# Patient Record
Sex: Male | Born: 1955 | Race: White | Hispanic: No | Marital: Married | Smoking: Never smoker
Health system: Southern US, Community
[De-identification: ages and names within clinical notes are randomized; demographics above are authoritative.]

---

## 2017-07-27 ENCOUNTER — Other Ambulatory Visit: Payer: Self-pay

## 2017-07-27 ENCOUNTER — Emergency Department (HOSPITAL_COMMUNITY)
Admission: EM | Admit: 2017-07-27 | Discharge: 2017-07-28 | Disposition: A | Discharge status: Home / Self Care | Payer: BLUE CROSS/BLUE SHIELD | Attending: Emergency Medicine | Admitting: Emergency Medicine

## 2017-07-27 ENCOUNTER — Emergency Department (HOSPITAL_COMMUNITY): Payer: BLUE CROSS/BLUE SHIELD

## 2017-07-27 DIAGNOSIS — N451 Epididymitis: Secondary | ICD-10-CM | POA: Diagnosis not present

## 2017-07-27 DIAGNOSIS — N50811 Right testicular pain: Secondary | ICD-10-CM | POA: Diagnosis present

## 2017-07-27 DIAGNOSIS — Z960 Presence of urogenital implants: Secondary | ICD-10-CM | POA: Insufficient documentation

## 2017-07-27 DIAGNOSIS — R300 Dysuria: Secondary | ICD-10-CM | POA: Diagnosis not present

## 2017-07-27 DIAGNOSIS — Z931 Gastrostomy status: Secondary | ICD-10-CM | POA: Insufficient documentation

## 2017-07-27 DIAGNOSIS — Z7901 Long term (current) use of anticoagulants: Secondary | ICD-10-CM | POA: Insufficient documentation

## 2017-07-27 DIAGNOSIS — I69320 Aphasia following cerebral infarction: Secondary | ICD-10-CM | POA: Diagnosis not present

## 2017-07-27 LAB — BASIC METABOLIC PANEL
Anion gap: 10 (ref 5–15)
BUN: 14 mg/dL (ref 6–20)
CO2: 20 mmol/L — ABNORMAL LOW (ref 22–32)
Calcium: 8.8 mg/dL — ABNORMAL LOW (ref 8.9–10.3)
Chloride: 107 mmol/L (ref 101–111)
Creatinine, Ser: 0.91 mg/dL (ref 0.61–1.24)
GFR calc Af Amer: >60 mL/min (ref >60)
GFR calc non Af Amer: >60 mL/min (ref >60)
Glucose, Bld: 101 mg/dL — ABNORMAL HIGH (ref 65–99)
Potassium: 3.6 mmol/L (ref 3.5–5.1)
Sodium: 137 mmol/L (ref 135–145)

## 2017-07-27 LAB — CBC WITH DIFFERENTIAL/PLATELET
Basophils Absolute: 0 10*3/uL (ref 0.0–0.1)
Basophils Relative: 0 %
Eosinophils Absolute: 0.4 10*3/uL (ref 0.0–0.7)
Eosinophils Relative: 5 %
HCT: 37.1 % — ABNORMAL LOW (ref 39.0–52.0)
Hemoglobin: 11.8 g/dL — ABNORMAL LOW (ref 13.0–17.0)
Lymphocytes Relative: 18 %
Lymphs Abs: 1.4 10*3/uL (ref 0.7–4.0)
MCH: 25.7 pg — ABNORMAL LOW (ref 26.0–34.0)
MCHC: 31.8 g/dL (ref 30.0–36.0)
MCV: 80.8 fL (ref 78.0–100.0)
Monocytes Absolute: 0.7 10*3/uL (ref 0.1–1.0)
Monocytes Relative: 9 %
Neutro Abs: 5.2 10*3/uL (ref 1.7–7.7)
Neutrophils Relative %: 68 %
Platelets: 251 10*3/uL (ref 150–400)
RBC: 4.59 MIL/uL (ref 4.22–5.81)
RDW: 15 % (ref 11.5–15.5)
WBC: 7.7 10*3/uL (ref 4.0–10.5)

## 2017-07-27 LAB — PROTIME-INR
INR: 2.39
Prothrombin Time: 25.9 seconds — ABNORMAL HIGH (ref 11.4–15.2)

## 2017-07-27 MED ORDER — HYDROCODONE-ACETAMINOPHEN 5-325 MG PO TABS
1.0000 | ORAL_TABLET | Freq: Once | ORAL | Status: AC
  Administered 2017-07-27: 1 via ORAL
  Filled 2017-07-27: qty 1

## 2017-07-27 NOTE — ED Provider Notes (Signed)
[facility]   CSN: [id number] Arrival date & time: 07/27/17  2028     History   Chief Complaint Chief Complaint  Patient presents with  . Groin Pain    HPI Tim Stephens is a 62 y.o. male presents for evaluation of right testicular pain and dysuria that has been ongoing for 2 days.  Wife states that pain worsened tonight, prompting ED visit.  Wife states that patient has been given Tylenol at the rehab center but with no improvement in pain.  He has not taking any other medications.  Wife has not noticed any penile discharge, hematuria.  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  Wife denies any fevers.   EM CAVEAT: Level 5 caveat due to patien'ts aphasia  The history is provided by the spouse. The history is limited by the condition of the patient.    No past medical history on file.  There are no active problems to display for this patient.  The histories are not reviewed yet. Please review them in the "History" navigator section and refresh this SmartLink.     Home Medications    Prior to Admission medications   Medication Sig Start Date End Date Taking? Authorizing Provider  HYDROcodone-acetaminophen (NORCO/VICODIN) 5-325 MG tablet Take 1-2 tablets by mouth every 6 (six) hours as needed. 07/28/17   Maxwell CaulLayden, Lindsey A, PA-C  levofloxacin (LEVAQUIN) 500 MG tablet Take 1 tablet (500 mg total) by mouth daily for 10 days. 07/28/17 08/07/17  Maxwell CaulLayden, Lindsey A, PA-C    Family History No family history on file.  Social History Social History   Tobacco Use  . Smoking status: Not on file  Substance Use Topics  . Alcohol use: Not on file  . Drug use: Not on file     Allergies   Patient has no allergy information on record.   Review of Systems Review of Systems  Unable to perform ROS: Other  Genitourinary: Positive for dysuria and testicular pain.      Physical Exam Updated Vital Signs BP 137/89   Pulse 74   Temp 98.5 F (36.9 C) (Oral)   Resp 18   SpO2 95%   Physical Exam  Constitutional: He appears well-developed and well-nourished.  HENT:  Head: Normocephalic and atraumatic.  Mouth/Throat: Oropharynx is clear and moist and mucous membranes are normal.  Eyes: Conjunctivae, EOM and lids are normal. Pupils are equal, round, and reactive to light.  Neck: Full passive range of motion without pain.  Cardiovascular: Normal rate, regular rhythm, normal heart sounds and normal pulses. Exam reveals no gallop and no friction rub.  No murmur heard. Pulmonary/Chest: Effort normal and breath sounds normal.  Abdominal: Soft. Normal appearance. There is no tenderness. There is no rigidity and no guarding. Hernia confirmed negative in the right inguinal area and confirmed negative in the left inguinal area.  PEG tube in place  Genitourinary: Penis normal. Right testis shows tenderness. Left testis shows no mass, no swelling and no tenderness. Circumcised.  Genitourinary Comments: Family member present for chaperone. Slight erythema noted to the scrotum. Tenderness to palpation of the right testicle.  Foley catheter in place which is actively draining urine.  No signs of hematuria.  Musculoskeletal: Normal range of motion.  Neurological: He is alert.  Skin: Skin is warm and dry. Capillary refill takes less than 2 seconds.  Psychiatric: He has a normal mood and affect.  His speech is normal.  Nursing note and vitals reviewed.    ED Treatments / Results  Labs (all labs ordered are listed, but only abnormal results are displayed) Labs Reviewed  URINALYSIS, ROUTINE W REFLEX MICROSCOPIC - Abnormal; Notable for the following components:      Result Value   APPearance HAZY (*)    Hgb urine dipstick LARGE (*)    Protein, ur 100 (*)    Nitrite POSITIVE (*)    Leukocytes, UA MODERATE (*)    Bacteria, UA MANY (*)    Squamous Epithelial /  LPF 0-5 (*)    All other components within normal limits  BASIC METABOLIC PANEL - Abnormal; Notable for the following components:   CO2 20 (*)    Glucose, Bld 101 (*)    Calcium 8.8 (*)    All other components within normal limits  CBC WITH DIFFERENTIAL/PLATELET - Abnormal; Notable for the following components:   Hemoglobin 11.8 (*)    HCT 37.1 (*)    MCH 25.7 (*)    All other components within normal limits  PROTIME-INR - Abnormal; Notable for the following components:   Prothrombin Time 25.9 (*)    All other components within normal limits    EKG  EKG Interpretation None       Radiology US Scrotum  Result Date: 07/28/2017 CLINICAL DATA:  62 y/o M; genital pain and right testicular pain for 2 days. EXAM: SCROTAL ULTRASOUND DOPPLER ULTRASOUND OF THE TESTICLES TECHNIQUE: Complete ultrasound examination of the testicles, epididymis, and other scrotal structures was performed. Color and spectral Doppler ultrasound were also utilized to evaluate blood flow to the testicles. COMPARISON:  None. FINDINGS: Right testicle Measurements: 5.3 x 2.1 x 3.5 cm. No mass or microlithiasis visualized. Left testicle Measurements: 5.3 x 1.9 x 3.3 cm. No mass or microlithiasis visualized. Tubular structure lateral to the testicle without internal blood flow, likely vas deferens, incidentally noted. Right epididymis: Heterogeneity and enlargement of the tail with increased vascularity. Simple cyst measuring up to 4 mm. Left epididymis: Normal in size and appearance. Simple cyst measuring up to 2 mm. Hydrocele:  Trace left hydrocele. Varicocele:  None. Pulsed Doppler interrogation of both testes demonstrates normal low resistance arterial and venous waveforms bilaterally. IMPRESSION: 1. No findings of testicular torsion or orchitis at this time. 2. Heterogeneity and enlargement of the tail of right epididymis with increased blood flow, likely epididymitis. Electronically Signed   By: Mitzi Hansen  M.D.   On: 07/28/2017 01:21   [place] Art/ven Flow Abd Pelv Doppler  Result Date: 07/28/2017 CLINICAL DATA:  62 y/o M; genital pain and right testicular pain for 2 days. EXAM: SCROTAL ULTRASOUND DOPPLER ULTRASOUND OF THE TESTICLES TECHNIQUE: Complete ultrasound examination of the testicles, epididymis, and other scrotal structures was performed. Color and spectral Doppler ultrasound were also utilized to evaluate blood flow to the testicles. COMPARISON:  None. FINDINGS: Right testicle Measurements: 5.3 x 2.1 x 3.5 cm. No mass or microlithiasis visualized. Left testicle Measurements: 5.3 x 1.9 x 3.3 cm. No mass or microlithiasis visualized. Tubular structure lateral to the testicle without internal blood flow, likely vas deferens, incidentally noted. Right epididymis: Heterogeneity and enlargement of the tail with increased vascularity. Simple cyst measuring up to 4 mm. Left epididymis: Normal in size and appearance. Simple cyst measuring up to 2 mm. Hydrocele:  Trace left hydrocele. Varicocele:  None. Pulsed Doppler interrogation of both testes demonstrates normal low resistance arterial and venous waveforms bilaterally. IMPRESSION: 1. No findings of testicular  torsion or orchitis at this time. 2. Heterogeneity and enlargement of the tail of right epididymis with increased blood flow, likely epididymitis. Electronically Signed   By: Mitzi Hansen M.D.   On: 07/28/2017 01:21    Procedures Procedures (including critical care time)  Medications Ordered in ED Medications  levofloxacin (LEVAQUIN) tablet 500 mg (not administered)  HYDROcodone-acetaminophen (NORCO/VICODIN) 5-325 MG per tablet 1 tablet (1 tablet Oral Given 07/27/17 2327)     Initial Impression / Assessment and Plan / ED Course  I have reviewed the triage vital signs and the nursing notes.  Pertinent labs & imaging results that were available during my care of the patient were reviewed by me and considered in my medical decision  making (see chart for details).     62 year old male past medical history of CVA order right General pain times 3 days.  Wife reports that patient was having some burning with urination and some right groin pain.  She noted today she saw some redness of the right scrotum, prompting ED visit.  Wife denies any fevers. Patient is afebrile, non-toxic appearing, sitting comfortably on examination table. Vital signs reviewed and stable.  On exam, patient does have some erythema noted to the right scrotum.  He does have tenderness to palpation to the right scrotum.  No evidence of hernia.  Scrotum is not swollen or evidence of mass.  Otherwise unremarkable exam.  Plan for basic labs, UA and ultrasound of scrotum.  Consider UTI versus epididymitis versus orchitis.  History/physical exam are not concerning for testicular torsion or hernia.  Labs and imaging reviewed.  BMP shows bicarb of 20.  Otherwise unremarkable.  CBC shows no evidence of leukocytosis.  PT 25.9.  INR is 2.39.  UA shows evidence of nitrates, leukocytes, pyuria.  Ultrasound of scrotum reveals no evidence of testicular torsion but there is concern about right-sided epididymitis.  Discussed results with wife.  Given concerns for UTI and epididmytisi, will plan to start patient on antibiotic.  Discussed with pharmacy regarding antibiotic choice.  Patient is on Coumadin currently.  He recommends doing 500 mg of Levaquin and having patient's INR checked on 1/21.  Updated wife on plan.  She is agreeable.  We will plan to start on antibiotic therapy tonight. Patient is resting comfortably in the ED. Vitals stable. Patient stable for discharge at this time. Wife had ample opportunity for questions and discussion. All patient's questions were answered with full understanding. Strict return precautions discussed. Wife expresses understanding and agreement to plan.     Final Clinical Impressions(s) / ED Diagnoses   Final diagnoses:  Epididymitis     ED Discharge Orders        Ordered    levofloxacin (LEVAQUIN) 500 MG tablet  Daily     07/28/17 0209    HYDROcodone-acetaminophen (NORCO/VICODIN) 5-325 MG tablet  Every 6 hours PRN     07/28/17 0209       Maxwell Caul, PA-C 07/28/17 1458    Eber Hong, MD 07/28/17 1500

## 2017-07-27 NOTE — ED Triage Notes (Signed)
Pt arrives to ED from rehab center with complaints of genital pain since 2 days. EMS reports pt is recovering from stroke in November of 2018 and has residual right sided paralysis and aphasia. Pt is alert and oriented, urinary foley last changed yesterday. Pt placed in position of comfort with bed locked and lowered, call bell in reach.

## 2017-07-27 NOTE — ED Notes (Signed)
Patient transported to Ultrasound 

## 2017-07-28 LAB — URINALYSIS, ROUTINE W REFLEX MICROSCOPIC
Bilirubin Urine: NEGATIVE
Glucose, UA: NEGATIVE mg/dL
Ketones, ur: NEGATIVE mg/dL
Nitrite: POSITIVE — AB
Protein, ur: 100 mg/dL — AB
Specific Gravity, Urine: 1.025 (ref 1.005–1.030)
pH: 6 (ref 5.0–8.0)

## 2017-07-28 MED ORDER — LEVOFLOXACIN 500 MG PO TABS
500.0000 mg | ORAL_TABLET | Freq: Once | ORAL | Status: AC
  Administered 2017-07-28: 500 mg via ORAL
  Filled 2017-07-28: qty 1

## 2017-07-28 MED ORDER — HYDROCODONE-ACETAMINOPHEN 5-325 MG PO TABS: 1.0000 | ORAL_TABLET | Freq: Four times a day (QID) | ORAL | 0 refills | Status: DC | PRN

## 2017-07-28 MED ORDER — LEVOFLOXACIN 500 MG PO TABS: 500.0000 mg | ORAL_TABLET | Freq: Every day | ORAL | 0 refills | Status: AC

## 2017-07-28 NOTE — Discharge Instructions (Signed)
Take antibiotics as directed. Please take all of your antibiotics until finished.  He can take pain medication as directed for severe or break through pain.   As we discussed, his INR will need to be evaluated on 07/31/16.  Follow-up with primary care doctor in the next 24-48 hours for further evaluation.

## 2017-10-05 ENCOUNTER — Ambulatory Visit: Payer: BLUE CROSS/BLUE SHIELD | Admitting: Speech Pathology

## 2017-10-05 ENCOUNTER — Ambulatory Visit: Payer: BLUE CROSS/BLUE SHIELD | Attending: Family Medicine | Admitting: Physical Therapy

## 2017-10-05 ENCOUNTER — Encounter: Payer: Self-pay | Admitting: Speech Pathology

## 2017-10-05 DIAGNOSIS — R293 Abnormal posture: Secondary | ICD-10-CM | POA: Insufficient documentation

## 2017-10-05 DIAGNOSIS — R4701 Aphasia: Secondary | ICD-10-CM | POA: Diagnosis present

## 2017-10-05 DIAGNOSIS — R2681 Unsteadiness on feet: Secondary | ICD-10-CM | POA: Diagnosis present

## 2017-10-05 DIAGNOSIS — R482 Apraxia: Secondary | ICD-10-CM | POA: Insufficient documentation

## 2017-10-05 DIAGNOSIS — R2689 Other abnormalities of gait and mobility: Secondary | ICD-10-CM | POA: Diagnosis present

## 2017-10-05 DIAGNOSIS — M6281 Muscle weakness (generalized): Secondary | ICD-10-CM | POA: Diagnosis present

## 2017-10-05 NOTE — Patient Instructions (Signed)
  Continue to practicing printing on the while board name, family names  Continue practicing sounds, blow, kiss, horse, whistle  Continue counting to 20, days of week, months  Try some reading use body parts, put items or pictures on the table  Also use verbal descriptions - point to the one that we tell time with (use a short description) for auditory comprehension or write description for reading comprehension  Happy birthday, Jingle bells, Take me out to the Ballgame, row row your boat, Twinkle,

## 2017-10-05 NOTE — Therapy (Signed)
[facility] [address] [place], [place], [address] Phone: [phone]   Fax:  [phone]  Speech Language Pathology Evaluation  Patient Details  Name: Tim Stephens MRN: [id number] Date of Birth: [birth year] Referring Provider: Dr. Jenita Seashore   Encounter Date: 10/05/2017  End of Session - 10/05/17 1218    Visit Number  1    Number of Visits  34    Date for SLP Re-Evaluation  12/29/17    SLP Start Time  1104    SLP Stop Time   1155    SLP Time Calculation (min)  51 min    Activity Tolerance  Patient tolerated treatment well       History reviewed. No pertinent past medical history.  History reviewed. No pertinent surgical history.  There were no vitals filed for this visit.      SLP Evaluation [facility] - 10/05/17 1202      General Information   HPI  Tim Stephens is a 62 year old right hand dominant male with past medical history significant for hypertension and hyperlipidemia who was admitted to [facility] on 05/30/2017 with a aphasia and right hemibody weakness. CTA was obtained and revealed left M1 cutoff. He was taken to IR by Dr. Lionel December for cerebral angiography with mechanical thrombectomy. The patient's acute hospital course was complicated by worsening cytotoxic edema as well areas of hemorrhagic conversion with left to right midline shift. He was taken to the OR on 06/01/2017 for decompressive left hemicraniectomy. [name] He was found to have a right peroneal, right posterior tibial, right gastrocnemius,left basilic, and left axillary venous thrombi. He then developed totally occluding superficial thrombophlebitis of the left cephalic vein and right cephalic vein. Venous thrombus then progressed in the left upper arm to the left brachial vein. The patient was not anticoagulated due to recent hemorrhagic conversion of stroke. He underwent serial scans to evaluate for progression of venous thrombosis. PEG was  placed by Dr. Allayne Butcher on 06/15/2017. There was progression of right lower extremity DVT from the posterior tibial vein to the right common femoral vein. Vascular surgery was consulted and placed an inferior vena cava filter on 06/18/2017.   He was d/c'd from acute care to  [facility] in December 2018, and was d/c'd from inpt rehab on  07/30/17. At that time, pt was placed in SNF for further therapies, d/c'd 08/22/17 with HH PT/OT/ST. At this time, pt is on regular diet/thin liquids. Plans to repelace skull in next few weeks. Pt with helmet.    Mobility Status  wheelchair      Balance Screen   Has the patient fallen in the past 6 months  No    Has the patient had a decrease in activity level because of a fear of falling?   Yes    Is the patient reluctant to leave their home because of a fear of falling?   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      Auditory Comprehension   Overall Auditory Comprehension  Impaired    Yes/No Questions  Impaired    Basic Immediate Environment Questions  75-100% accurate  Complex Questions  50-74% accurate    Commands  Impaired    One Step Basic Commands  50-74% accurate    Two Step Basic Commands  50-74% accurate    Conversation  Simple    EffectiveTechniques  Extra processing time;Pausing;Repetition;Slowed speech;Stressing words;Visual/Gestural cues      Reading Comprehension   Reading Status  Impaired    Word level  76-100% accurate    Sentence Level  26-50% accurate    Paragraph Level  Not tested      Expression   Primary Mode of Expression  Verbal      Verbal Expression   Overall Verbal Expression  Impaired    Initiation  Impaired    Automatic Speech  Counting     Level of Generative/Spontaneous Verbalization  Word    Repetition  Impaired    Level of Impairment  Word level    Naming  Impairment    Responsive  Not tested    Confrontation  0-24% accurate    Convergent  0-24% accurate    Divergent  Not tested    Verbal Errors  Inconsistent;Perseveration;Neologisms;Aware of errors    Pragmatics  No impairment    Effective Techniques  Phonemic cues;Articulatory cues;Written cues;Sentence completion      Written Expression   Dominant Hand  Right    Written Expression  Exceptions to [facility]    Trace Ability  Letter;Word    Copy Ability  Word    Dictation Ability  Word name only      Oral Motor/Sensory Function   Overall Oral Motor/Sensory Function  Appears within functional limits for tasks assessed    Overall Oral Motor/Sensory Function  assessment affected by oral and verbal apraxia. Pt eating without oral phase difficulites      Motor Speech   Overall Motor Speech  Impaired    Respiration  Within functional limits    Phonation  Normal    Resonance  Within functional limits    Articulation  Impaired    Level of Impairment  Word    Intelligibility  Intelligibility reduced    Word  0-24% accurate    Phrase  0-24% accurate    Sentence  Not tested    Motor Planning  Impaired    Level of Impairment  Word    Motor Speech Errors  Groping for words;Inconsistent perseveration    Effective Techniques  Slow rate                      SLP Education - 10/05/17 1217    Education provided  Yes    Education Details  Home acticvities for speech and language; possible use of AAC device, auditory comprehension impairment    Person(s) Educated  Patient;Spouse    Methods  Explanation;Demonstration;Verbal cues;Handout    Comprehension  Verbalized understanding;Verbal cues required;Need further instruction       SLP Short Term Goals - 10/05/17 1221      SLP SHORT TERM GOAL #1   Title  Pt will ID object f:5 to sentence description with 90%  accuracy and occasional min A    Time  6    Period  Weeks    Status  New      SLP SHORT TERM GOAL #2   Title  Pt will complete automatic speech tasks with 70% accuracy and visual cues over 3 sessions    Time  6    Period  Weeks    Status  New  SLP SHORT TERM GOAL #3   Title  Pt will utilize multimodal communication and AAC to communicate wants and answer questions with 70% accuracy and occasional min A over 3 sessions    Time  6    Period  Weeks    Status  New      SLP SHORT TERM GOAL #4   Title  Pt will write/type at word level with 80% accuracy and occasional min A over 2 sessions    Time  6    Period  Weeks    Status  New       SLP Long Term Goals - 10/05/17 1543      SLP LONG TERM GOAL #1   Title  Pt will comprehend mildly complex conversation over 8 minute conversation with appropriate responses (mulitmodal) and 2 or less requests for repetition    Time  12    Period  Weeks    Status  New      SLP LONG TERM GOAL #2   Title  Pt will utilize multimodal communication/ACC to produce 2 word response to conversation/question 8/10x with occasional min A    Time  12    Period  Weeks    Status  New      SLP LONG TERM GOAL #3   Title  Pt will write 2 word phrases with 80% accuracy and occasional min A    Time  12    Period  Weeks    Status  New      SLP LONG TERM GOAL #4   Title  Pt will produce 1-2 syllable words in phrase completion task with 70% accuracy and occassional mod A    Time  12    Period  Weeks    Status  New       Plan - 10/05/17 1534    Clinical Impression Statement  Tim Stephens, 62 y.o. male hospitalized from 05/30/17 to 08/22/17 due to large left CVA. 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 Auditory comprehension is intact to simple yes/no questions and simple 1 step commands. He was 60% accurate with complex yes/no questions and frequently requested  repetition. Reading comprehension intact to word level 70% accuracy. Phrase level reading was 0/3. Automatic speech is intact 60% to count 1-10, with perseveration, articulatory errors and halting. Pt did not complete days of the week in unison, with visual and placement cues. Repetition in is impaired at word level. Naming difficulty to assess due to verbal apraxia, however confrontation naming was less than 50%. Sentence completion cues, phonemic cues, semantic cues rarely successful. Written expression intact to copying family names, writing his name with extended time. When asked how they are communicating at home, his wife responded "I'm reading his mind and he gets frustrated" I recommend skilled ST to maximize communication, at this time, will continue traditional therapy and ongoing assessment for AAC device/multimodal communication.     Duration  -- 12 weeks or total of 35 visits - see frerquency comment    Treatment/Interventions  Language facilitation;Environmental controls;Cueing hierarchy;Oral motor exercises;SLP instruction and feedback;Compensatory strategies;Functional tasks;Cognitive reorganization;Internal/external aids;Multimodal communcation approach;Patient/family education    Potential to Achieve Goals  Fair    Potential Considerations  Severity of impairments       Patient will benefit from skilled therapeutic intervention in order to improve the following deficits and impairments:   Aphasia - Plan: SLP plan of  care cert/re-cert  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   Melany Wiesman, Radene JourneyLaura Ann MS, CCC-SLP 10/05/2017, 3:55 PM  [facility] [address] [place], [place], [address] Phone: [phone]   Fax:  [phone]  Name: Tim BrackenJames M Stephens MRN: [id number] Date of Birth: 03/08/1956

## 2017-10-06 ENCOUNTER — Encounter: Payer: Self-pay | Admitting: Physical Therapy

## 2017-10-06 NOTE — Therapy (Signed)
[facility] [address] [place], [place], [address] Phone: [phone]   Fax:  [phone]  Physical Therapy Evaluation  Patient Details  Name: Tim Stephens MRN: [id number] Date of Birth: 1956/06/29 Referring Provider: Jenita Seashore MD   Encounter Date: 10/05/2017  PT End of Session - 10/06/17 0826    Visit Number  1    Number of Visits  33    Date for PT Re-Evaluation  01/03/18    Authorization Type  [employer]-30 visit limit for each-awaiting complete PT auth    PT Start Time  1017    PT Stop Time  1102    PT Time Calculation (min)  45 min    Equipment Utilized During Treatment  Gait belt    Activity Tolerance  Patient tolerated treatment well    Behavior During Therapy  [facility] for tasks assessed/performed       History reviewed. No pertinent past medical history.  History reviewed. No pertinent surgical history.  There were no vitals filed for this visit.   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  He was in ICU for several weeks, with R side affected hemiplegia.  He had craniectomy on L side of skull and wears helmet for therapy.  He has progressed to walking with hemiwalker for about 25 ft.      Patient is accompained by:  Family member wife    Pertinent History  Tim Stephens is a 15 year old right hand dominant male with past medical history significant for hypertension and hyperlipidemia who was admitted to [facility] on 05/30/2017 with a aphasia and right hemibody weakness. CTA was obtained and revealed left M1 cutoff. He was taken to IR by Dr. Lionel December for cerebral angiography with mechanical thrombectomy. The patient's acute hospital course was complicated by worsening cytotoxic edema as well areas of hemorrhagic conversion with left to right midline shift. He was taken to the OR on 06/01/2017 for decompressive left hemicraniectomy. [name] He was  found to have a right peroneal, right posterior tibial, right gastrocnemius,left basilic, and left axillary venous thrombi. He then developed totally occluding superficial thrombophlebitis of the left cephalic vein and right cephalic vein. Venous thrombus then progressed in the left upper arm to the left brachial vein. The patient was not anticoagulated due to recent hemorrhagic conversion of stroke. He underwent serial scans to evaluate for progression of venous thrombosis. PEG was placed by Dr. Allayne Butcher on 06/15/2017. There was progression of right lower extremity DVT from the posterior tibial vein to the right common femoral vein. Vascular surgery was consulted and placed an inferior vena cava filter on 06/18/2017. expressive aphasia, verbal apraxia; therapy at [facility] January 2019, then [facility] rehab for 4 weeks, then [facility] therapy PT, OT, speech discharged 09/21/17    Patient Stated Goals  Per wife:  to get strong enough to get upstairs, and use the bathroom on his own.    Currently in Pain?  No/denies         [facility] PT Assessment - 10/06/17 0001      Assessment   Medical Diagnosis  CVA with R hemiparesis    Referring Provider  Jenita Seashore MD    Onset Date/Surgical Date  05/30/17    Hand Dominance  Right      Precautions   Precautions  Fall;Other (comment)    Precaution Comments  Helmet with therapy and out  of bed activities; recommended to check BP prior to standing activities due to wife reported possible seizure history; DVT R calf (on Coumadin) and s/p IVC filter placement Regular diet, thin liquids      Balance Screen   Has the patient fallen in the past 6 months  No    Has the patient had a decrease in activity level because of a fear of falling?   No    Is the patient reluctant to leave their home because of a fear of falling?   No      Home Public house manager residence    Living Arrangements  Spouse/significant other Wife providing 24/7 care     Available Help at Discharge  Family    Type of Home  House    Home Access  Ramped entrance through garage    Home Layout  Two level;1/2 bath on main level;Bed/bath upstairs    Home Equipment  Hospital bed;Wheelchair - Optician, dispensing; uses urinal      Prior Function   Level of Independence  Independent    Vocation  Full time employment    Radiation protection practitioner, In charge of laboratories    Leisure  Enjoyed walking around neighborhood, fishing, hunting      Observation/Other Assessments   Focus on Therapeutic Outcomes (FOTO)   NA-Staff did not capture      Sensation   Light Touch  Impaired by gross assessment    Proprioception  Appears Intact      Posture/Postural Control   Posture/Postural Control  Postural limitations    Postural Limitations  Rounded Shoulders      Strength   Overall Strength  Deficits    Right Hip Flexion  3-/5    Left Hip Flexion  5/5    Right Knee Flexion  3-/5    Right Knee Extension  3-/5    Left Knee Flexion  5/5    Left Knee Extension  5/5    Right Ankle Dorsiflexion  1/5    Left Ankle Dorsiflexion  5/5      Bed Mobility   Bed Mobility  Not assessed Wife reports mod independent bed mobility      Transfers   Transfers  Sit to Stand;Stand to Sit    Sit to Stand  4: Min assist;With upper extremity assist;From chair/3-in-1;With armrests from wheelchair    Sit to Stand Details  Verbal cues for sequencing;Verbal cues for technique VCs for hand placement    Stand to Sit  4: Min assist;With upper extremity assist;With armrests;To chair/3-in-1 to wheelchair    Stand to Sit Details (indicate cue type and reason)  Verbal cues for sequencing;Verbal cues for technique VCs for hand placement.      Ambulation/Gait   Ambulation/Gait  Yes    Ambulation/Gait Assistance  4: Min guard    Ambulation Distance (Feet)  25 Feet    Assistive device  Hemi-walker    Gait Pattern  Step-to pattern;Decreased step length - right;Decreased  stance time - right;Decreased dorsiflexion - right;Right genu recurvatum;Lateral trunk lean to left;Poor foot clearance - right    Gait velocity  Unable to assess       Balance   Balance Assessed  Yes      Static Sitting Balance   Static Sitting - Balance Support  No upper extremity supported    Static Sitting - Level of Assistance  6: Modified independent (Device/Increase time)    Static Sitting -  Comment/# of Minutes  30-60 sec      Static Standing Balance   Static Standing - Balance Support  No upper extremity supported    Static Standing - Level of Assistance  5: Stand by assistance    Static Standing - Comment/# of Minutes  30 seconds with very close supervision      Standardized Balance Assessment   Standardized Balance Assessment  Berg Balance Test      Berg Balance Test   Sit to Stand  Able to stand using hands after several tries    Standing Unsupported  Needs several tries to stand 30 seconds unsupported    Sitting with Back Unsupported but Feet Supported on Floor or Stool  Able to sit 2 minutes under supervision    Stand to Sit  Controls descent by using hands    Transfers  Needs two people to assist of supervise to be safe    Standing Unsupported with Eyes Closed  Needs help to keep from falling    Standing Ubsupported with Feet Together  Needs help to attain position and unable to hold for 15 seconds    From Standing, Reach Forward with Outstretched Arm  Loses balance while trying/requires external support    From Standing Position, Pick up Object from Floor  Unable to try/needs assist to keep balance    From Standing Position, Turn to Look Behind Over each Shoulder  Needs assist to keep from losing balance and falling    Turn 360 Degrees  Needs assistance while turning    Standing Unsupported, Alternately Place Feet on Step/Stool  Needs assistance to keep from falling or unable to try    Standing Unsupported, One Foot in [employer] balance while stepping or standing     Standing on One Leg  Unable to try or needs assist to prevent fall    Total Score  9    Berg comment:  Scores <45/56 indicate increased fall risk              No data recorded  Objective measurements completed on examination: See above findings.                PT Short Term Goals - 10/06/17 0841      PT SHORT TERM GOAL #1   Title  Pt will perform HEP with wife's supervision for improved balance, strength, gait.  TARGET 11/03/17    Time  4    Period  Weeks    Status  New    Target Date  11/03/17      PT SHORT TERM GOAL #2   Title  Pt will perform sit to stand with supervision, for improved transfer efficiency and safety with gait.    Time  4    Period  Weeks    Status  New    Target Date  11/03/17      PT SHORT TERM GOAL #3   Title  Pt will ambulate at least 100 ft using hemiwalker with minimal assistance for improved gait safety and household mobility.    Time  4    Period  Weeks    Status  New    Target Date  11/03/17      PT SHORT TERM GOAL #4   Title  Stand pivot transfer to be assessed, with goal to be written as appropriate (as wife is currently assisting at home-did not assess at eval)    Time  4    Period  Weeks    Status  New    Target Date  11/03/17      PT SHORT TERM GOAL #5   Title  Pt/wife will verbalize understanding of fall prevention in home environment.    Time  4    Period  Weeks    Status  New    Target Date  11/03/17      Additional Short Term Goals   Additional Short Term Goals  Yes      PT SHORT TERM GOAL #6   Title  Berg score to increase by at least 8 points, for improved standing balance and decreased fall risk.    Time  4    Period  Weeks    Status  New    Target Date  11/03/17        PT Long Term Goals - 10/06/17 1317      PT LONG TERM GOAL #1   Title  Pt/wife will verbalize plans for continued community fitness upon d/c from PT.  TARGET 12/29/17    Time  12    Period  Weeks    Status  New    Target Date   12/29/17      PT LONG TERM GOAL #2   Title  Pt will perform sit to stand transfers modified independently, for improved safety and independence with transfers.    Time  12    Period  Weeks    Status  New    Target Date  12/29/17      PT LONG TERM GOAL #3   Title  Pt will ambulate at least 500 ft using least restrictive assistive device, with supervision, for improved safety and independence with gait.    Time  12    Period  Weeks    Status  New    Target Date  12/29/17      PT LONG TERM GOAL #4   Title  GAit velocity to be assessed, with goal to be set as appropriate.    Time  12    Period  Weeks    Status  New    Target Date  12/29/17      PT LONG TERM GOAL #5   Title  Pt will negotiate 12 steps using handrail, with supervision, for improved safety and independence with stair negotiation for home.    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  [facility], followed by [facility], then HHPT.  PMH included HTN, hyperlipidemia.  Pt presents with R hemiplegia, decreased muscle strength RLE and trunk, decreased balance, decreased independence with transfers, decreased independence with gait, decreased safety awareness, aphasia and verbal apraxia.  Prior to CVA, pt was independent and worked as Herbalistchemist and supervisor of labs; currently, pt is requiring 24-hr care and supervision.  Pt would benefit from skilled PT to address the above stated deficits and to decrease fall risk, improve functional mobility and independence.    History and Personal Factors relevant to plan of care:  PMH  includes HTN, hyperlipidemia;  significant hospital course following CVA; >3 systems involved; current DVT on prophylaxis and IVC filter placement; s/p hemicraniectomy awaiting surgical flap replacement     Clinical Presentation  Unstable    Clinical Presentation due to:  significant hospital course, fall risk and current co-morbidities    Clinical Decision Making  High    Rehab Potential  Good    Clinical Impairments Affecting Rehab Potential  Good family support; severity of deficits    PT Frequency  Other (comment) 2x/wk for 4 weeks, then 3x/wk for 8 weeks    PT Duration  12 weeks    PT Treatment/Interventions  ADLs/Self Care Home Management;DME Instruction;Gait training;Stair training;Functional mobility training;Therapeutic activities;Therapeutic exercise;Balance training;Orthotic Fit/Training;Patient/family education;Neuromuscular re-education    PT Next Visit Plan  Initiate HEP for trunk, lower extremity strengthening, transfer, and gait training    Consulted and Agree with Plan of Care  Patient;Family member/caregiver    Family Member Consulted  wife       Patient will benefit from skilled therapeutic intervention in order to improve the following deficits and impairments:  Abnormal gait, Decreased activity tolerance, Decreased balance, Decreased knowledge of precautions, Decreased endurance, Decreased knowledge of use of DME, Decreased mobility, Decreased safety awareness, Difficulty walking, Decreased strength, Impaired tone, Postural dysfunction  Visit Diagnosis: Muscle weakness (generalized)  Unsteadiness on feet  Other abnormalities of gait and mobility  Abnormal posture     Problem List There are no active problems to display for this patient.   Gean Maidens 10/06/2017, 1:29 PM  Gean Maidens., PT   [facility] [address] [place], [place], [address] Phone: [phone]   Fax:  [phone]  Name: Tim Stephens MRN:  [id number] Date of Birth: 01/06/1956

## 2017-10-09 ENCOUNTER — Encounter (HOSPITAL_COMMUNITY): Payer: Self-pay | Admitting: Emergency Medicine

## 2017-10-09 ENCOUNTER — Encounter: Payer: Self-pay | Admitting: Occupational Therapy

## 2017-10-09 ENCOUNTER — Ambulatory Visit: Payer: BLUE CROSS/BLUE SHIELD | Attending: Family Medicine | Admitting: Occupational Therapy

## 2017-10-09 ENCOUNTER — Emergency Department (HOSPITAL_COMMUNITY)
Admission: EM | Admit: 2017-10-09 | Discharge: 2017-10-09 | Disposition: A | Discharge status: Home / Self Care | Payer: BLUE CROSS/BLUE SHIELD | Attending: Emergency Medicine | Admitting: Emergency Medicine

## 2017-10-09 ENCOUNTER — Emergency Department (HOSPITAL_COMMUNITY): Payer: BLUE CROSS/BLUE SHIELD

## 2017-10-09 DIAGNOSIS — R569 Unspecified convulsions: Secondary | ICD-10-CM | POA: Diagnosis present

## 2017-10-09 DIAGNOSIS — R2681 Unsteadiness on feet: Secondary | ICD-10-CM | POA: Diagnosis present

## 2017-10-09 DIAGNOSIS — G40909 Epilepsy, unspecified, not intractable, without status epilepticus: Secondary | ICD-10-CM | POA: Diagnosis not present

## 2017-10-09 DIAGNOSIS — I69318 Other symptoms and signs involving cognitive functions following cerebral infarction: Secondary | ICD-10-CM | POA: Diagnosis present

## 2017-10-09 DIAGNOSIS — R482 Apraxia: Secondary | ICD-10-CM

## 2017-10-09 DIAGNOSIS — R293 Abnormal posture: Secondary | ICD-10-CM

## 2017-10-09 DIAGNOSIS — Z79899 Other long term (current) drug therapy: Secondary | ICD-10-CM | POA: Diagnosis not present

## 2017-10-09 DIAGNOSIS — Z7901 Long term (current) use of anticoagulants: Secondary | ICD-10-CM | POA: Insufficient documentation

## 2017-10-09 DIAGNOSIS — R29818 Other symptoms and signs involving the nervous system: Secondary | ICD-10-CM | POA: Insufficient documentation

## 2017-10-09 DIAGNOSIS — M6281 Muscle weakness (generalized): Secondary | ICD-10-CM | POA: Diagnosis present

## 2017-10-09 DIAGNOSIS — R29898 Other symptoms and signs involving the musculoskeletal system: Secondary | ICD-10-CM | POA: Diagnosis present

## 2017-10-09 DIAGNOSIS — I69351 Hemiplegia and hemiparesis following cerebral infarction affecting right dominant side: Secondary | ICD-10-CM | POA: Insufficient documentation

## 2017-10-09 LAB — PROTIME-INR
INR: 2.85
Prothrombin Time: 29.7 seconds — ABNORMAL HIGH (ref 11.4–15.2)

## 2017-10-09 LAB — I-STAT CHEM 8, ED
BUN: 15 mg/dL (ref 6–20)
Calcium, Ion: 1.07 mmol/L — ABNORMAL LOW (ref 1.15–1.40)
Chloride: 104 mmol/L (ref 101–111)
Creatinine, Ser: 1 mg/dL (ref 0.61–1.24)
Glucose, Bld: 97 mg/dL (ref 65–99)
HCT: 45 % (ref 39.0–52.0)
Hemoglobin: 15.3 g/dL (ref 13.0–17.0)
Potassium: 3.7 mmol/L (ref 3.5–5.1)
Sodium: 140 mmol/L (ref 135–145)
TCO2: 24 mmol/L (ref 22–32)

## 2017-10-09 NOTE — ED Notes (Signed)
Pt verbalized understanding discharge instructions and denies any further needs or questions at this time. VS stable, ambulatory and steady gait.   

## 2017-10-09 NOTE — ED Notes (Signed)
Requisition for PT-INR sent to main lab. No sunquest labels available for hall pt

## 2017-10-09 NOTE — ED Provider Notes (Signed)
[facility] EMERGENCY DEPARTMENT Provider Note   CSN: [id number] Arrival date & time: 10/09/17  1246     History   Chief Complaint Chief Complaint  Patient presents with  . Seizures    HPI Tim Stephens is a 62 y.o. male.  Patient had generalized seizure today approximately 1 hour prior to arrival.  Lasted 3 minutes according to his wife followed by drowsiness lasting several minutes.  He is now at baseline.  He denies pain anywhere.  He looks at his normal self per his wife and feels normal.  No treatment prior to coming here.  Denies noncompliance with Keppra.  Nothing makes symptoms better or worse.  No other associated symptoms  HPI  History reviewed. No pertinent past medical history.  There are no active problems to display for this patient.   History reviewed. No pertinent surgical history.      Home Medications    Prior to Admission medications   Medication Sig Start Date End Date Taking? Authorizing Provider  acetaminophen (TYLENOL) 500 MG tablet Take 500 mg by mouth every 6 (six) hours as needed for mild pain.    [provider]  amLODipine (NORVASC) 5 MG tablet Take 5 mg by mouth daily.    [provider]  atorvastatin (LIPITOR) 40 MG tablet Take 40 mg by mouth daily.    [provider]  diclofenac sodium (VOLTAREN) 1 % GEL Apply 2 g topically 3 (three) times daily.    [provider]  gabapentin (NEURONTIN) 300 MG capsule Take 300 mg by mouth every 8 (eight) hours.    [provider]  HYDROcodone-acetaminophen (NORCO/VICODIN) 5-325 MG tablet Take 1-2 tablets by mouth every 6 (six) hours as needed. Patient not taking: Reported on 10/06/2017 07/28/17   Maxwell Caul, PA-C  levETIRAcetam (KEPPRA) 500 MG tablet Take 500 mg by mouth 2 (two) times daily.    [provider]  miconazole (MICOTIN) 2 % cream Apply 1 application topically 2 (two) times daily.    [provider]  Multiple  Vitamin (MULTIVITAMIN WITH MINERALS) TABS tablet Take 1 tablet by mouth daily.    [provider]  ondansetron (ZOFRAN) 40 MG/20ML SOLN injection Inject 4 mg into the vein every 8 (eight) hours as needed for nausea or vomiting.    [provider]  polyethylene glycol (MIRALAX / GLYCOLAX) packet Take 17 g by mouth daily.    [provider]  senna-docusate (SENOKOT-S) 8.6-50 MG tablet Take 2 tablets by mouth 2 (two) times daily.    [provider]  sodium chloride 1 g tablet Take 2 g by mouth 3 (three) times daily.    [provider]  tamsulosin (FLOMAX) 0.4 MG CAPS capsule Take 0.4 mg by mouth at bedtime.    [provider]  traZODone (DESYREL) 50 MG tablet Take 50 mg by mouth at bedtime.    [provider]  warfarin (COUMADIN) 1 MG tablet Take 8.5 mg by mouth daily.    [provider]  warfarin (COUMADIN) 7.5 MG tablet Take 8.5 mg by mouth daily.    [provider]    Family History No family history on file.  Social History Social History   Tobacco Use  . Smoking status: Never Smoker  . Smokeless tobacco: Never Used  Substance Use Topics  . Alcohol use: Not on file  . Drug use: Not on file     Allergies   Patient has no known allergies.  Review of Systems Review of Systems  Musculoskeletal: Positive for gait problem.       Wheelchair-bound  Neurological: Positive for seizures and weakness.       Right-sided weakness  All other systems reviewed and are negative.    Physical Exam Updated Vital Signs BP 95/76 (BP Location: Left Arm)   Pulse 71   Temp 98 F (36.7 C) (Oral)   Resp 14   SpO2 98%   Physical Exam  Constitutional: He appears well-developed and well-nourished.  Chronically ill-appearing  HENT:  Head: Normocephalic and atraumatic.  No facial asymmetry  Eyes: Pupils are equal, round, and reactive to light. Conjunctivae are normal.  Neck: Neck supple. No tracheal deviation  present. No thyromegaly present.  Cardiovascular: Normal rate and regular rhythm.  No murmur heard. Pulmonary/Chest: Effort normal and breath sounds normal.  Abdominal: Soft. Bowel sounds are normal. He exhibits no distension. There is no tenderness.  Musculoskeletal: Normal range of motion. He exhibits no edema or tenderness.  Neurological: He is alert. Coordination normal.  Moves all extremities.  Left upper extremity in sling.  Follow simple commands.  Glasgow Coma Score 15 cranial nerves II through XII grossly intact  Skin: Skin is warm and dry. No rash noted.  Psychiatric: He has a normal mood and affect.  Nursing note and vitals reviewed.    ED Treatments / Results  Labs (all labs ordered are listed, but only abnormal results are displayed) Labs Reviewed  PROTIME-INR  I-STAT CHEM 8, ED    EKG None  Radiology No results found.  Procedures Procedures (including critical care time)  Medications Ordered in ED Medications - No data to display   Initial Impression / Assessment and Plan / ED Course  I have reviewed the triage vital signs and the nursing notes.  Pertinent labs & imaging results that were available during my care of the patient were reviewed by me and considered in my medical decision making (see chart for details).   3:55 PM patient alert no distress Glasgow Coma Score 15.  Looks well.  Case [facility] Renold DonVu[facility].  Plan increase Keppra to 750 mg twice daily.  Keep next scheduled appointment with neurologist Lab work [facility] for therapeutic INR.  Electrolytes normal. Final Clinical Impressions(s) / ED Diagnoses  Diagnosis seizure disorder Final diagnoses:  None    ED Discharge Orders    None       Doug SouJacubowitz, Adonnis Salceda, MD 10/09/17 1559

## 2017-10-09 NOTE — Therapy (Signed)
[facility] [address] [place], [place], [address] Phone: [phone]   Fax:  [phone]  Occupational Therapy Evaluation  Patient Details  Name: Tim Stephens MRN: [id number] Date of Birth: 11/28/1955 Referring Provider: Jenita SeashoreSamuel Kelly, MD   Encounter Date: 10/09/2017  OT End of Session - 10/09/17 1807    Visit Number  1    Number of Visits  30    Date for OT Re-Evaluation  01/15/18    Authorization Type  BCBS    Authorization Time Period  30 visits OT - await approval    OT Start Time  1103    OT Stop Time  1145 at end of session pt had seizure see note    OT Time Calculation (min)  42 min    Activity Tolerance  Treatment limited secondary to medical complications (Comment) pt had seizure at end of session       History reviewed. No pertinent past medical history.  History reviewed. No pertinent surgical history.  There were no vitals filed for this visit.  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  No/denies        [facility] OT Assessment - 10/09/17 0001      Assessment   Medical Diagnosis  CVA with R hemiparesis    Referring Provider  Tim SeashoreSamuel Kelly, MD    Onset Date/Surgical Date  05/30/17    Hand Dominance  Right      Precautions   Precautions  Fall    Precaution Comments  Helmet with all out of bed activities, check BP prior to standing due to wife reporting possible seizure activity, h/o of multiple clots/DVT now with IVC filter and on Coumadin.  regular diet, thin liquids      Restrictions   Weight Bearing Restrictions   No      Balance Screen   Has the patient fallen in the past 6 months  No Pt currently seeing PT      Home  Environment   Family/patient expects to be discharged to:  Private residence    Living Arrangements  Spouse/significant other in laws temporarily to assist    Available Help at Discharge  Available 24 hours/day    Type of Home  House    Home Access  Ramped entrance    Home Layout  Two level    Bathroom Shower/Tub  Walk-in Shower glass door    Additional Comments  Pt only has half bath on first floor and has step up to enter half bath.  3 or 4 steps to toilet.  Pt currently can't access to bathroom so tries to use urinal if time otherwise a brief for both urine and bowel. Wife reports that pt usually goes one time per day.       Prior Function   Level of Independence  Independent with basic ADLs    Vocation  Full time employment    Radiation protection practitionerVocation Requirements  Lab chemist, in charge of labs.    Leisure  fishing, hunting, walking in neighborhood  ADL   Eating/Feeding  Minimal assistance assist for cutting food; set up for towel due to spillage    Grooming  Moderate assistance    Upper Body Bathing  + 1 Total asssestance    Lower Body Bathing  + 1 Total assistance    Upper Body Dressing  + 1 Total assistance    Lower Body Dressing  +1 Total aassistance    Toilet Transfer  Minimal assistance    Toileting -  Hygiene  + 1 Total assistance    Tub/Shower Transfer  -- see above      IADL   Shopping  Completely unable to shop    Light Housekeeping  Does not participate in any housekeeping tasks    Meal Prep  Needs to have meals prepared and served    [employer] on family or friends for transportation    Medication Management  Is not capable of dispensing or managing own medication    Financial Management  Dependent      Mobility   Mobility Status  Needs assist      Written Expression   Dominant Hand  Right      Vision - History   Baseline Vision  Wears glasses  all the time    Additional Comments  Pt and wife deny visual changes      Activity Tolerance   Activity Tolerance  Tolerates < min activity, no significant change in vital signs      Cognition   Overall Cognitive Status  Impaired/Different from baseline    Memory  Impaired    Awareness  Impaired    Problem Solving  Impaired    Executive Function  Reasoning;Sequencing;Decision Making;Initiating    Reasoning  Impaired    Sequencing  Impaired    Decision Making  Impaired    Initiating  Impaired    Cognition Comments  pt able to attend to this therapist in busy clinic and follow simple one step commands. Pt able to identify that he has memory deficits. Wife reports that pt has attempted to get up by himself.       Posture/Postural Control   Posture/Postural Control  Postural limitations    Postural Limitations  -- to be further assessed      Sensation   Light Touch  Impaired by gross assessment appears absent in RUE    Additional Comments  difficulty to assess further due to cognition and language deficits. Wife reports pt often lays on arm or leaves arm behind and appears unaware unless cued.      Coordination   Gross Motor Movements are Fluid and Coordinated  No    Fine Motor Movements are Fluid and Coordinated  No    Other  Unable to particpate in any standardized tests for UE      Perception   Perception  Impaired to be further assessed      Praxis   Praxis  Impaired      Edema   Edema  moderate in R hand      Tone   Assessment Location  Right Upper Extremity      ROM / Strength   AROM / PROM / Strength  AROM;PROM;Strength      AROM   Overall AROM   Deficits    Overall AROM Comments  no active ROM in RUE      PROM   Overall PROM   Deficits    Overall PROM Comments  RUE: shoulder flexion to  70*, abduction to 55*, ER to 5* then pt has pain. Pt unable to rate pain due to aphasia      Strength   Overall Strength  Deficits    Overall Strength Comments  No isolated  active movement in RUE. LUE WFL's      Hand Function   Right Hand Gross Grasp  Impaired    Left Hand Gross Grasp  Functional    Comment  Pt has no movement in R hand at all.      RUE Tone   RUE Tone  Hypotonic pt with very beginning of "catch" with elbow extension       Additional information: at end of session pt had seizure lasting approximately 3 minutes followed by vomiting.  Ambulance called and pt transported to ED with wife following in her own car.  See notes.                  OT Short Term Goals - 10/09/17 1752      OT SHORT TERM GOAL #1   Title  Pt and wife will be mod I with HEP for RUE ROM - 11/06/2017    Status  New      OT SHORT TERM GOAL #2   Title  Pt will be min a for grooming with mod cues from wife and intermittent set up prn    Status  New      OT SHORT TERM GOAL #3   Title  Pt will be mod a for UB bathing at wheelchair level with mod cues    Status  New      OT SHORT TERM GOAL #4   Title  Pt will be mod a for LB bathing at sit to stand level  with mod cues and set up    Status  New      OT SHORT TERM GOAL #5   Title  Pt will be max a for UB dressing at wheelchair level, mod cues    Status  New      OT SHORT TERM GOAL #6   Title  Pt and wife will explore options for 3 in 1 commode for pt to use in bathroom or at bedside    Status  New        OT Long Term Goals - 10/09/17 1756      OT LONG TERM GOAL #1   Title  Pt and wife will be mod I with home activity program designed to increase independence in basic self care as well as address cognition - 01/15/2018    Status  New      OT LONG TERM GOAL #2   Title  Pt will be min a for UB bathing after set up with mod cues    Status  New      OT LONG TERM GOAL #3   Title  Pt will be min a for UB dressing with mod cues    Status  New      OT LONG TERM GOAL #4   Title  Pt will be mod a for LB bathing with mod cues    Status  New      OT LONG TERM GOAL #5   Title  Pt will be mod a for LB  dressing with mod cues    Status  New      OT LONG TERM GOAL #6   Title  Pt will be min a for toilet transfers in  bathroom (able to ambulate 3 steps into bathroom with wife).      Status  New      OT LONG TERM GOAL #7   Title  Pt will be mod a for clothing mmgt with toileting    Status  New            Plan - 10/09/17 1759    Clinical Impression Statement  Pt is a 62 year old male s/p complicated medical course and L CVA with craniotomy on 05/30/2017. Pt has received therapy at inpt rehab, SNF and HH. Pt discharged home on 07/30/2017. Pt now presents to the outpatient clinic with the following deficits:  R dominant hemiplegia, muscle weakness, altered tone, impaired sensation, decreased cognition, aphasia, apraxia, impaired perception, decreased balance, decreased trunk control, decreased activity tolerance. Pt is with helmet with planned skull cap replacement in a few weeks (surgery not yet scheduled). Pt will benefit from skilled OT to address these deficts and maximize independence and safety as well as reduce caregiver burden.     Occupational Profile and client history currently impacting functional performance  HLD, HTN, s/p craniotomy, IVC filter with Coumadin, seizures.    Occupational performance deficits (Please refer to evaluation for details):  ADL's;IADL's;Rest and Sleep;Work;Leisure;Social Participation    Rehab Potential  Fair    Current Impairments/barriers affecting progress:  severity of deficits    OT Frequency  2x / week    OT Duration  Other (comment) 14 weeks    OT Treatment/Interventions  Self-care/ADL training;Moist Heat;Therapeutic exercise;Neuromuscular education;DME and/or AE instruction;Manual Therapy;Functional Mobility Training;Passive range of motion;Therapeutic activities;Splinting;Patient/family education;Balance training    Plan  review goals, initiate HEP, NMR for trunk/sitting/standing balance, functional mobility, strategies for ADL's, explore options for  commode seat    Clinical Decision Making  Multiple treatment options, significant modification of task necessary    Consulted and Agree with Plan of Care  Patient;Family member/caregiver    Family Member Consulted  wife Tim Bumps       Patient will benefit from skilled therapeutic intervention in order to improve the following deficits and impairments:  Abnormal gait, Decreased activity tolerance, Decreased balance, Decreased cognition, Decreased knowledge of use of DME, Decreased mobility, Decreased range of motion, Decreased safety awareness, Difficulty walking, Decreased strength, Impaired UE functional use, Impaired tone, Impaired sensation, Pain  Visit Diagnosis: 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 (HCC) - Plan: Ot plan of care cert/re-cert  Abnormal posture - Plan: Ot plan of care cert/re-cert  Unsteadiness on feet - Plan: Ot plan of care cert/re-cert  Muscle weakness (generalized) - Plan: Ot plan of care cert/re-cert  Other symptoms and signs involving cognitive functions following cerebral infarction - Plan: Ot plan of care cert/re-cert  Other symptoms and signs involving the nervous system - Plan: Ot plan of care cert/re-cert  Other symptoms and signs involving the musculoskeletal system - Plan: Ot plan of care cert/re-cert  Apraxia - Plan: Ot plan of care cert/re-cert    Problem List There are no active problems to display for this patient.   Norton Pastel, OTR/L 10/09/2017, 6:15 PM  [facility] [address] [place], [place], [address] Phone: [phone]   Fax:  [phone]  Name: Tim Stephens MRN: [id number] Date of Birth: [birth year]

## 2017-10-09 NOTE — ED Notes (Signed)
Pt with aphasia from previous CVA. Pt A&Ox4, pt able to verbalize his name with some trouble. When given multiple options for the date, pt was able to identify that it was March 2019.

## 2017-10-09 NOTE — ED Notes (Signed)
ED Provider at bedside. 

## 2017-10-09 NOTE — ED Triage Notes (Signed)
Pt to ER from neurology office, today was initial appt for evaluation for therapy as a result from hemorrhagic stroke 4 months ago. Pt had witnessed grand mal 30-45 second seizure. Pt a/o x4 at this time. VSS. 18 LAC. 2 episodes emesis- post seizure.

## 2017-10-09 NOTE — ED Notes (Addendum)
Tim Stephens, wife at pt bedside.

## 2017-10-09 NOTE — Discharge Instructions (Addendum)
Increase your Keppra (Levetiracetam) to 750 mg twice daily starting with tonight's dose.  Your INR today was 2.85.  Keep your next scheduled appointment with your neurologist

## 2017-10-16 ENCOUNTER — Ambulatory Visit: Payer: BLUE CROSS/BLUE SHIELD | Admitting: Occupational Therapy

## 2017-10-16 DIAGNOSIS — I69318 Other symptoms and signs involving cognitive functions following cerebral infarction: Secondary | ICD-10-CM

## 2017-10-16 DIAGNOSIS — R29898 Other symptoms and signs involving the musculoskeletal system: Secondary | ICD-10-CM

## 2017-10-16 DIAGNOSIS — R293 Abnormal posture: Secondary | ICD-10-CM

## 2017-10-16 DIAGNOSIS — R29818 Other symptoms and signs involving the nervous system: Secondary | ICD-10-CM

## 2017-10-16 DIAGNOSIS — R2681 Unsteadiness on feet: Secondary | ICD-10-CM

## 2017-10-16 DIAGNOSIS — R482 Apraxia: Secondary | ICD-10-CM

## 2017-10-16 DIAGNOSIS — I69351 Hemiplegia and hemiparesis following cerebral infarction affecting right dominant side: Secondary | ICD-10-CM | POA: Diagnosis not present

## 2017-10-16 DIAGNOSIS — M6281 Muscle weakness (generalized): Secondary | ICD-10-CM

## 2017-10-16 NOTE — Therapy (Signed)
[facility] [address] [place], [place], [address] Phone: [phone]   Fax:  [phone]  Occupational Therapy Treatment  Patient Details  Name: Tim Stephens MRN: [id number] Date of Birth: 1955/12/20 Referring Provider: Jenita Seashore, MD   Encounter Date: 10/16/2017  OT End of Session - 10/16/17 1634    Visit Number  2    Number of Visits  22 pt used 8 visits with HHOT    Date for OT Re-Evaluation  01/15/18    Authorization Type  BCBS    Authorization Time Period  22 visits total left after [facility] - await approval    OT Start Time  1402    OT Stop Time  1448    OT Time Calculation (min)  46 min    Activity Tolerance  Patient tolerated treatment well       No past medical history on file.  No past surgical history on file.  There were no vitals filed for this visit.  Subjective Assessment - 10/16/17 1408    Subjective   "OK" to goals.  Reviewed with wife as well as pt is aphasic    Patient is accompained by:  Family member [name]    Pertinent History  05/30/2017 admitted to [facility] with LMI cutoff, s/p thromectomy with resultant L CVA due mulitple thromboses;  IVC filter with Coumadin, 06/01/2017 craniotomy    Patient Stated Goals  unable to state due to aphasia    Currently in Pain?  No/denies                   OT Treatments/Exercises (OP) - 10/16/17 0001      ADLs   ADL Comments  Reviewed OT POC and goals - pt and wife in agreement.  Also reviewed remaining visits for OT as pt had approximately 4 weeks of OT and wife thinks they used 8 visits.        Neurological Re-education Exercises   Other Weight-Bearing Exercises 1  Neuro re ed to address transfers, sit to stand, static and dynamic standing balance and stepping with LLE to increase activity on R side.  Pt with more activity in RLE and trunk than initially evident. At end of session, pt had another seizure.followed by vomiting. Pt alert and  assisted back into wheelchair. Wife wished to take pt home stating "They didn't do anything for him at the ED last time - just sent him home and increased his medication." WIfe to call MD asap to alert MD that seizures are still not controlled.               OT Education - 10/16/17 1631    Education provided  Yes    Education Details  goals, OT POC       OT Short Term Goals - 10/16/17 1631      OT SHORT TERM GOAL #1   Title  Pt and wife will be mod I with HEP for RUE ROM - 11/06/2017    Status  On-going      OT SHORT TERM GOAL #2   Title  Pt will be min a for grooming with mod cues from wife and intermittent set up prn    Status  On-going      OT SHORT TERM GOAL #3   Title  Pt will be mod a for UB bathing at wheelchair level with mod cues    Status  On-going      OT  SHORT TERM GOAL #4   Title  Pt will be mod a for LB bathing at sit to stand level  with mod cues and set up    Status  On-going      OT SHORT TERM GOAL #5   Title  Pt will be max a for UB dressing at wheelchair level, mod cues    Status  On-going      OT SHORT TERM GOAL #6   Title  Pt and wife will explore options for 3 in 1 commode for pt to use in bathroom or at bedside    Status  On-going        OT Long Term Goals - 10/16/17 1632      OT LONG TERM GOAL #1   Title  Pt and wife will be mod I with home activity program designed to increase independence in basic self care as well as address cognition - 01/15/2018    Status  On-going      OT LONG TERM GOAL #2   Title  Pt will be min a for UB bathing after set up with mod cues    Status  On-going      OT LONG TERM GOAL #3   Title  Pt will be min a for UB dressing with mod cues    Status  On-going      OT LONG TERM GOAL #4   Title  Pt will be mod a for LB bathing with mod cues    Status  On-going      OT LONG TERM GOAL #5   Title  Pt will be mod a for LB dressing with mod cues    Status  On-going      OT LONG TERM GOAL #6   Title  Pt will be  min a for toilet transfers in bathroom (able to ambulate 3 steps into bathroom with wife).      Status  On-going      OT LONG TERM GOAL #7   Title  Pt will be mod a for clothing mmgt with toileting    Status  On-going            Plan - 10/16/17 1632    Clinical Impression Statement  Pt and wife in agreement with goals. Pt with seizure followed by vomiting at end of session, wife did not wish to take to ED.  Pt alert and assisted back to wheelchair. Wife to follow up with MD asap.    Occupational Profile and client history currently impacting functional performance  HLD, HTN, s/p craniotomy, IVC filter with Coumadin, seizures.    Occupational performance deficits (Please refer to evaluation for details):  ADL's;IADL's;Rest and Sleep;Work;Leisure;Social Participation    Rehab Potential  Fair    Current Impairments/barriers affecting progress:  severity of deficits    OT Frequency  2x / week    OT Duration  8 weeks    OT Treatment/Interventions  Self-care/ADL training;Moist Heat;Therapeutic exercise;Neuromuscular education;DME and/or AE instruction;Manual Therapy;Functional Mobility Training;Passive range of motion;Therapeutic activities;Splinting;Patient/family education;Balance training    Plan   address edema mgmt/ue positioning,NMR for trunk/sitting/standing balance, functional mobility, strategies for ADL's, explore options for commode seat    Consulted and Agree with Plan of Care  Patient;Family member/caregiver    Family Member Consulted  [name]       Patient will benefit from skilled therapeutic intervention in order to improve the following deficits and impairments:  Abnormal gait, Decreased activity tolerance, Decreased balance,  Decreased cognition, Decreased knowledge of use of DME, Decreased mobility, Decreased range of motion, Decreased safety awareness, Difficulty walking, Decreased strength, Impaired UE functional use, Impaired tone, Impaired sensation, Pain  Visit  Diagnosis: 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   Norton Pastelulaski, Yavier Snider Halliday, OTR/L 10/16/2017, 4:36 PM  [facility] [address] [place], [place], [address] Phone: [phone]   Fax:  [phone]  Name: Tim Stephens MRN: [id number] Date of Birth: 01/30/1956

## 2017-10-17 ENCOUNTER — Encounter: Payer: Self-pay | Admitting: Physical Therapy

## 2017-10-17 ENCOUNTER — Ambulatory Visit: Payer: BLUE CROSS/BLUE SHIELD | Admitting: Physical Therapy

## 2017-10-17 DIAGNOSIS — R2681 Unsteadiness on feet: Secondary | ICD-10-CM

## 2017-10-17 DIAGNOSIS — M6281 Muscle weakness (generalized): Secondary | ICD-10-CM

## 2017-10-17 DIAGNOSIS — R293 Abnormal posture: Secondary | ICD-10-CM

## 2017-10-17 DIAGNOSIS — I69351 Hemiplegia and hemiparesis following cerebral infarction affecting right dominant side: Secondary | ICD-10-CM | POA: Diagnosis not present

## 2017-10-18 NOTE — Therapy (Signed)
[facility] [address] [place], [place], [address] Phone: [phone]   Fax:  [phone]  Physical Therapy Treatment  Patient Details  Name: Tim BrackenJames M Stephens MRN: [id number] Date of Birth: 11/30/1955 Referring Provider: Jenita SeashoreSamuel Kelly, MD   Encounter Date: 10/17/2017  PT End of Session - 10/18/17 2055    Visit Number  2    Number of Visits  33    Date for PT Re-Evaluation  01/03/18    Authorization Type  BC[employer]-30 visit limit for each- (25 visits after 5 visits of HHPT); initial 8 visits approved/can ask for more    Authorization Time Period  10/09/17-12/09/17    Authorization - Visit Number  1    Authorization - Number of Visits  8    PT Start Time  1400    PT Stop Time  1450    PT Time Calculation (min)  50 min    Equipment Utilized During Treatment  Gait belt    Activity Tolerance  Patient tolerated treatment well;Other (comment) Seizures occurred after approx 15-20 minutes into therapy    Behavior During Therapy  [facility] for tasks assessed/performed       History reviewed. No pertinent past medical history.  History reviewed. No pertinent surgical history.  There were no vitals filed for this visit.  Subjective Assessment - 10/18/17 2027    Subjective  To have neurologist appointment tomorrow.  He was very tired from yesterday (had seizure during OT session).  Wife reports she has counted and feels pt had 5 HHPT visits.    Patient is accompained by:  Family member wife    Pertinent History  Tim HessJames Stephens is a 62 year old right hand dominant male with past medical history significant for hypertension and hyperlipidemia who [facility] with a aphasia and right hemibody weakness. CTA was obtained and revealed left M1 cutoff. He was taken to IR by Dr. Lionel DecemberFargen for cerebral angiography with mechanical thrombectomy. The patient's acute hospital course was complicated by worsening cytotoxic edema as well areas  of hemorrhagic conversion with left to right midline shift. He was taken to the OR on 06/01/2017 for decompressive left hemicraniectomy. [name]. He was found to have a right peroneal, right posterior tibial, right gastrocnemius,left basilic, and left axillary venous thrombi. He then developed totally occluding superficial thrombophlebitis of the left cephalic vein and right cephalic vein. Venous thrombus then progressed in the left upper arm to the left brachial vein. The patient was not anticoagulated due to recent hemorrhagic conversion of stroke. He underwent serial scans to evaluate for progression of venous thrombosis. PEG was placed by Dr. Allayne ButcherMowery on 06/15/2017. There was progression of right lower extremity DVT from the posterior tibial vein to the right common femoral vein. Vascular surgery was consulted and placed an inferior vena cava filter on 06/18/2017. expressive aphasia, verbal apraxia; [facility] 2019, then [facility], [facility], speech discharged 09/21/17    Patient Stated Goals  Per wife:  to get strong enough to get upstairs, and use the bathroom on his own.    Currently in Pain?  No/denies                      Neuro Re-education   [facility]/Exercise - 10/18/17 0001      Transfers   Transfers  Sit to Stand;Stand to Sit;Stand Pivot Transfers    Sit to  Stand  3: Mod assist;From bed;Without upper extremity assist UEs on knees     Sit to Stand Details  Verbal cues for sequencing;Verbal cues for technique    Sit to Stand Details (indicate cue type and reason)  Cues for coming sit to stand, facilitating increased weightshift through RLE (therefore needs more assist than at eval)    Stand to Sit  4: Min assist;With upper extremity assist;To bed    Stand Pivot Transfers  3: Mod assist wheelchair <>mat    Comments  Practiced sit<>stand x 3 reps.  On third rep:  had mirror placed in front of patient.  Upon  standing, PT provides tactile cues for increased weightshift through RLE, with use of visual cues of mirror.  Pt able to stand 30-60 seconds with min/mod assist.      Seated edge of mat:  Facilitation provided for anterior pelvic tilt<>relaxed posture, x 10 reps, then lateral weightshifting to engage R trunk Seated pillow squeeze x 5 reps, 2 sets to engage RLE positioning    Self Care: Upon sitting after third rep of standing, pt gestures that he doesn't feel well and experiences seizure.  Pt supported in reclined sitting position until seizure passed.  Pt alert and was assisted back to wheelchair.  Discussed with wife his recent seizures and they are to go to neurologist tomorrow and will make him aware.  Educated wife in positioning/assisting patient during seizure (ie-not to lie flat in case of vomiting).   PT Education - 10/18/17 2054    Education provided  Yes    Education Details  Reviewed PT goals, PT POC    Person(s) Educated  Patient;Spouse    Methods  Explanation    Comprehension  Verbalized understanding       PT Short Term Goals - 10/06/17 0841      PT SHORT TERM GOAL #1   Title  Pt will perform HEP with wife's supervision for improved balance, strength, gait.  TARGET 11/03/17    Time  4    Period  Weeks    Status  New    Target Date  11/03/17      PT SHORT TERM GOAL #2   Title  Pt will perform sit to stand with supervision, for improved transfer efficiency and safety with gait.    Time  4    Period  Weeks    Status  New    Target Date  11/03/17      PT SHORT TERM GOAL #3   Title  Pt will ambulate at least 100 ft using hemiwalker with minimal assistance for improved gait safety and household mobility.    Time  4    Period  Weeks    Status  New    Target Date  11/03/17      PT SHORT TERM GOAL #4   Title  Stand pivot transfer to be assessed, with goal to be written as appropriate (as wife is currently assisting at home-did not assess at eval)    Time  4     Period  Weeks    Status  New    Target Date  11/03/17      PT SHORT TERM GOAL #5   Title  Pt/wife will verbalize understanding of fall prevention in home environment.    Time  4    Period  Weeks    Status  New    Target Date  11/03/17      Additional Short Term Goals  Additional Short Term Goals  Yes      PT SHORT TERM GOAL #6   Title  Berg score to increase by at least 8 points, for improved standing balance and decreased fall risk.    Time  4    Period  Weeks    Status  New    Target Date  11/03/17        PT Long Term Goals - 10/06/17 1317      PT LONG TERM GOAL #1   Title  Pt/wife will verbalize plans for continued community fitness upon d/c from PT.  TARGET 12/29/17    Time  12    Period  Weeks    Status  New    Target Date  12/29/17      PT LONG TERM GOAL #2   Title  Pt will perform sit to stand transfers modified independently, for improved safety and independence with transfers.    Time  12    Period  Weeks    Status  New    Target Date  12/29/17      PT LONG TERM GOAL #3   Title  Pt will ambulate at least 500 ft using least restrictive assistive device, with supervision, for improved safety and independence with gait.    Time  12    Period  Weeks    Status  New    Target Date  12/29/17      PT LONG TERM GOAL #4   Title  GAit velocity to be assessed, with goal to be set as appropriate.    Time  12    Period  Weeks    Status  New    Target Date  12/29/17      PT LONG TERM GOAL #5   Title  Pt will negotiate 12 steps using handrail, with supervision, for improved safety and independence with stair negotiation for home.    Time  12    Period  Weeks    Status  New    Target Date  12/29/17            Plan - 10/18/17 2100    Clinical Impression Statement  Pt and wife in agreement with goals discussed.  Able to focus initial part of session on sitting posture, sit to stand and brief standing.  Pt experienced at least one seizure after returning to  sit from standing.  Wife does not wish to take to ED, as they have neurologist appointment tomorrow.  Pt alert and assisted back to wheelchair.    Rehab Potential  Good    Clinical Impairments Affecting Rehab Potential  Good family support; severity of deficits    PT Frequency  Other (comment) 2x/wk for 4 weeks, then 3x/wk for 8 weeks    PT Duration  12 weeks    PT Treatment/Interventions  ADLs/Self Care Home Management;DME Instruction;Gait training;Stair training;Functional mobility training;Therapeutic activities;Therapeutic exercise;Balance training;Orthotic Fit/Training;Patient/family education;Neuromuscular re-education    PT Next Visit Plan  Initiate HEP for trunk, lower extremity strengthening, transfer, and gait training    Consulted and Agree with Plan of Care  Patient;Family member/caregiver    Family Member Consulted  wife       Patient will benefit from skilled therapeutic intervention in order to improve the following deficits and impairments:  Abnormal gait, Decreased activity tolerance, Decreased balance, Decreased knowledge of precautions, Decreased endurance, Decreased knowledge of use of DME, Decreased mobility, Decreased safety awareness, Difficulty walking, Decreased strength, Impaired tone, Postural  dysfunction  Visit Diagnosis: Muscle weakness (generalized)  Abnormal posture  Unsteadiness on feet     Problem List There are no active problems to display for this patient.   MARRIOTT,AMY W. 10/18/2017, 9:03 PM  Tim Maidens., PT    [facility] [address] [place], [place], [address] Phone: [phone]   Fax:  [phone]  Name: Tim Stephens MRN: [id number] Date of Birth: 07/26/1955

## 2017-10-19 ENCOUNTER — Encounter: Payer: Self-pay | Admitting: Speech Pathology

## 2017-10-19 ENCOUNTER — Ambulatory Visit: Payer: BLUE CROSS/BLUE SHIELD | Admitting: Occupational Therapy

## 2017-10-19 DIAGNOSIS — M6281 Muscle weakness (generalized): Secondary | ICD-10-CM

## 2017-10-19 NOTE — Therapy (Signed)
[facility] [address] [place], [place], [address] Phone: [phone]   Fax:  [phone]  Occupational Therapy Treatment  Patient Details  Name: Tim Stephens MRN: [id number] Date of Birth: [birth year] Referring Provider: Jenita Seashore, MD   Encounter Date: 10/19/2017    No past medical history on file.  No past surgical history on file.  There were no vitals filed for this visit.      Discussion with pt and pt's wife Tim Stephens today.  Pt has had seizures 3/3 last visits in clinic during therapy sessions. [name] wife reports that she has been in contact yesterday with neurologist who states he will be addressing this and states that he thinks seizures may be in part related to cardiac issues.  Pt also with imminent skull flap surgery. Given several significant medical issues that negatively impact pt's ability to fully engage and benefit from therapy, as well as given that pt has limited visits, decision was made to place pt on hold until medical issues have been addressed and pt is more medically stable. Pt and wife both in agreement.  [name] schedule adjusted for pt to potentially return 11/28/2017. Upon return and after re-eval check therapy will request date extension for visits already approved (pt has had 8 OT visits approved through 12/09/2017).  Wife to call week before if pt is not yet ready to return on 11/28/2017.                       OT Short Term Goals - 10/19/17 1433      OT SHORT TERM GOAL #1   Title  Pt and wife will be mod I with HEP for RUE ROM - 11/06/2017    Status  On-going      OT SHORT TERM GOAL #2   Title  Pt will be min a for grooming with mod cues from wife and intermittent set up prn    Status  On-going      OT SHORT TERM GOAL #3   Title  Pt will be mod a for UB bathing at wheelchair level with mod cues    Status  On-going      OT SHORT TERM GOAL #4   Title  Pt will be mod a for  LB bathing at sit to stand level  with mod cues and set up    Status  On-going      OT SHORT TERM GOAL #5   Title  Pt will be max a for UB dressing at wheelchair level, mod cues    Status  On-going      OT SHORT TERM GOAL #6   Title  Pt and wife will explore options for 3 in 1 commode for pt to use in bathroom or at bedside    Status  On-going        OT Long Term Goals - 10/19/17 1433      OT LONG TERM GOAL #1   Title  Pt and wife will be mod I with home activity program designed to increase independence in basic self care as well as address cognition - 01/15/2018    Status  On-going      OT LONG TERM GOAL #2   Title  Pt will be min a for UB bathing after set up with mod cues    Status  On-going      OT LONG TERM GOAL #3   Title  Pt  will be min a for UB dressing with mod cues    Status  On-going      OT LONG TERM GOAL #4   Title  Pt will be mod a for LB bathing with mod cues    Status  On-going      OT LONG TERM GOAL #5   Title  Pt will be mod a for LB dressing with mod cues    Status  On-going      OT LONG TERM GOAL #6   Title  Pt will be min a for toilet transfers in bathroom (able to ambulate 3 steps into bathroom with wife).      Status  On-going      OT LONG TERM GOAL #7   Title  Pt will be mod a for clothing mmgt with toileting    Status  On-going              Patient will benefit from skilled therapeutic intervention in order to improve the following deficits and impairments:     Visit Diagnosis: Muscle weakness (generalized)    Problem List There are no active problems to display for this patient.   Norton Pastelulaski, Karen Halliday, OTR/L 10/19/2017, 2:34 PM  [facility] [address] [place], [place], [address] Phone: [phone]   Fax:  [phone]  Name: Tim Stephens MRN: [id number] Date of Birth: 01/11/1956

## 2017-10-19 NOTE — Therapy (Signed)
[facility] [address] [place], [place], [address] Phone: [phone]   Fax:  [phone]  Patient Details  Name: Tim BrackenJames M Creson MRN: [id number] Date of Birth: 03/29/1956 Referring Provider:  No ref. provider found  Encounter Date: 10/19/2017    OT had discussion with pt and pt's [name] today.  Pt has had seizures 3/3 last visits in clinic during therapy sessions. [name] wife reports that she has been in contact yesterday with neurologist who states he will be addressing this and states that he thinks seizures may be in part related to cardiac issues.  Pt also with imminent skull flap surgery. Given several significant medical issues that negatively impact pt's ability to fully engage and benefit from therapy, as well as given that pt has limited visits, decision was made to place pt on hold until medical issues have been addressed and pt is more medically stable. Pt and wife both in agreement.  [name] schedule adjusted for pt to potentially return 11/28/2017. Upon return and after re-eval check therapy will request date extension for visits already approved   Wife to call week before if pt is not yet ready to return on 11/28/2017.       Kaymen Adrian, Radene JourneyLaura Ann MS, CCC-SLP 10/19/2017, 2:42 PM  [facility] [address] [place], [place], [address] Phone: [phone]   Fax:  [phone]

## 2017-10-23 ENCOUNTER — Encounter: Payer: BLUE CROSS/BLUE SHIELD | Admitting: Occupational Therapy

## 2017-10-23 ENCOUNTER — Ambulatory Visit: Payer: BLUE CROSS/BLUE SHIELD | Admitting: Physical Therapy

## 2017-10-26 ENCOUNTER — Encounter: Payer: BLUE CROSS/BLUE SHIELD | Admitting: Occupational Therapy

## 2017-10-30 ENCOUNTER — Encounter: Payer: BLUE CROSS/BLUE SHIELD | Admitting: Occupational Therapy

## 2017-10-30 ENCOUNTER — Ambulatory Visit: Payer: BLUE CROSS/BLUE SHIELD | Admitting: Physical Therapy

## 2017-10-30 ENCOUNTER — Ambulatory Visit: Payer: BLUE CROSS/BLUE SHIELD | Admitting: Speech Pathology

## 2017-10-31 ENCOUNTER — Encounter: Payer: BLUE CROSS/BLUE SHIELD | Admitting: Occupational Therapy

## 2017-10-31 ENCOUNTER — Ambulatory Visit: Payer: BLUE CROSS/BLUE SHIELD | Admitting: Physical Therapy

## 2017-11-07 ENCOUNTER — Ambulatory Visit: Payer: BLUE CROSS/BLUE SHIELD | Admitting: Rehabilitation

## 2017-11-07 ENCOUNTER — Encounter: Payer: BLUE CROSS/BLUE SHIELD | Admitting: Occupational Therapy

## 2017-11-08 ENCOUNTER — Ambulatory Visit: Payer: BLUE CROSS/BLUE SHIELD | Admitting: Physical Therapy

## 2017-11-08 ENCOUNTER — Encounter: Payer: BLUE CROSS/BLUE SHIELD | Admitting: Occupational Therapy

## 2017-11-14 ENCOUNTER — Ambulatory Visit: Payer: BLUE CROSS/BLUE SHIELD | Admitting: Rehabilitation

## 2017-11-14 ENCOUNTER — Encounter: Payer: BLUE CROSS/BLUE SHIELD | Admitting: Speech Pathology

## 2017-11-14 ENCOUNTER — Encounter: Payer: BLUE CROSS/BLUE SHIELD | Admitting: Occupational Therapy

## 2017-11-15 ENCOUNTER — Ambulatory Visit: Payer: BLUE CROSS/BLUE SHIELD | Admitting: Physical Therapy

## 2017-11-15 ENCOUNTER — Encounter: Payer: BLUE CROSS/BLUE SHIELD | Admitting: Occupational Therapy

## 2017-11-21 ENCOUNTER — Encounter: Payer: BLUE CROSS/BLUE SHIELD | Admitting: Speech Pathology

## 2017-11-21 ENCOUNTER — Encounter: Payer: BLUE CROSS/BLUE SHIELD | Admitting: Occupational Therapy

## 2017-11-21 ENCOUNTER — Ambulatory Visit: Payer: BLUE CROSS/BLUE SHIELD | Admitting: Physical Therapy

## 2017-11-23 ENCOUNTER — Ambulatory Visit: Payer: BLUE CROSS/BLUE SHIELD | Admitting: Physical Therapy

## 2017-11-23 ENCOUNTER — Encounter: Payer: BLUE CROSS/BLUE SHIELD | Admitting: Occupational Therapy

## 2017-11-28 ENCOUNTER — Ambulatory Visit: Payer: BLUE CROSS/BLUE SHIELD | Attending: Family Medicine | Admitting: Physical Therapy

## 2017-11-28 ENCOUNTER — Ambulatory Visit: Payer: BLUE CROSS/BLUE SHIELD | Admitting: Speech Pathology

## 2017-11-28 ENCOUNTER — Ambulatory Visit: Payer: BLUE CROSS/BLUE SHIELD | Admitting: Occupational Therapy

## 2017-11-28 ENCOUNTER — Encounter: Payer: Self-pay | Admitting: Occupational Therapy

## 2017-11-28 ENCOUNTER — Encounter: Payer: Self-pay | Admitting: Speech Pathology

## 2017-11-28 VITALS — BP 123/91 | HR 79

## 2017-11-28 DIAGNOSIS — M6281 Muscle weakness (generalized): Secondary | ICD-10-CM

## 2017-11-28 DIAGNOSIS — R293 Abnormal posture: Secondary | ICD-10-CM

## 2017-11-28 DIAGNOSIS — R29818 Other symptoms and signs involving the nervous system: Secondary | ICD-10-CM

## 2017-11-28 DIAGNOSIS — R482 Apraxia: Secondary | ICD-10-CM

## 2017-11-28 DIAGNOSIS — R2681 Unsteadiness on feet: Secondary | ICD-10-CM

## 2017-11-28 DIAGNOSIS — R29898 Other symptoms and signs involving the musculoskeletal system: Secondary | ICD-10-CM | POA: Insufficient documentation

## 2017-11-28 DIAGNOSIS — R2689 Other abnormalities of gait and mobility: Secondary | ICD-10-CM | POA: Diagnosis present

## 2017-11-28 DIAGNOSIS — R4701 Aphasia: Secondary | ICD-10-CM | POA: Diagnosis present

## 2017-11-28 DIAGNOSIS — I69351 Hemiplegia and hemiparesis following cerebral infarction affecting right dominant side: Secondary | ICD-10-CM | POA: Diagnosis present

## 2017-11-28 DIAGNOSIS — I69318 Other symptoms and signs involving cognitive functions following cerebral infarction: Secondary | ICD-10-CM | POA: Insufficient documentation

## 2017-11-28 NOTE — Therapy (Signed)
[facility] [address] [place], [place], [address] Phone: [phone]   Fax:  [phone]  Occupational Therapy Treatment  Patient Details  Name: Tim Stephens MRN: [id number] Date of Birth: February 22, 1956 Referring Provider: Jenita Seashore, MD   Encounter Date: 11/28/2017  OT End of Session - 11/28/17 1624    Visit Number  3    Number of Visits  22    Date for OT Re-Evaluation  01/15/18    Authorization Type  BCBS    Authorization Time Period  pt has been approved 8 visits by 01/28/2018    OT Start Time  1146    OT Stop Time  1235    OT Time Calculation (min)  49 min    Activity Tolerance  Patient tolerated treatment well       History reviewed. No pertinent past medical history.  History reviewed. No pertinent surgical history.  There were no vitals filed for this visit.  Subjective Assessment - 11/28/17 1309    Subjective   Well hello (pt is aphasic)    Patient is accompained by:  Family member [name]    Pertinent History  05/30/2017 admitted to [facility] with LMI cutoff, s/p thromectomy with resultant L CVA due mulitple thromboses;  IVC filter with Coumadin, 06/01/2017 craniotomy    Patient Stated Goals  unable to state due to aphasia    Currently in Pain?  Yes    Pain Score  -- unable to rate    Pain Location  Shoulder    Pain Orientation  Right    Pain Descriptors / Indicators  -- unable to describe    Pain Type  Chronic pain    Pain Onset  More than a month ago    Pain Frequency  Intermittent    Aggravating Factors   shoulder abduction greater than 90*    Pain Relieving Factors  avoid that movement.                    OT Treatments/Exercises (OP) - 11/28/17 1310      ADLs   ADL Comments  Educated pt and wife on BP parameters here at rehab.  Wife to contact MD as they are working with MD to modify BP meds - see PT note for current BP.  Also discussed use of visits, end dated now 01/28/2018  and completed re-eval . see notes.         Also re-evaluated pt with changes noted as follows given significant medical events: Pt now has active movement in RUE as follows: Pt has beginning shoulder shrug, shoulder flexion to 20*,IR, elbow flexion/extension, partial wrist flexion, beginning finger flexion all in gravity eliminated.  Pt is now able to tolerate 90* of passive shoulder flexion and abduction.  Edema remains moderate in R hand. Pt no longer has helmet and s/p skull flap replacement.  All other remains the same as initial evaluation.        OT Short Term Goals - 11/28/17 1312      OT SHORT TERM GOAL #1   Title  Pt and wife will be mod I with HEP for RUE ROM - 12/26/2017    Status  On-going      OT SHORT TERM GOAL #2   Title  Pt will be min a for grooming with mod cues from wife and intermittent set up prn    Status  On-going      OT SHORT TERM GOAL #  3   Title  Pt will be mod a for UB bathing at wheelchair level with mod cues    Status  On-going      OT SHORT TERM GOAL #4   Title  Pt will be mod a for LB bathing at sit to stand level  with mod cues and set up    Status  On-going      OT SHORT TERM GOAL #5   Title  Pt will be max a for UB dressing at wheelchair level, mod cues    Status  On-going      OT SHORT TERM GOAL #6   Title  Pt and wife will explore options for 3 in 1 commode for pt to use in bathroom or at bedside    Status  On-going      OT SHORT TERM GOAL #7   Title  Pt will use RUE as stabilizer 25% of the time during basic self care skills with min a and cues.     Status  New        OT Long Term Goals - 11/28/17 1312      OT LONG TERM GOAL #1   Title  Pt and wife will be mod I with home activity program designed to increase independence in basic self care as well as address cognition - 01/23/2018    Status  On-going      OT LONG TERM GOAL #2   Title  Pt will be min a for UB bathing after set up with mod cues    Status  On-going      OT LONG  TERM GOAL #3   Title  Pt will be min a for UB dressing with mod cues    Status  On-going      OT LONG TERM GOAL #4   Title  Pt will be mod a for LB bathing with mod cues    Status  On-going      OT LONG TERM GOAL #5   Title  Pt will be mod a for LB dressing with mod cues    Status  On-going      OT LONG TERM GOAL #6   Title  Pt will be min a for toilet transfers in bathroom (able to ambulate 3 steps into bathroom with wife).      Status  On-going      OT LONG TERM GOAL #7   Title  Pt will be mod a for clothing mmgt with toileting    Status  On-going      OT LONG TERM GOAL #8   Title  Pt will demonstrate ability to use RUE as stabilizer during basic self care 50% of the time with min a and cues.     Status  On-going            Plan - 11/28/17 1332    Clinical Impression Statement  Pt returns after being on hold for extended period of time due to medical complications. Pt has had skull flap replaced.  [name] wife reports no seizure activity but stressed pt has not been in therapy or doing much due to medical issues.     Occupational Profile and client history currently impacting functional performance  HLD, HTN, s/p craniotomy, IVC filter with Coumadin, seizures.    Occupational performance deficits (Please refer to evaluation for details):  ADL's;IADL's;Rest and Sleep;Work;Leisure;Social Participation    Rehab Potential  Good    Current Impairments/barriers affecting progress:  severity of deficits    OT Frequency  2x / week    OT Duration  8 weeks    OT Treatment/Interventions  Self-care/ADL training;Moist Heat;Therapeutic exercise;Neuromuscular education;DME and/or AE instruction;Manual Therapy;Functional Mobility Training;Passive range of motion;Therapeutic activities;Splinting;Patient/family education;Balance training    Plan   address edema mgmt/ue positioning,NMR for trunk/sitting/standing balance, functional mobility, strategies for ADL's, explore options for commode seat     Consulted and Agree with Plan of Care  Patient;Family member/caregiver    Family Member Consulted  [name]       Patient will benefit from skilled therapeutic intervention in order to improve the following deficits and impairments:  Abnormal gait, Decreased activity tolerance, Decreased balance, Decreased cognition, Decreased knowledge of use of DME, Decreased mobility, Decreased range of motion, Decreased safety awareness, Difficulty walking, Decreased strength, Impaired UE functional use, Impaired tone, Impaired sensation, Pain  Visit Diagnosis: Muscle weakness (generalized)  Abnormal posture  Unsteadiness on feet  Other symptoms and signs involving cognitive functions following cerebral infarction  Other symptoms and signs involving the nervous system  Other symptoms and signs involving the musculoskeletal system  Apraxia  Aphasia  Hemiplegia and hemiparesis following cerebral infarction affecting right dominant side (HCC)    Problem List There are no active problems to display for this patient.   Norton Pastel, OTR/L 11/28/2017, 4:26 PM  [facility] [address] [place], [place], [address] Phone: [phone]   Fax:  [phone]  Name: KHRYSTIAN SCHAUF MRN: [id number] Date of Birth: 1956-01-08

## 2017-11-29 NOTE — Therapy (Signed)
[facility] [address] [place], [place], [address] Phone: [phone]   Fax:  [phone]  Speech Language Pathology Treatment  Patient Details  Name: Tim Stephens MRN: [id number] Date of Birth: [birth year] Referring Provider: Dr. Jenita Seashore   Encounter Date: 11/28/2017  End of Session - 11/29/17 2130    Visit Number  2    Number of Visits  17    Date for SLP Re-Evaluation  01/24/18    Authorization Type  30 total ST visits, no auth. Per spouse HHST used 8 visits (22 remaining; 19 after eval and visit 1). Spouse agrees to leave 4 visits prior to end of year. See auth visits    Authorization - Visit Number  10 after HHST and oupt eval and 1 tx    Authorization - Number of Visits  26 30 total, keep 4 remaining for end of year    SLP Start Time  1228    SLP Stop Time   1316    SLP Time Calculation (min)  48 min    Activity Tolerance  Patient tolerated treatment well       History reviewed. No pertinent past medical history.  History reviewed. No pertinent surgical history.  There were no vitals filed for this visit.         ADULT SLP TREATMENT - 11/29/17 2117      General Information   Behavior/Cognition  Alert;Cooperative;Pleasant mood      Treatment Provided   Treatment provided  Cognitive-Linquistic      Cognitive-Linquistic Treatment   Treatment focused on  Aphasia;Apraxia    Skilled Treatment  Pt returns initial eval 10/05/17.  Re assessment (brief) of naming 50% - sentences completion occasionally helpful. Repetition impaired at word level . Perseveration consistent resulting in frustration. Pt frequently aware of his errors and upset by them. 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 Pt consistently ID'd categories with and picture cues (ie: family, meals, places) and was able to answer concrete questions re: meals and restaurants. I  demonstrated adding photo of his wife and male voice stating "This is my wife, [name]" Pt smiled and became tearful. Both pt and his spouse indicated that SGD may be beneficial due to severity of communication impairment.       Assessment / Recommendations / Plan   Plan  Continue with current plan of care      Progression Toward Goals   Progression toward goals  Progressing toward goals         SLP Short Term Goals - 11/29/17 2129      SLP SHORT TERM GOAL #1   Title  Pt will ID object f:5 to sentence description with 90% accuracy and occasional min A    Time  4    Period  Weeks    Status  New      SLP SHORT TERM GOAL #2   Title  Pt will complete automatic speech tasks with 70% accuracy and visual cues over 3 sessions    Time  4    Period  Weeks    Status  New      SLP SHORT TERM GOAL #3   Title  Pt will utilize multimodal communication and AAC to communicate wants and answer questions with 70% accuracy and occasional min A over 3 sessions    Time  4    Period  Weeks    Status  New      SLP SHORT TERM GOAL #4   Title  Pt will write/type at word level with 80% accuracy and occasional min A over 2 sessions    Time  4    Period  Weeks    Status  New       SLP Long Term Goals - 11/29/17 2130      SLP LONG TERM GOAL #1   Title  Pt will comprehend mildly complex conversation over 8 minute conversation with appropriate responses (mulitmodal) and 2 or less requests for repetition    Time  8    Period  Weeks    Status  New      SLP LONG TERM GOAL #2   Title  Pt will utilize multimodal communication/ACC to produce 2 word response to conversation/question 8/10x with occasional min A    Time  8    Period  Weeks    Status  New      SLP LONG TERM GOAL #3   Title  Pt will write 2 word phrases with 80% accuracy and occasional min A    Time  8    Period  Weeks    Status  New      SLP LONG TERM GOAL #4   Title  Pt will produce 1-2 syllable words in phrase completion task  with 70% accuracy and occassional mod A    Time  8    Period  Weeks    Status  New       Plan - 11/29/17 2122    Clinical Impression Statement  Mr. Panas returns today (11/28/17), after being on hold since his initial eval 10/05/17 due to medical issues. 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 Written expression remains at copy level for family names and simple words.  I introduced SGD (Touchtalk) with success in IDing basic categories, as well as answering concrete questions re: meals/restaurants. Pt and spouse afirm strong interest in SGD to assist pt in basic communication. Will apply for trial device. Recommend skilled ST to maximize communication for improved independence and QOL as well as to reduce spouse's burden. Skilled ST to include traditional ST and AAC.     Speech Therapy Frequency  2x / week    Duration  -- 8 weeks    Treatment/Interventions  Language facilitation;Environmental controls;Cueing hierarchy;Oral motor exercises;SLP instruction and feedback;Compensatory strategies;Functional tasks;Cognitive reorganization;Internal/external aids;Multimodal communcation approach;Patient/family education    Potential to Achieve Goals  Fair    Potential Considerations  Severity of impairments    Consulted and Agree with Plan of Care  Patient;Family member/caregiver    Family Member Consulted  spouse, [name]       Patient will benefit from skilled therapeutic intervention in order to improve the following deficits and impairments:   Aphasia  Verbal apraxia    Problem List There are no active problems to display for this patient.   Lovvorn, Radene Journey MS, CCC-SLP 11/29/2017, 9:37 PM  [facility] [address] [place], [place], [address] Phone:  [phone]   Fax:  [phone]   Name: Tim Stephens MRN: [id number] Date of Birth: 19-Aug-1955

## 2017-11-29 NOTE — Therapy (Signed)
[facility] [address] [place], [place], [address] Phone: [phone]   Fax:  [phone]  Physical Therapy Treatment  Patient Details  Name: Tim Stephens MRN: [id number] Date of Birth: [birth year] Referring Provider: Jenita Seashore, MD   Encounter Date: 11/28/2017  PT End of Session - 11/29/17 1112    Visit Number  3    Number of Visits  33    Date for PT Re-Evaluation  01/28/18    Authorization Type  [employer]- initial 8 visits approved/can ask for more    Authorization Time Period  11/28/17-01/28/18    Authorization - Visit Number  3    Authorization - Number of Visits  8    PT Start Time  1109    PT Stop Time  1150    PT Time Calculation (min)  41 min    Equipment Utilized During Treatment  Gait belt    Activity Tolerance  Patient tolerated treatment well;Other (comment) Did not attempt assessing furhter standing or gait due to standing BP 122/98    Behavior During Therapy  WFL for tasks assessed/performed       No past medical history on file.  No past surgical history on file.  Vitals:   11/28/17 1120  BP: (!) 123/91  Pulse: 79    Subjective Assessment - 11/28/17 1112    Subjective  Per wife:  4/25:  surgery for flap replaced following craniotomy; stitches present and gets those out next week; no further helmet needed ; had EEG with no abnormal seizure activity.  Followed up with primary MD-questioned vasal-vagal syncope.  Wife notes more weakness since he's been less mobile; haven't been walking with hemi-walker.      Patient is accompained by:  Family member wife    Pertinent History  Recently, pt on hold for PT due to medical issues and having skull flap surgery.  Tim Stephens is a 2 year old right hand dominant male with past medical history significant for hypertension and hyperlipidemia who was admitted to [facility] on 05/30/2017 with a aphasia and right hemibody weakness. CTA was obtained and revealed left M1  cutoff. He was taken to IR by Dr. Lionel December for cerebral angiography with mechanical thrombectomy. The patient's acute hospital course was complicated by worsening cytotoxic edema as well areas of hemorrhagic conversion with left to right midline shift. He was taken to the OR on 06/01/2017 for decompressive left hemicraniectomy. [name] He was found to have a right peroneal, right posterior tibial, right gastrocnemius,left basilic, and left axillary venous thrombi. He then developed totally occluding superficial thrombophlebitis of the left cephalic vein and right cephalic vein. Venous thrombus then progressed in the left upper arm to the left brachial vein. The patient was not anticoagulated due to recent hemorrhagic conversion of stroke. He underwent serial scans to evaluate for progression of venous thrombosis. PEG was placed by Dr. Allayne Butcher on 06/15/2017. There was progression of right lower extremity DVT from the posterior tibial vein to the right common femoral vein. Vascular surgery was consulted and placed an inferior vena cava filter on 06/18/2017. expressive aphasia, verbal apraxia; therapy at [facility] January 2019, then [facility] rehab for 4 weeks, then [facility] therapy PT, OT, speech discharged 09/21/17    Patient Stated Goals  Per wife:  to get strong enough to get upstairs, and use the bathroom on his own; trying to be more mobile outside the home-fishing, more recreational things Pt agrees to these goals.    Currently in  Pain?  No/denies         [facility] PT Assessment - 11/29/17 0001      Precautions   Precautions  Fall    Precaution Comments  No longer needs helmet; may need to check BP      AROM   Overall AROM Comments  R ankle dorsiflexion 90 degrees      PROM   Overall PROM Comments  R ankle dorsiflexion 10 degrees beyond neutral      Strength   Overall Strength  Deficits    Strength Assessment Site  Hip;Knee;Ankle    Right/Left Hip  Right    Right Hip Flexion  3/5     Right/Left Knee  Right    Right Knee Extension  3+/5    Right/Left Ankle  Right    Right Ankle Dorsiflexion  2/5      Transfers   Transfers  Sit to Stand;Stand to Sit;Stand Pivot Transfers    Sit to Stand  4: Min assist;With upper extremity assist;From chair/3-in-1    Sit to Stand Details (indicate cue type and reason)  Pt holds to counter upon standing.  Decreased initial WB on RLE, with pt shifting weigth upon standing to have more equal WB R and LLE.    Stand to Sit  4: Min assist;With upper extremity assist;To chair/3-in-1    Comments  Pt stands for approx 2 minutes with close supervision to check standing BP.  (Standing BP 122/98, HR 93 bpm)                           PT Education - 11/29/17 1111    Education provided  Yes    Education Details  Reviewed pt's goals, POC and educated patient on breathing techniques to avoid holding breath with transitional movements and exercise.    Person(s) Educated  Patient;Spouse    Methods  Explanation;Demonstration;Verbal cues    Comprehension  Verbalized understanding;Returned demonstration;Verbal cues required       PT Short Term Goals - 11/29/17 1118      PT SHORT TERM GOAL #1   Title  Pt will perform HEP with wife's supervision for improved balance, strength, gait.  TARGET 12/29/17    Time  4    Period  Weeks    Status  On-going    Target Date  12/29/17      PT SHORT TERM GOAL #2   Title  Pt will perform sit to stand with supervision, for improved transfer efficiency and safety with gait.    Time  4    Period  Weeks    Status  On-going    Target Date  12/29/17      PT SHORT TERM GOAL #3   Title  Pt will ambulate at least 100 ft using hemiwalker with minimal assistance for improved gait safety and household mobility.    Time  4    Period  Weeks    Status  On-going    Target Date  12/29/17      PT SHORT TERM GOAL #4   Title  Stand pivot transfer to be assessed, with goal to be written as appropriate (as wife  is currently assisting at home-did not assess at re-eval)    Time  4    Period  Weeks    Status  On-going    Target Date  12/29/17      PT SHORT TERM GOAL #5   Title  Pt/wife  will verbalize understanding of fall prevention in home environment.    Time  4    Period  Weeks    Status  On-going    Target Date  12/29/17      PT SHORT TERM GOAL #6   Title  Berg score to increase by at least 8 points, for improved standing balance and decreased fall risk.    Time  4    Period  Weeks    Status  New        PT Long Term Goals - 11/29/17 1119      PT LONG TERM GOAL #1   Title  Pt/wife will verbalize plans for continued community fitness upon d/c from PT.  TARGET 01/28/18    Time  8    Period  Weeks    Status  On-going    Target Date  01/28/18      PT LONG TERM GOAL #2   Title  Pt will perform sit to stand transfers modified independently, for improved safety and independence with transfers.    Time  8    Period  Weeks    Status  On-going    Target Date  01/28/18      PT LONG TERM GOAL #3   Title  Pt will ambulate at least 300 ft using least restrictive assistive device, with supervision, for improved safety and independence with gait.    Time  8    Period  Weeks    Status  Revised    Target Date  01/28/18      PT LONG TERM GOAL #4   Title  GAit velocity to be assessed, with goal to be set as appropriate.    Time  8    Period  Weeks    Status  On-going    Target Date  01/28/18      PT LONG TERM GOAL #5   Title  Pt will negotiate 12 steps using handrail, with supervision, for improved safety and independence with stair negotiation for home.    Time  8    Period  Weeks    Status  On-going    Target Date  01/28/18            Plan - 11/29/17 1114    Clinical Impression Statement  Pt has been on hold for PT since 10/18/17 visit due to medical issues (question seizures during previous PT/OT sessions) and being hospitalized for skull flap replacement on 11/02/17.  Pt and  wife return to PT today, with pt appearing to have slightly improved RLE strength and improved transfer ability.  However, wife reports they have not been able to do many exercises or walking due to medical issues and hospitalization.  Pt would benefit from skilled PT to address strength, balance, transfers, gait for improved functional mobility.    Rehab Potential  Good    Clinical Impairments Affecting Rehab Potential  Good family support; severity of deficits    PT Frequency  2x / week    PT Duration  8 weeks    PT Treatment/Interventions  ADLs/Self Care Home Management;DME Instruction;Gait training;Stair training;Functional mobility training;Therapeutic activities;Therapeutic exercise;Balance training;Orthotic Fit/Training;Patient/family education;Neuromuscular re-education    PT Next Visit Plan  Check BP; initiate HEP for trunk, lower extremity strengthening, transfers, gait    Consulted and Agree with Plan of Care  Patient;Family member/caregiver    Family Member Consulted  wife       Patient will benefit from skilled therapeutic intervention in  order to improve the following deficits and impairments:  Abnormal gait, Decreased activity tolerance, Decreased balance, Decreased knowledge of precautions, Decreased endurance, Decreased knowledge of use of DME, Decreased mobility, Decreased safety awareness, Difficulty walking, Decreased strength, Impaired tone, Postural dysfunction  Visit Diagnosis: Muscle weakness (generalized)  Unsteadiness on feet  Abnormal posture  Other abnormalities of gait and mobility  Other symptoms and signs involving the nervous system     Problem List There are no active problems to display for this patient.   Gean Maidens 11/29/2017, 11:22 AM  Gean Maidens PT    [facility] [address] [place], [place], [address] Phone: [phone]   Fax:  [phone]  Name: ULYSSE SIEMEN MRN:  [id number] Date of Birth: 09-Apr-1956

## 2017-11-30 ENCOUNTER — Encounter: Payer: Self-pay | Admitting: Occupational Therapy

## 2017-11-30 ENCOUNTER — Ambulatory Visit: Payer: BLUE CROSS/BLUE SHIELD | Admitting: Occupational Therapy

## 2017-11-30 ENCOUNTER — Ambulatory Visit: Payer: BLUE CROSS/BLUE SHIELD

## 2017-11-30 ENCOUNTER — Ambulatory Visit: Payer: BLUE CROSS/BLUE SHIELD | Admitting: Physical Therapy

## 2017-11-30 DIAGNOSIS — R482 Apraxia: Secondary | ICD-10-CM

## 2017-11-30 DIAGNOSIS — I69351 Hemiplegia and hemiparesis following cerebral infarction affecting right dominant side: Secondary | ICD-10-CM

## 2017-11-30 DIAGNOSIS — R2681 Unsteadiness on feet: Secondary | ICD-10-CM

## 2017-11-30 DIAGNOSIS — R29818 Other symptoms and signs involving the nervous system: Secondary | ICD-10-CM

## 2017-11-30 DIAGNOSIS — I69318 Other symptoms and signs involving cognitive functions following cerebral infarction: Secondary | ICD-10-CM

## 2017-11-30 DIAGNOSIS — R4701 Aphasia: Secondary | ICD-10-CM

## 2017-11-30 DIAGNOSIS — M6281 Muscle weakness (generalized): Secondary | ICD-10-CM

## 2017-11-30 DIAGNOSIS — R293 Abnormal posture: Secondary | ICD-10-CM

## 2017-11-30 NOTE — Therapy (Signed)
[facility] [address] [place], [place], [address] Phone: [phone]   Fax:  [phone]  Speech Language Pathology Treatment  Patient Details  Name: Tim Stephens MRN: [id number] Date of Birth: [birth year] Referring Provider: Dr. Jenita Seashore   Encounter Date: 11/30/2017  End of Session - 11/30/17 1651    Visit Number  3    Number of Visits  17    Date for SLP Re-Evaluation  01/24/18    Authorization Type  30 total ST visits, no auth. Per spouse HHST used 8 visits (22 remaining; 19 after eval and visit 1). Spouse agrees to leave 4 visits prior to end of year. See auth visits    Authorization - Visit Number  11    Authorization - Number of Visits  26       History reviewed. No pertinent past medical history.  History reviewed. No pertinent surgical history.  There were no vitals filed for this visit.  Subjective Assessment - 11/30/17 1410    Subjective  "uh huh"    Currently in Pain?  No/denies            ADULT SLP TREATMENT - 11/30/17 1410      General Information   Behavior/Cognition  Alert;Cooperative;Pleasant mood      Treatment Provided   Treatment provided  Cognitive-Linquistic      Cognitive-Linquistic Treatment   Treatment focused on  Aphasia;Apraxia    Skilled Treatment  SLP worked with [employer] speech generating device (SGD) and pt navigated to his wife's name with min A initially, then was independent in two subsequent trials. He navigated from the homepage to appropriate second pages for food wiht initial mod A, then fading cues as session progressed to SBA. He req'd occasional min-mod A to find the target icon, likely complicated by some degree of rt neglect. SLP used SGD for oral motor practice as well adn pt benfitted from tactlile cues on phoneme practice with /b/ contrast with /m/. SLP also targeted /p/ as "whisper sound". Pt was approx 50% succesful at phoneme level at drill level.        Assessment / Recommendations / Plan   Plan  Continue with current plan of care decr frequency to x2/week to save insurance visits      Progression Toward Goals   Progression toward goals  Progressing toward goals       SLP Education - 11/30/17 1650    Education provided  Yes    Education Details  tactile cues (hand in front of mouth to feel plosive nature of /b/ and /p/) at home    Person(s) Educated  Patient;Spouse    Methods  Explanation;Demonstration;Verbal cues    Comprehension  Verbal cues required;Returned demonstration;Need further instruction;Verbalized understanding       SLP Short Term Goals - 11/30/17 1654      SLP SHORT TERM GOAL #1   Title  Pt will ID object f:5 to sentence description with 90% accuracy and occasional min A    Time  4    Period  Weeks    Status  On-going      SLP SHORT TERM GOAL #2   Title  Pt will complete automatic speech tasks with 70% accuracy and visual cues over 3 sessions    Time  4    Period  Weeks    Status  On-going      SLP SHORT TERM GOAL #3   Title  Pt will utilize multimodal  communication and AAC to communicate wants and answer questions with 70% accuracy and occasional min A over 3 sessions    Time  4    Period  Weeks    Status  On-going      SLP SHORT TERM GOAL #4   Title  Pt will write/type at word level with 80% accuracy and occasional min A over 2 sessions    Time  4    Period  Weeks    Status  On-going       SLP Long Term Goals - 11/30/17 1654      SLP LONG TERM GOAL #1   Title  Pt will comprehend mildly complex conversation over 8 minute conversation with appropriate responses (mulitmodal) and 2 or less requests for repetition    Time  8    Period  Weeks    Status  On-going      SLP LONG TERM GOAL #2   Title  Pt will utilize multimodal communication/ACC to produce 2 word response to conversation/question 8/10x with occasional min A    Time  8    Period  Weeks    Status  On-going      SLP LONG TERM GOAL  #3   Title  Pt will write 2 word phrases with 80% accuracy and occasional min A    Time  8    Period  Weeks    Status  On-going      SLP LONG TERM GOAL #4   Title  Pt will produce 1-2 syllable words in phrase completion task with 70% accuracy and occassional mod A    Time  8    Period  Weeks    Status  On-going       Plan - 11/30/17 1653    Clinical Impression Statement  Mr. 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 Pt and spouse cont today to afirm strong interest in SGD to assist pt in basic communication. Device trial initiated yesterday. Recommend skilled ST to maximize communication for improved independence and QOL as well as to reduce spouse's burden. Skilled ST to include traditional ST and AAC.     Speech Therapy Frequency  2x / week    Duration  -- 8 weeks or 17 total visits    Treatment/Interventions  Language facilitation;Environmental controls;Cueing hierarchy;Oral motor exercises;SLP instruction and feedback;Compensatory strategies;Functional tasks;Cognitive reorganization;Internal/external aids;Multimodal communcation approach;Patient/family education    Potential to Achieve Goals  Fair    Potential Considerations  Severity of impairments    Consulted and Agree with Plan of Care  Patient;Family member/caregiver    Family Member Consulted  spouse, [name]       Patient will benefit from skilled therapeutic intervention in order to improve the following deficits and impairments:   Aphasia  Verbal apraxia    Problem List There are no active problems to display for this patient.   [facility] ,MS, CCC-SLP  11/30/2017, 4:55 PM  [facility] [address] [place], [place], [address] Phone: [phone]   Fax:  [phone]   Name: Tim Stephens MRN: [id number] Date of Birth: 24-Feb-1956

## 2017-11-30 NOTE — Therapy (Signed)
[facility] [address] [place], [place], [address] Phone: [phone]   Fax:  [phone]  Occupational Therapy Treatment  Patient Details  Name: Tim Stephens MRN: [id number] Date of Birth: 12-15-1955 Referring Provider: Jenita Seashore, MD   Encounter Date: 11/30/2017  OT End of Session - 11/30/17 1720    Visit Number  4    Number of Visits  22    Date for OT Re-Evaluation  01/15/18    Authorization Type  [employer]    Authorization Time Period  pt has been approved 8 visits by 01/28/2018    OT Start Time  1448    OT Stop Time  1532    OT Time Calculation (min)  44 min    Activity Tolerance  Patient tolerated treatment well    Behavior During Therapy  [facility] for tasks assessed/performed       History reviewed. No pertinent past medical history.  History reviewed. No pertinent surgical history.  There were no vitals filed for this visit.  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  No/denies    Pain Score  0-No pain                   OT Treatments/Exercises (OP) - 11/30/17 0001      Neurological Re-education Exercises   Other Exercises 1  Neuromuscular reeducation to address patient's ability to activate muscles on right side of body.  In sit to stand, patient leans away from midline to load left leg.  Able to facilitate more neutral position to allow activation of right leg.  Patient has difficulty with weight shifting toward right and forward in general.  After standing and stepping, patient had episode of reduced responsiveness, and BP apparent drop to 95/71.   Positioned patient in supine, and after 2 min BP returned to normal level, and patient asymptomatic.      Other Exercises 2  Neuro reed to address active movement in RUE.  Patient holds his breath with all attempts to use right arm, but able to use laughing or counting to help foster breathing with movement.  Patient with active movement in shoulder , elbow and hand - tends to over recruit muscles - but responds well to tone of voice or even verbal cueing.  Worked on partial weight bearing into right arm, and follwed with active movement shoulder and elbow.               OT Education - 11/30/17 1720    Education provided  Yes    Education Details  breathing through movement vs breath holding    Person(s) Educated  Patient    Methods  Explanation;Demonstration;Verbal cues    Comprehension  Need further instruction       OT Short Term Goals - 11/28/17 1312      OT SHORT TERM GOAL #1   Title  Pt and wife will be mod I with HEP for RUE ROM - 12/26/2017    Status  On-going      OT SHORT TERM GOAL #2   Title  Pt will be min a for grooming with mod cues from wife and intermittent set up prn    Status  On-going      OT SHORT TERM GOAL #3   Title  Pt will be mod a for UB bathing at wheelchair level with mod cues    Status  On-going      OT SHORT TERM GOAL #4   Title  Pt will be mod a for LB bathing at sit to stand level  with mod cues and set up    Status  On-going      OT SHORT TERM GOAL #5   Title  Pt will be max a for UB dressing at wheelchair level, mod cues    Status  On-going      OT SHORT TERM GOAL #6   Title  Pt and wife will explore options for 3 in 1 commode for pt to use in bathroom or at bedside    Status  On-going      OT SHORT TERM GOAL #7   Title  Pt will use RUE as stabilizer 25% of the time during basic self care skills with min a and cues.     Status  New        OT Long Term Goals - 11/28/17 1312      OT LONG TERM GOAL #1   Title  Pt and wife will be  mod I with home activity program designed to increase independence in basic self care as well as address cognition - 01/23/2018    Status  On-going      OT LONG TERM GOAL #2   Title  Pt will be min a for UB bathing after set up with mod cues    Status  On-going      OT LONG TERM GOAL #3   Title  Pt will be min a for UB dressing with mod cues    Status  On-going      OT LONG TERM GOAL #4   Title  Pt will be mod a for LB bathing with mod cues    Status  On-going      OT LONG TERM GOAL #5   Title  Pt will be mod a for LB dressing with mod cues    Status  On-going      OT LONG TERM GOAL #6   Title  Pt will be min a for toilet transfers in bathroom (able to ambulate 3 steps into bathroom with wife).      Status  On-going      OT LONG TERM GOAL #7   Title  Pt will be mod a for clothing mmgt with toileting    Status  On-going      OT LONG TERM GOAL #8   Title  Pt will demonstrate ability to use RUE as stabilizer during basic self care 50% of the time with min a and cues.     Status  On-going            Plan - 11/30/17 1721    Clinical Impression Statement  Patient has reduced level of activity tolerance after recent surgery, but is willing to work in therapy.  Patient with improving active movement in right side.      Occupational Profile and client history currently impacting functional performance  HLD, HTN, s/p craniotomy, IVC filter with Coumadin, seizures.    Occupational performance deficits (Please refer to evaluation for details):  ADL's;IADL's;Rest and  Sleep;Work;Leisure;Social Participation    Rehab Potential  Good    Current Impairments/barriers affecting progress:  severity of deficits    OT Frequency  2x / week    OT Duration  8 weeks    OT Treatment/Interventions  Self-care/ADL training;Moist Heat;Therapeutic exercise;Neuromuscular education;DME and/or AE instruction;Manual Therapy;Functional Mobility Training;Passive range of motion;Therapeutic  activities;Splinting;Patient/family education;Balance training    Plan  NMR trunk, sit to stand, stand to sit, strategies for ADL, explore options for commode    Clinical Decision Making  Multiple treatment options, significant modification of task necessary    Consulted and Agree with Plan of Care  Patient;Family member/caregiver    Family Member Consulted  [name]       Patient will benefit from skilled therapeutic intervention in order to improve the following deficits and impairments:  Abnormal gait, Decreased activity tolerance, Decreased balance, Decreased cognition, Decreased knowledge of use of DME, Decreased mobility, Decreased range of motion, Decreased safety awareness, Difficulty walking, Decreased strength, Impaired UE functional use, Impaired tone, Impaired sensation, Pain  Visit Diagnosis: Muscle weakness (generalized)  Hemiplegia and hemiparesis following cerebral infarction affecting right dominant side (HCC)  Abnormal posture  Unsteadiness on feet  Other symptoms and signs involving cognitive functions following cerebral infarction  Other symptoms and signs involving the nervous system  Apraxia    Problem List There are no active problems to display for this patient.   Collier Salina, OTR/L 11/30/2017, 5:24 PM  [facility] [address] [place], [place], [address] Phone: [phone]   Fax:  [phone]  Name: Tim Stephens MRN: [id number] Date of Birth: [birth year]

## 2017-12-01 ENCOUNTER — Ambulatory Visit: Payer: BLUE CROSS/BLUE SHIELD

## 2017-12-05 ENCOUNTER — Ambulatory Visit: Payer: BLUE CROSS/BLUE SHIELD | Admitting: Physical Therapy

## 2017-12-05 ENCOUNTER — Encounter: Payer: Self-pay | Admitting: Speech Pathology

## 2017-12-05 ENCOUNTER — Ambulatory Visit: Payer: BLUE CROSS/BLUE SHIELD | Admitting: Speech Pathology

## 2017-12-05 ENCOUNTER — Ambulatory Visit: Payer: BLUE CROSS/BLUE SHIELD | Admitting: Occupational Therapy

## 2017-12-05 DIAGNOSIS — M6281 Muscle weakness (generalized): Secondary | ICD-10-CM | POA: Diagnosis not present

## 2017-12-05 DIAGNOSIS — R482 Apraxia: Secondary | ICD-10-CM

## 2017-12-05 DIAGNOSIS — R4701 Aphasia: Secondary | ICD-10-CM

## 2017-12-05 NOTE — Therapy (Signed)
[facility] [address] [place], [place], [address] Phone: [phone]   Fax:  [phone]  Speech Language Pathology Treatment  Patient Details  Name: Tim Stephens MRN: [id number] Date of Birth: Oct 02, 1955 Referring Provider: Dr. Jenita Seashore   Encounter Date: 12/05/2017  End of Session - 12/05/17 1534    Visit Number  4    Number of Visits  17    Authorization Type  30 total ST visits, no auth. Per spouse HHST used 8 visits (22 remaining; 19 after eval and visit 1). Spouse agrees to leave 4 visits prior to end of year. See auth visits    Authorization - Visit Number  13    Authorization - Number of Visits  26    SLP Start Time  1228    SLP Stop Time   1315    SLP Time Calculation (min)  47 min    Activity Tolerance  Patient tolerated treatment well       History reviewed. No pertinent past medical history.  History reviewed. No pertinent surgical history.  There were no vitals filed for this visit.  Subjective Assessment - 12/05/17 1234    Subjective  "We were going over certain words    Patient is accompained by:  Family member Wife Tim Stephens    Currently in Pain?  No/denies            ADULT SLP TREATMENT - 12/05/17 1235      General Information   Behavior/Cognition  Alert;Cooperative;Pleasant mood      Treatment Provided   Treatment provided  Cognitive-Linquistic      Cognitive-Linquistic Treatment   Treatment focused on  Aphasia;Apraxia    Skilled Treatment  Pt navigated speech generating device (SGD) with min A from ST to locate new icons added and mod A to demonstrate how pt would communicate information in hypothetical situations.  Targeted phonemes /f, v, p, b, sh/ with cv syllables 2-3 vowels for each with frequent mod A. Pt also required mod A to alternate between voice and voiceless sounds. Automatic speech counting by 10's with frequent mod to max A.      Assessment / Recommendations /  Plan   Plan  Continue with current plan of care      Progression Toward Goals   Progression toward goals  Progressing toward goals         SLP Short Term Goals - 12/05/17 1533      SLP SHORT TERM GOAL #1   Title  Pt will ID object f:5 to sentence description with 90% accuracy and occasional min A    Time  3    Period  Weeks    Status  On-going      SLP SHORT TERM GOAL #2   Title  Pt will complete automatic speech tasks with 70% accuracy and visual cues over 3 sessions    Time  3    Period  Weeks    Status  On-going      SLP SHORT TERM GOAL #3   Title  Pt will utilize multimodal communication and AAC to communicate wants and answer questions with 70% accuracy and occasional min A over 3 sessions    Time  3    Period  Weeks    Status  On-going      SLP SHORT TERM GOAL #4   Title  Pt will write/type at word level with 80% accuracy and occasional min A over 2 sessions  Time  3    Period  Weeks    Status  On-going       SLP Long Term Goals - 12/05/17 1533      SLP LONG TERM GOAL #1   Title  Pt will comprehend mildly complex conversation over 8 minute conversation with appropriate responses (mulitmodal) and 2 or less requests for repetition    Time  7    Period  Weeks    Status  On-going      SLP LONG TERM GOAL #2   Title  Pt will utilize multimodal communication/ACC to produce 2 word response to conversation/question 8/10x with occasional min A    Time  7    Period  Weeks    Status  On-going      SLP LONG TERM GOAL #3   Title  Pt will write 2 word phrases with 80% accuracy and occasional min A    Time  7    Period  Weeks    Status  On-going      SLP LONG TERM GOAL #4   Title  Pt will produce 1-2 syllable words in phrase completion task with 70% accuracy and occassional mod A    Time  7    Period  Weeks    Status  On-going       Plan - 12/05/17 1530    Clinical Impression Statement  Mr. Tim Stephens continues to present with severe non fluent aphasia/verbal  apraxia affecting ability to communicate wants/needs and participate in simple conversation, answer simple questions. Trial SGD has been requested. Continue skilled ST to maximize communication for independence, QOL and to reduce caregivers burden.     Speech Therapy Frequency  2x / week    Duration  -- 8 weeks or 17 total visits    Treatment/Interventions  Language facilitation;Environmental controls;Cueing hierarchy;Oral motor exercises;SLP instruction and feedback;Compensatory strategies;Functional tasks;Cognitive reorganization;Internal/external aids;Multimodal communcation approach;Patient/family education    Potential to Achieve Goals  Fair    Potential Considerations  Severity of impairments    Consulted and Agree with Plan of Care  Patient;Family member/caregiver    Family Member Consulted  spouse, Tim Stephens       Patient will benefit from skilled therapeutic intervention in order to improve the following deficits and impairments:   Aphasia  Verbal apraxia    Problem List There are no active problems to display for this patient.   Lovvorn, Radene Journey MS, CCC-SLP 12/05/2017, 3:39 PM  [facility] [address] [place], [place], [address] Phone: [phone]   Fax:  [phone]   Name: Tim Stephens MRN: [id number] Date of Birth: 08/30/1955

## 2017-12-07 ENCOUNTER — Ambulatory Visit: Payer: BLUE CROSS/BLUE SHIELD | Admitting: Occupational Therapy

## 2017-12-07 ENCOUNTER — Ambulatory Visit: Payer: BLUE CROSS/BLUE SHIELD | Admitting: Speech Pathology

## 2017-12-07 ENCOUNTER — Encounter: Payer: Self-pay | Admitting: Speech Pathology

## 2017-12-07 ENCOUNTER — Ambulatory Visit: Payer: BLUE CROSS/BLUE SHIELD | Admitting: Physical Therapy

## 2017-12-07 ENCOUNTER — Encounter: Payer: Self-pay | Admitting: Occupational Therapy

## 2017-12-07 VITALS — BP 107/86

## 2017-12-07 DIAGNOSIS — R293 Abnormal posture: Secondary | ICD-10-CM

## 2017-12-07 DIAGNOSIS — R29898 Other symptoms and signs involving the musculoskeletal system: Secondary | ICD-10-CM

## 2017-12-07 DIAGNOSIS — M6281 Muscle weakness (generalized): Secondary | ICD-10-CM | POA: Diagnosis not present

## 2017-12-07 DIAGNOSIS — R482 Apraxia: Secondary | ICD-10-CM

## 2017-12-07 DIAGNOSIS — R4701 Aphasia: Secondary | ICD-10-CM

## 2017-12-07 DIAGNOSIS — I69351 Hemiplegia and hemiparesis following cerebral infarction affecting right dominant side: Secondary | ICD-10-CM

## 2017-12-07 DIAGNOSIS — R2681 Unsteadiness on feet: Secondary | ICD-10-CM

## 2017-12-07 DIAGNOSIS — I69318 Other symptoms and signs involving cognitive functions following cerebral infarction: Secondary | ICD-10-CM

## 2017-12-07 DIAGNOSIS — R29818 Other symptoms and signs involving the nervous system: Secondary | ICD-10-CM

## 2017-12-07 NOTE — Patient Instructions (Signed)
  FAN  FIVE  FEE  SHOE  THIGH  THEY  LOW  LAY   LIE  [facility] Dr. Annie Sable

## 2017-12-07 NOTE — Therapy (Signed)
[facility] [address] [place], [place], [address] Phone: [phone]   Fax:  [phone]  Speech Language Pathology Treatment  Patient Details  Name: Tim Stephens MRN: [id number] Date of Birth: 03-04-1956 Referring Provider: Dr. Jenita Seashore   Encounter Date: 12/07/2017  End of Session - 12/07/17 1513    Visit Number  5    Number of Visits  17    Date for SLP Re-Evaluation  01/24/18    Authorization Type  30 total ST visits, no auth. Per spouse HHST used 8 visits (22 remaining; 19 after eval and visit 1). Spouse agrees to leave 4 visits prior to end of year. See auth visits    Authorization - Visit Number  14    Authorization - Number of Visits  26    SLP Start Time  1315    SLP Stop Time   1358    SLP Time Calculation (min)  43 min    Activity Tolerance  Patient tolerated treatment well       History reviewed. No pertinent past medical history.  History reviewed. No pertinent surgical history.  There were no vitals filed for this visit.  Subjective Assessment - 12/07/17 1502    Subjective  "He got his staples and stiches out"    Patient is accompained by:  Family member wife,Jessica    Currently in Pain?  No/denies            ADULT SLP TREATMENT - 12/07/17 1503      General Information   Behavior/Cognition  Alert;Cooperative;Pleasant mood      Treatment Provided   Treatment provided  Cognitive-Linquistic      Cognitive-Linquistic Treatment   Treatment focused on  Aphasia;Apraxia    Skilled Treatment  Utilized white board on speech generating device (SGD) to faciliate drawing as non-verbal communication and written expression at the word level. Pt required usual mod A to fill in blanks on simple words, including choice of 2 letters, and copying. UE apraxia may also be affecting written expression. Pt navigated SGD with min verbal cues to communicate what he had for breakfast and he located spouses  picture under family tab with rare min A. Pt utilized house to answer questions re: his hobbies with occasional min to mod A. Targeted automatic speech counting by 10's with consistent mod to max A.  Phonemes /f,sh, th, l/ in cv and cvc words with frequent mod A. Encouraged pt to use drawing or attempt spelling words at home to augment communicatoin.       Assessment / Recommendations / Plan   Plan  Continue with current plan of care      Progression Toward Goals   Progression toward goals  Progressing toward goals       SLP Education - 12/07/17 1510    Education provided  Yes    Education Details  utilize drawing and attempt writing to augment communication, even if word is not spelled correctly, it may approximate target word to help communicate a message    Person(s) Educated  Patient;Spouse    Methods  Explanation;Demonstration;Verbal cues    Comprehension  Returned demonstration;Verbalized understanding;Verbal cues required;Need further instruction       SLP Short Term Goals - 12/07/17 1512      SLP SHORT TERM GOAL #1   Title  Pt will ID object f:5 to sentence description with 90% accuracy and occasional min A    Time  3  Period  Weeks    Status  On-going      SLP SHORT TERM GOAL #2   Title  Pt will complete automatic speech tasks with 70% accuracy and visual cues over 3 sessions    Time  3    Period  Weeks    Status  On-going      SLP SHORT TERM GOAL #3   Title  Pt will utilize multimodal communication and AAC to communicate wants and answer questions with 70% accuracy and occasional min A over 3 sessions    Time  3    Period  Weeks    Status  On-going      SLP SHORT TERM GOAL #4   Title  Pt will write/type at word level with 80% accuracy and occasional min A over 2 sessions    Time  3    Period  Weeks    Status  On-going       SLP Long Term Goals - 12/07/17 1513      SLP LONG TERM GOAL #1   Title  Pt will comprehend mildly complex conversation over 8 minute  conversation with appropriate responses (mulitmodal) and 2 or less requests for repetition    Time  7    Period  Weeks    Status  On-going      SLP LONG TERM GOAL #2   Title  Pt will utilize multimodal communication/ACC to produce 2 word response to conversation/question 8/10x with occasional min A    Time  7    Period  Weeks    Status  On-going      SLP LONG TERM GOAL #3   Title  Pt will write 2 word phrases with 80% accuracy and occasional min A    Time  7    Period  Weeks    Status  On-going      SLP LONG TERM GOAL #4   Title  Pt will produce 1-2 syllable words in phrase completion task with 70% accuracy and occassional mod A    Time  7    Period  Weeks    Status  On-going       Plan - 12/07/17 1511    Clinical Impression Statement  Mr. Seider continues to present with severe non fluent aphasia/verbal apraxia affecting ability to communicate wants/needs and participate in simple conversation, answer simple questions. Trial SGD has been requested. Continue skilled ST to maximize communication for independence, QOL and to reduce caregivers burden.     Speech Therapy Frequency  2x / week    Duration  -- 8 weeks or total of 17 visits    Treatment/Interventions  Language facilitation;Environmental controls;Cueing hierarchy;Oral motor exercises;SLP instruction and feedback;Compensatory strategies;Functional tasks;Cognitive reorganization;Internal/external aids;Multimodal communcation approach;Patient/family education;Other (comment) AAC device    Potential to Achieve Goals  Fair    Potential Considerations  Severity of impairments    Consulted and Agree with Plan of Care  Patient;Family member/caregiver    Family Member Consulted  spouse, [name]       Patient will benefit from skilled therapeutic intervention in order to improve the following deficits and impairments:   Aphasia  Verbal apraxia    Problem List There are no active problems to display for this  patient.   Lovvorn, Radene Journey MS, CCC-SLP 12/07/2017, 3:14 PM  [facility] [address] [place], [place], [address] Phone: [phone]   Fax:  [phone]   Name: Tim Stephens MRN:  [id number] Date of Birth: Sep 09, 1955

## 2017-12-07 NOTE — Patient Instructions (Signed)
Supine Heel Slide REPS: 10  SETS: 1-2  WEEKLY: 5x  DAILY: 1x Setup Begin lying on your back with your legs straight. Movement Slide one heel toward your buttocks, bending your knee as far as is comfortable, then slide it back to the starting position and repeat. Tip Make sure to keep your back flat against the bed during the exercise. STEP 1 STEP 2 Supine Hip Abduction REPS: 10  SETS: 1-2  WEEKLY: 5x  DAILY: 1x Setup Begin lying on your back with your legs straight. Movement Move one leg out to the side as far as you can without bending at your side. Tip Make sure to keep your back on the ground and do not more your upper body during the exercise. Prepared by Lonia Blood [address] [place], [place] [phone] Access your exercises! [email or web address] Your Access Code: [email or web address] Disclaimer: This program provides exercises related to your condition that you can perform at home. As there is a risk of injury with any activity, use caution when performing exercises. If you experience any pain or discomfort, discontinue the exercises and contact your health care provider. Page 1 of 2 12/07/2017 STEP 1 STEP 2 Hooklying Single Leg March REPS: 10  SETS: 1-2  WEEKLY: 5x  DAILY: 1x Setup Begin lying on your back with your knees bent. Movement Tighten your abdominals and slowly raise one leg toward your chest, keeping your knee bent. Return to the starting position and repeat. Tip Make sure to keep your core engaged and continue breathing evenly. Do not let your pelvis rotate during the exercise. STEP 1 STEP 2 Seated Pelvic Tilt REPS: 10  SETS: 1-2  WEEKLY: 7x  DAILY: 1x Setup Begin sitting upright in a chair with your hands on your hips. Movement Gently tilt your pelvis backward, then return to a neutral position, and tilt it forward. Repeat, monitoring the movement with your hands. Tip Make sure to keep your upper back relaxed during  the exercise, and focus the movement just on your pelvis. Prepared by Lonia Blood [address] [place], [place] [phone] Access your exercises! [email or web address] Your Access Code: [email or web address] Disclaimer: This program provides exercises related to your condition that you can perform at home. As there is a risk of injury with any activity, use caution when performing exercises. If you experience any pain or discomfort, discontinue the exercises and contact your health care provider. Page 2 of 2 12/07/2017

## 2017-12-07 NOTE — Therapy (Signed)
[facility] [address] [place], [place], [address] Phone: [phone]   Fax:  [phone]  Occupational Therapy Treatment  Patient Details  Name: Tim Stephens MRN: [id number] Date of Birth: [birth year] Referring Provider: Jenita Seashore, MD   Encounter Date: 12/07/2017  OT End of Session - 12/07/17 1710    Visit Number  5    Number of Visits  22    Date for OT Re-Evaluation  01/15/18    Authorization Type  BCBS    Authorization Time Period  pt has been approved 8 visits by 01/28/2018    OT Start Time  1447    OT Stop Time  1530    OT Time Calculation (min)  43 min    Activity Tolerance  Patient tolerated treatment well       History reviewed. No pertinent past medical history.  History reviewed. No pertinent surgical history.  Vitals:   12/07/17 1701  BP: 107/86    Subjective Assessment - 12/07/17 1700    Subjective   Oh? (when discussing pt doing more of his ADL's)    Patient is accompained by:  Family member wife    Pertinent History  MONITOR BP!!!  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  No/denies                   OT Treatments/Exercises (OP) - 12/07/17 0001      ADLs   UB Dressing  Addressed UB bathing and dressing sitting in wheelchair or EOB.  At this time, pt's wife is completing full activity supine in bed.  After practice and instruction today, pt required only vc's for strategies and close supervision for balance for both activities.  Pt and wife practiced and then were given guidelines in writing.  Will address LB ADL's at later date as pt becomes more comfortable doing UB and BP becomes more consistent.      ADL Comments  Educated pt and wife in edema massage - wife able to return demonstrate  and will provide at home. Pt with less edema in R hand today due to compression glove.       Neurological Re-education Exercises   Other Exercises 1  Neuro re ed in sitting to address arm on body movements using UE ranger.  Pt able to demonstrate shoulder flexion, extension, horizontal ab and adduction and elbow flexion/extension.  Pt able to recognize movement using moveable surface.  Attempted to have pt count or call out during exercises to facilitate breathing.  Pt did beome symptomatic.  Transitioned into supine and symptoms quickly resolved.  BP fluctuated significantly during session from 107/86 at start of session, to 124/89 in supine to 82/55 in sitting and then stabilized at 102/72 in sitting.  Will continue to monitor.              OT Education - 12/07/17 1707    Education provided  Yes    Education Details  UB bathing and dressing, edema massage    Person(s) Educated  Patient;Spouse    Methods  Explanation;Demonstration;Handout    Comprehension  Verbalized understanding;Returned demonstration       OT Short Term Goals - 12/07/17 1708      OT SHORT TERM GOAL #1   Title  Pt and wife will be mod I with HEP for RUE ROM - 12/26/2017    Status  On-going      OT SHORT TERM GOAL #2   Title  Pt will be min a for grooming with mod cues from wife and intermittent set up prn    Status  On-going      OT SHORT TERM GOAL #3   Title  Pt will be mod a for UB bathing at wheelchair level with mod cues    Status  Achieved      OT SHORT TERM GOAL #4   Title  Pt will be mod a for LB bathing at sit to stand level  with mod cues and set up    Status  On-going      OT SHORT TERM GOAL #5   Title  Pt will be max a for UB dressing at wheelchair level, mod cues    Status  On-going      OT SHORT TERM GOAL #6   Title  Pt and wife will explore options for 3 in 1 commode for pt to use in bathroom or at bedside    Status  On-going      OT SHORT TERM GOAL #7   Title  Pt will use RUE as  stabilizer 25% of the time during basic self care skills with min a and cues.     Status  On-going        OT Long Term Goals - 12/07/17 1709      OT LONG TERM GOAL #1   Title  Pt and wife will be mod I with home activity program designed to increase independence in basic self care as well as address cognition - 01/23/2018    Status  On-going      OT LONG TERM GOAL #2   Title  Pt will be min a for UB bathing after set up with mod cues    Status  On-going      OT LONG TERM GOAL #3   Title  Pt will be min a for UB dressing with mod cues    Status  On-going      OT LONG TERM GOAL #4   Title  Pt will be mod a for LB bathing with mod cues    Status  On-going      OT LONG TERM GOAL #5   Title  Pt will be mod a for LB dressing with mod cues    Status  On-going      OT LONG TERM GOAL #6   Title  Pt will be min a for toilet transfers in bathroom (able to ambulate 3 steps into bathroom with wife).      Status  On-going      OT LONG TERM GOAL #7   Title  Pt will be mod a for clothing mmgt with toileting    Status  On-going      OT LONG TERM GOAL #8   Title  Pt will demonstrate ability to use RUE as stabilizer during basic self care 50% of the time with min a and cues.     Status  On-going            Plan - 12/07/17 1709    Clinical Impression Statement  Pt progressing toward goals. BP continues to fluctuate significantly during session and will continue to monitor.     Occupational Profile and client history currently impacting functional performance  HLD,  HTN, s/p craniotomy, IVC filter with Coumadin, seizures.    Occupational performance deficits (Please refer to evaluation for details):  ADL's;IADL's;Rest and Sleep;Work;Leisure;Social Participation    Rehab Potential  Good    Current Impairments/barriers affecting progress:  severity of deficits    OT Frequency  2x / week    OT Duration  8 weeks    OT Treatment/Interventions  Self-care/ADL training;Moist Heat;Therapeutic  exercise;Neuromuscular education;DME and/or AE instruction;Manual Therapy;Functional Mobility Training;Passive range of motion;Therapeutic activities;Splinting;Patient/family education;Balance training    Plan  NMR trunk, sit to stand, stand to sit, strategies for ADL, explore options for commode    Consulted and Agree with Plan of Care  Patient;Family member/caregiver    Family Member Consulted  [name]       Patient will benefit from skilled therapeutic intervention in order to improve the following deficits and impairments:  Abnormal gait, Decreased activity tolerance, Decreased balance, Decreased cognition, Decreased knowledge of use of DME, Decreased mobility, Decreased range of motion, Decreased safety awareness, Difficulty walking, Decreased strength, Impaired UE functional use, Impaired tone, Impaired sensation, Pain  Visit Diagnosis: Hemiplegia and hemiparesis following cerebral infarction affecting right dominant side (HCC)  Abnormal posture  Muscle weakness (generalized)  Unsteadiness on feet  Other symptoms and signs involving cognitive functions following cerebral infarction  Other symptoms and signs involving the nervous system  Apraxia  Other symptoms and signs involving the musculoskeletal system    Problem List There are no active problems to display for this patient.   Norton Pastel, OTR/L 12/07/2017, 5:11 PM  [facility] [address] [place], [place], [address] Phone: [phone]   Fax:  [phone]  Name: Tim Stephens MRN: [id number] Date of Birth: [birth year]

## 2017-12-07 NOTE — Patient Instructions (Signed)
Bathing your upper body:  Sit in the wheelchair on on the edge of your bed.   Use your left hand to wash your face, chest, stomach and right arm from hand to shoulder Lean forward and put your right hand on your knee then reach up under and wash your right arm pit. Place washcloth on left knee and rub left arm over the washcloth.  You can get up higher on your arm by leaning forward.  Use a long handled sponge to wash your back.  To dress/undress:  Sit in your wheelchair and scoot forward a little so your trunk is active.  To take off shirt:  Pull over your head first and then take off your arms. To put shirt on:  Dress right arm first then put your left arm in.  Pull shirt all the way up to your shoulders. Then put over your head.  If you can dress in front of a mirror that will help.  It will probably get stuck around your right shoulder to you will need to push the shirt back over the shoulder.  Then pull it down.

## 2017-12-08 ENCOUNTER — Ambulatory Visit: Payer: BLUE CROSS/BLUE SHIELD

## 2017-12-08 NOTE — Therapy (Signed)
[facility] [address] [place], [place], [address] Phone: [phone]   Fax:  [phone]  Physical Therapy Treatment  Patient Details  Name: Tim Stephens MRN: [id number] Date of Birth: 1955-08-10 Referring Provider: Jenita Seashore, MD   Encounter Date: 12/07/2017  PT End of Session - 12/08/17 1343    Visit Number  4    Number of Visits  33    Date for PT Re-Evaluation  01/28/18    Authorization Type  BCBS- initial 8 visits approved/can ask for more    Authorization Time Period  11/28/17-01/28/18    Authorization - Visit Number  4    Authorization - Number of Visits  8    PT Start Time  1405    PT Stop Time  1445    PT Time Calculation (min)  40 min    Equipment Utilized During Treatment  Gait belt    Activity Tolerance  Patient tolerated treatment well;Other (comment) BP measures:  112/84; 122/89 (after standing in parallel bars)    Behavior During Therapy  [facility] for tasks assessed/performed       No past medical history on file.  No past surgical history on file.  There were no vitals filed for this visit.  Subjective Assessment - 12/08/17 1329    Subjective  Per wife:  pt got stitches out yesterday.  Asked patient how he is doing:  "fine"    Patient is accompained by:  Family member wife    Pertinent History  Recently, pt on hold for PT due to medical issues and having skull flap surgery.  [name] is a 62 year old right hand dominant male with past medical history significant for hypertension and hyperlipidemia who was admitted to [facility] on 05/30/2017 with a aphasia and right hemibody weakness. CTA was obtained and revealed left M1 cutoff. He was taken to IR by Dr. Lionel December for cerebral angiography with mechanical thrombectomy. The patient's acute hospital course was complicated by worsening cytotoxic edema as well areas of hemorrhagic conversion with left to right midline shift. He was taken to the OR on  06/01/2017 for decompressive left hemicraniectomy. [name] He was found to have a right peroneal, right posterior tibial, right gastrocnemius,left basilic, and left axillary venous thrombi. He then developed totally occluding superficial thrombophlebitis of the left cephalic vein and right cephalic vein. Venous thrombus then progressed in the left upper arm to the left brachial vein. The patient was not anticoagulated due to recent hemorrhagic conversion of stroke. He underwent serial scans to evaluate for progression of venous thrombosis. PEG was placed by Dr. Allayne Butcher on 06/15/2017. There was progression of right lower extremity DVT from the posterior tibial vein to the right common femoral vein. Vascular surgery was consulted and placed an inferior vena cava filter on 06/18/2017. expressive aphasia, verbal apraxia; therapy at [facility] January 2019, then [facility] rehab for 4 weeks, then [facility] therapy PT, OT, speech discharged 09/21/17    Patient Stated Goals  Per wife:  to get strong enough to get upstairs, and use the bathroom on his own; trying to be more mobile outside the home-fishing, more recreational things Pt agrees to these goals    Currently in Pain?  No/denies                       [facility] Adult PT Treatment/Exercise - 12/08/17 0001      Bed Mobility   Bed Mobility  Sit to Supine;Supine to  Sit    Supine to Sit  3: Mod assist Has difficulty with hand placement, as he uses rail at home    Sit to Supine  3: Mod assist for lower extremity management      Transfers   Transfers  Sit to Stand;Stand to Sit;Stand Pivot Transfers    Sit to Stand  4: Min assist;With upper extremity assist;From chair/3-in-1    Sit to Stand Details  Verbal cues for sequencing;Verbal cues for technique VCs for hand placement-he wants to pull from bars    Sit to Stand Details (indicate cue type and reason)  Sit<>stand in parallel bars    Stand to Sit  4: Min assist;With upper extremity assist;To  chair/3-in-1    Stand to Sit Details (indicate cue type and reason)  Verbal cues for sequencing;Verbal cues for technique    Stand Pivot Transfers  3: Mod assist    Stand Pivot Transfer Details (indicate cue type and reason)  Wheelchair>mat towards L side    Comments  Pt stands for at least 2 minutes in parallel bars with LUE support, cues and initial assistance for upright posture and lateral weightshifting to R then return to midline, 2 sets x 5 reps      Posture/Postural Control   Posture/Postural Control  Postural limitations    Postural Limitations  Rounded Shoulders;Posterior pelvic tilt    Posture Comments  At edge of wheelchair, then seated at edge of mat, worked on seated anterior>posterior pelvic tilts x 5 reps, multiple sets, then holding neutral upright posture in sitting without back supported      Exercises   Exercises  Knee/Hip      Knee/Hip Exercises: Seated   Long Arc Quad  AAROM;Strengthening;Right;2 sets;5 reps    Long Arc Quad Limitations  passive ROM with extension, resistance given back to flexion to activate improved quad musclulature x first 3 reps, then pt actively extends x 2 reps (cues for breathing, not to hold breath and strain during exercise)    Marching  AROM;AAROM;Right;2 sets;5 reps    Marching Limitations  Cues for patient not to hold breath during each rep      Knee/Hip Exercises: Supine   Heel Slides  AAROM;Right;2 sets;5 reps    Other Supine Knee/Hip Exercises  Hip abduction 2 sets x 10 reps-A/AROM; then hooklying marching alternating legs, A/AROM for control of movements, 2 sets x 10 reps    Other Supine Knee/Hip Exercises  Instructed wife in above exercises and she demo understanding of assistance for guidance, control of exercises, as pt is able to initiated movements      -Seated posture exercises at edge of wheelchair:  With upright posture, scapular squeezes x 5 reps -Scooting to edge of wheelchair with UE support and min assist       PT  Education - 12/08/17 1342    Education provided  Yes    Education Details  Educated in [employer] instructions    Person(s) Educated  Patient;Spouse    Methods  Explanation;Demonstration;Handout    Comprehension  Verbalized understanding;Returned demonstration;Need further instruction;Verbal cues required       PT Short Term Goals - 11/29/17 1118      PT SHORT TERM GOAL #1   Title  Pt will perform HEP with wife's supervision for improved balance, strength, gait.  TARGET 12/29/17    Time  4    Period  Weeks    Status  On-going    Target Date  12/29/17  PT SHORT TERM GOAL #2   Title  Pt will perform sit to stand with supervision, for improved transfer efficiency and safety with gait.    Time  4    Period  Weeks    Status  On-going    Target Date  12/29/17      PT SHORT TERM GOAL #3   Title  Pt will ambulate at least 100 ft using hemiwalker with minimal assistance for improved gait safety and household mobility.    Time  4    Period  Weeks    Status  On-going    Target Date  12/29/17      PT SHORT TERM GOAL #4   Title  Stand pivot transfer to be assessed, with goal to be written as appropriate (as wife is currently assisting at home-did not assess at re-eval)    Time  4    Period  Weeks    Status  On-going    Target Date  12/29/17      PT SHORT TERM GOAL #5   Title  Pt/wife will verbalize understanding of fall prevention in home environment.    Time  4    Period  Weeks    Status  On-going    Target Date  12/29/17      PT SHORT TERM GOAL #6   Title  Berg score to increase by at least 8 points, for improved standing balance and decreased fall risk.    Time  4    Period  Weeks    Status  New        PT Long Term Goals - 11/29/17 1119      PT LONG TERM GOAL #1   Title  Pt/wife will verbalize plans for continued community fitness upon d/c from PT.  TARGET 01/28/18    Time  8    Period  Weeks    Status  On-going    Target Date  01/28/18      PT LONG TERM GOAL #2    Title  Pt will perform sit to stand transfers modified independently, for improved safety and independence with transfers.    Time  8    Period  Weeks    Status  On-going    Target Date  01/28/18      PT LONG TERM GOAL #3   Title  Pt will ambulate at least 300 ft using least restrictive assistive device, with supervision, for improved safety and independence with gait.    Time  8    Period  Weeks    Status  Revised    Target Date  01/28/18      PT LONG TERM GOAL #4   Title  GAit velocity to be assessed, with goal to be set as appropriate.    Time  8    Period  Weeks    Status  On-going    Target Date  01/28/18      PT LONG TERM GOAL #5   Title  Pt will negotiate 12 steps using handrail, with supervision, for improved safety and independence with stair negotiation for home.    Time  8    Period  Weeks    Status  On-going    Target Date  01/28/18            Plan - 12/08/17 1344    Clinical Impression Statement  Skilled PT session today focused on transfers and brief standing exercises, as well as initiation of  HEP for wife to assist patient with exercises in supine and postural exercises in sitting.  Pt is able to initiate and move RLE through most of complete ROM; however, he needs assistance for smooth controlled movements and cues to breathe/not hold his breath during each rep of exercise.  Pt will continue to benefit from skilled PT to address postural and lower extremity strength, balance, transfersa nd gait.    Rehab Potential  Good    Clinical Impairments Affecting Rehab Potential  Good family support; severity of deficits    PT Frequency  2x / week    PT Duration  8 weeks    PT Treatment/Interventions  ADLs/Self Care Home Management;DME Instruction;Gait training;Stair training;Functional mobility training;Therapeutic activities;Therapeutic exercise;Balance training;Orthotic Fit/Training;Patient/family education;Neuromuscular re-education    PT Next Visit Plan  Check  BP; review HEP and add for lower extremity and trunk strengthening; work on transfers, gait as able.    Consulted and Agree with Plan of Care  Patient;Family member/caregiver    Family Member Consulted  wife       Patient will benefit from skilled therapeutic intervention in order to improve the following deficits and impairments:  Abnormal gait, Decreased activity tolerance, Decreased balance, Decreased knowledge of precautions, Decreased endurance, Decreased knowledge of use of DME, Decreased mobility, Decreased safety awareness, Difficulty walking, Decreased strength, Impaired tone, Postural dysfunction  Visit Diagnosis: Abnormal posture  Muscle weakness (generalized)  Unsteadiness on feet     Problem List There are no active problems to display for this patient.   Zoejane Gaulin W. 12/08/2017, 1:48 PM  Gean Maidens., PT   [facility] [address] [place], [place], [address] Phone: [phone]   Fax:  [phone]  Name: MERLE CIRELLI MRN: [id number] Date of Birth: 05/29/1956

## 2017-12-12 ENCOUNTER — Encounter: Payer: Self-pay | Admitting: Speech Pathology

## 2017-12-12 ENCOUNTER — Ambulatory Visit: Payer: BLUE CROSS/BLUE SHIELD | Admitting: Speech Pathology

## 2017-12-12 ENCOUNTER — Ambulatory Visit: Payer: BLUE CROSS/BLUE SHIELD | Attending: Family Medicine | Admitting: Physical Therapy

## 2017-12-12 ENCOUNTER — Encounter: Payer: Self-pay | Admitting: Physical Therapy

## 2017-12-12 ENCOUNTER — Encounter: Payer: Self-pay | Admitting: Occupational Therapy

## 2017-12-12 ENCOUNTER — Ambulatory Visit: Payer: BLUE CROSS/BLUE SHIELD | Admitting: Occupational Therapy

## 2017-12-12 DIAGNOSIS — M6281 Muscle weakness (generalized): Secondary | ICD-10-CM | POA: Insufficient documentation

## 2017-12-12 DIAGNOSIS — R2681 Unsteadiness on feet: Secondary | ICD-10-CM | POA: Insufficient documentation

## 2017-12-12 DIAGNOSIS — R4701 Aphasia: Secondary | ICD-10-CM | POA: Diagnosis present

## 2017-12-12 DIAGNOSIS — R29898 Other symptoms and signs involving the musculoskeletal system: Secondary | ICD-10-CM | POA: Insufficient documentation

## 2017-12-12 DIAGNOSIS — R482 Apraxia: Secondary | ICD-10-CM

## 2017-12-12 DIAGNOSIS — R2689 Other abnormalities of gait and mobility: Secondary | ICD-10-CM | POA: Insufficient documentation

## 2017-12-12 DIAGNOSIS — R293 Abnormal posture: Secondary | ICD-10-CM | POA: Insufficient documentation

## 2017-12-12 DIAGNOSIS — I69318 Other symptoms and signs involving cognitive functions following cerebral infarction: Secondary | ICD-10-CM

## 2017-12-12 DIAGNOSIS — R29818 Other symptoms and signs involving the nervous system: Secondary | ICD-10-CM | POA: Diagnosis present

## 2017-12-12 DIAGNOSIS — I69351 Hemiplegia and hemiparesis following cerebral infarction affecting right dominant side: Secondary | ICD-10-CM | POA: Diagnosis present

## 2017-12-12 NOTE — Therapy (Signed)
[facility] [address] [place], [place], [address] Phone: [phone]   Fax:  [phone]  Occupational Therapy Treatment  Patient Details  Name: Tim Stephens MRN: [id number] Date of Birth: April 19, 1956 Referring Provider: Jenita Seashore, MD   Encounter Date: 12/12/2017  OT End of Session - 12/12/17 1245    Visit Number  6    Number of Visits  22    Date for OT Re-Evaluation  01/15/18    Authorization Type  BCBS    Authorization Time Period  pt has been approved 8 visits by 01/28/2018    OT Start Time  1103    OT Stop Time  1145    OT Time Calculation (min)  42 min    Activity Tolerance  Patient tolerated treatment well       History reviewed. No pertinent past medical history.  History reviewed. No pertinent surgical history.  There were no vitals filed for this visit.  Subjective Assessment - 12/12/17 1107    Subjective   Yes when asked if he was doing his UB bathing and dressing    Patient is accompained by:  Family member wife    Pertinent History  MONITOR BP!!!  05/30/2017 admitted to [facility] with LMI cutoff, s/p thromectomy with resultant L CVA due mulitple thromboses;  IVC filter with Coumadin, 06/01/2017 craniotomy    Patient Stated Goals  unable to state due to aphasia    Currently in Pain?  No/denies                   OT Treatments/Exercises (OP) - 12/12/17 0001      ADLs   LB Dressing  Addresed LB dressing strategies.  Suggested pt and wife bath and dress in kitchen to allow more space and ability to safety incoporate sit to stand and standing balance.  With cues and strategies pt able to don and doff R shoe mod I, simulated donning pants and underwear with min a and don sock with min a on RLE.  Pt able to simulate LB bathing with only min a for thoroughness of  buttocks and R foot.  Wife practiced standing with pt duriing this activity and able to safely return demonstrate.  Pt and wife report  pt is doing entire UB bathing and dressing at home and both agreed to begin to work on LB bathing /dressing starting tomorrow. Also discussed transitioning out of pull ups during the day using urinal (pt is incomfortable on seat of 3 in 1 commode) sitting edge of chair or in standing with wife's assistance as well as using timed toileting. Pt to continue to use brief at night for now. Pt and wife know they have option of condom cath as well.        Neurological Re-education Exercises   Other Exercises 1  Using functional kitchen actiivity, addressed sit to stand, static and dynamic standing balance without UE support, stand to squat to stand, and then stand to sit using only LE's.  Also addressed approaching midline and increasing RLE activity in standing.  [name] BP relatively stable today (121/90 sitting, 137/92 standing and 118/86 after prolonged standing activity)  Pt asymptomatic.              OT Education - 12/12/17 1243    Education provided  Yes    Education Details  LB bathing and dressing    Person(s) Educated  Patient;Spouse    Methods  Explanation;Demonstration wife refused written  handout   wife refused written handout   Comprehension  Verbalized understanding;Returned demonstration       OT Short Term Goals - 12/12/17 1243      OT SHORT TERM GOAL #1   Title  Pt and wife will be mod I with HEP for RUE ROM - 12/26/2017    Status  On-going      OT SHORT TERM GOAL #2   Title  Pt will be min a for grooming with mod cues from wife and intermittent set up prn    Status  Achieved      OT SHORT TERM GOAL #3   Title  Pt will be mod a for UB bathing at wheelchair level with mod cues    Status  Achieved      OT SHORT TERM GOAL #4   Title  Pt will be mod a for LB bathing at sit to stand level  with mod cues and set up    Status  Achieved      OT SHORT TERM GOAL #5   Title  Pt will be max a for UB dressing at wheelchair level, mod cues    Status  Achieved      OT SHORT TERM  GOAL #6   Title  Pt and wife will explore options for 3 in 1 commode for pt to use in bathroom or at bedside    Status  On-going      OT SHORT TERM GOAL #7   Title  Pt will use RUE as stabilizer 25% of the time during basic self care skills with min a and cues.     Status  On-going        OT Long Term Goals - 12/12/17 1244      OT LONG TERM GOAL #1   Title  Pt and wife will be mod I with home activity program designed to increase independence in basic self care as well as address cognition - 01/23/2018    Status  On-going      OT LONG TERM GOAL #2   Title  Pt will be min a for UB bathing after set up with mod cues    Status  Achieved      OT LONG TERM GOAL #3   Title  Pt will be min a for UB dressing with mod cues    Status  Achieved      OT LONG TERM GOAL #4   Title  Pt will be mod a for LB bathing with mod cues    Status  On-going      OT LONG TERM GOAL #5   Title  Pt will be mod a for LB dressing with mod cues    Status  On-going      OT LONG TERM GOAL #6   Title  Pt will be min a for toilet transfers in bathroom (able to ambulate 3 steps into bathroom with wife).      Status  On-going      OT LONG TERM GOAL #7   Title  Pt will be mod a for clothing mmgt with toileting    Status  On-going      OT LONG TERM GOAL #8   Title  Pt will demonstrate ability to use RUE as stabilizer during basic self care 50% of the time with min a and cues.     Status  On-going            Plan -  12/12/17 1244    Clinical Impression Statement  Pt progressing toward goals.  Pt making good gains in basic ADL tasks.     Occupational Profile and client history currently impacting functional performance  HLD, HTN, s/p craniotomy, IVC filter with Coumadin, seizures.    Occupational performance deficits (Please refer to evaluation for details):  ADL's;IADL's;Rest and Sleep;Work;Leisure;Social Participation    Rehab Potential  Good    Current Impairments/barriers affecting progress:   severity of deficits    OT Frequency  2x / week    OT Duration  8 weeks    OT Treatment/Interventions  Self-care/ADL training;Moist Heat;Therapeutic exercise;Neuromuscular education;DME and/or AE instruction;Manual Therapy;Functional Mobility Training;Passive range of motion;Therapeutic activities;Splinting;Patient/family education;Balance training    Plan  check LB ADL status, NMR trunk/RUE, sit to stand, stand to sit, explore options for commode    Consulted and Agree with Plan of Care  Patient;Family member/caregiver    Family Member Consulted  [name]       Patient will benefit from skilled therapeutic intervention in order to improve the following deficits and impairments:  Abnormal gait, Decreased activity tolerance, Decreased balance, Decreased cognition, Decreased knowledge of use of DME, Decreased mobility, Decreased range of motion, Decreased safety awareness, Difficulty walking, Decreased strength, Impaired UE functional use, Impaired tone, Impaired sensation, Pain  Visit Diagnosis: Hemiplegia and hemiparesis following cerebral infarction affecting right dominant side (HCC)  Abnormal posture  Muscle weakness (generalized)  Unsteadiness on feet  Other symptoms and signs involving cognitive functions following cerebral infarction  Other symptoms and signs involving the nervous system  Apraxia  Other symptoms and signs involving the musculoskeletal system    Problem List There are no active problems to display for this patient.   Norton Pastelulaski, Karen Halliday, OTR/L 12/12/2017, 12:49 PM  [facility] [address] [place], [place], [address] Phone: [phone]   Fax:  [phone]  Name: Tim Stephens MRN: [id number] Date of Birth: 03/29/1956

## 2017-12-12 NOTE — Therapy (Signed)
[facility] [address] [place], [place], [address] Phone: [phone]   Fax:  [phone]  Physical Therapy Treatment  Patient Details  Name: SERAPHIM TROW MRN: [id number] Date of Birth: 22-Feb-1956 Referring Provider: Jenita Seashore, MD   Encounter Date: 12/12/2017  PT End of Session - 12/12/17 1714    Visit Number  5    Number of Visits  33    Date for PT Re-Evaluation  01/28/18    Authorization Type  BCBS- initial 8 visits approved/can ask for more    Authorization Time Period  11/28/17-01/28/18    Authorization - Visit Number  5    Authorization - Number of Visits  8    PT Start Time  1148    PT Stop Time  1232    PT Time Calculation (min)  44 min    Equipment Utilized During Treatment  Gait belt    Activity Tolerance  Patient tolerated treatment well;Other (comment) 130/91 after initial gait    Behavior During Therapy  [facility] for tasks assessed/performed       History reviewed. No pertinent past medical history.  History reviewed. No pertinent surgical history.  There were no vitals filed for this visit.  Subjective Assessment - 12/12/17 1150    Subjective  Wife reports they have been doing exercises at home.  They did a little walking yesterday with the hemiwalker.    Patient is accompained by:  Family member wife    Pertinent History  Recently, pt on hold for PT due to medical issues and having skull flap surgery.  Lem Peary is a 62 year old right hand dominant male with past medical history significant for hypertension and hyperlipidemia who was admitted to [facility] on 05/30/2017 with a aphasia and right hemibody weakness. CTA was obtained and revealed left M1 cutoff. He was taken to IR by Dr. Lionel December for cerebral angiography with mechanical thrombectomy. The patient's acute hospital course was complicated by worsening cytotoxic edema as well areas of hemorrhagic conversion with left to right midline shift. He was  taken to the OR on 06/01/2017 for decompressive left hemicraniectomy. [name] He was found to have a right peroneal, right posterior tibial, right gastrocnemius,left basilic, and left axillary venous thrombi. He then developed totally occluding superficial thrombophlebitis of the left cephalic vein and right cephalic vein. Venous thrombus then progressed in the left upper arm to the left brachial vein. The patient was not anticoagulated due to recent hemorrhagic conversion of stroke. He underwent serial scans to evaluate for progression of venous thrombosis. PEG was placed by Dr. Allayne Butcher on 06/15/2017. There was progression of right lower extremity DVT from the posterior tibial vein to the right common femoral vein. Vascular surgery was consulted and placed an inferior vena cava filter on 06/18/2017. expressive aphasia, verbal apraxia; therapy at [facility] January 2019, then [facility] rehab for 4 weeks, then [facility] therapy PT, OT, speech discharged 09/21/17    Patient Stated Goals  Per wife:  to get strong enough to get upstairs, and use the bathroom on his own; trying to be more mobile outside the home-fishing, more recreational things Pt agrees to these goals    Currently in Pain?  No/denies                    Neurore-education:  Provided NMR with supine>sidelying>sit, push up through L elbow for improved bed mobility. Verbal cues to actively use RLE when preparing to get out of wheelchair or  getting in/out of bed, not using LLE to help RLE as much.  PNF patterns in flexion/extension in supine, to RLE, with minimal resistance given to assist muscle re-ed RLE With initiation of SLR, PT provides P/ROM into SLR with cues for resisted eccentric return to mat, to assist with recruitment of quad musculature.   [facility] Adult PT Treatment/Exercise - 12/12/17 0001      Bed Mobility   Bed Mobility  Sit to Supine;Supine to Sit    Supine to Sit  Moderate Assistance - Patient 50-74% Assist to roll  to L side>sidelying to sit    Sit to Supine  Minimal Assistance - Patient > 75%      Transfers   Transfers  Sit to Stand;Stand to Sit;Stand Pivot Transfers    Sit to Stand  4: Min guard;With upper extremity assist;From chair/3-in-1    Sit to Stand Details  Verbal cues for sequencing;Verbal cues for technique    Stand to Sit  4: Min assist;With upper extremity assist;To chair/3-in-1    Stand Pivot Transfers  4: Min assist    Stand Pivot Transfer Details (indicate cue type and reason)  Wheelchair>mat      Ambulation/Gait   Ambulation/Gait  Yes    Ambulation/Gait Assistance  4: Min assist    Ambulation/Gait Assistance Details  Cues for increased RLE stance time for increased L step length    Ambulation Distance (Feet)  50 Feet then 15 ft with hemiwalker    Assistive device  Hemi-walker    Gait Pattern  Step-to pattern;Decreased step length - right;Decreased stance time - right;Decreased dorsiflexion - right;Right genu recurvatum;Lateral trunk lean to left;Poor foot clearance - right begins step-through pattern with cues    Ambulation Surface  Level;Indoor    Gait Comments  Also performed gait trial x 20 ft, with therapist in front of patient, LUE on PT shoulder, with facilitation from additional therapist behind paient for improved weightshfit to RLE to allow improved L step length. Pt requires mod -assist      Knee/Hip Exercises: Supine   Short Arc Quad Sets  Strengthening;Right;1 set;5 sets    Straight Leg Raises  Strengthening;Right;4 sets;5 reps Tactile cues and assist for maintaining knee extension    Other Supine Knee/Hip Exercises  Wife demo/reviewed HEP from last visit:  hooklying marching, heelslides and hip abduction.  Verbal cues given for technique for hip abduction.  Wife verbalized understanding.    Other Supine Knee/Hip Exercises  Hip internal rotation, bilaterally x 5 reps             PT Education - 12/12/17 1712    Education provided  Yes    Education Details   Addition to HEP; cues for gait assistance/mechanics to try for step through pattern with gait; mindful for R foot clearance    Person(s) Educated  Patient;Spouse    Methods  Explanation;Demonstration    Comprehension  Verbalized understanding;Returned demonstration       PT Short Term Goals - 11/29/17 1118      PT SHORT TERM GOAL #1   Title  Pt will perform HEP with wife's supervision for improved balance, strength, gait.  TARGET 12/29/17    Time  4    Period  Weeks    Status  On-going    Target Date  12/29/17      PT SHORT TERM GOAL #2   Title  Pt will perform sit to stand with supervision, for improved transfer efficiency and safety with gait.  Time  4    Period  Weeks    Status  On-going    Target Date  12/29/17      PT SHORT TERM GOAL #3   Title  Pt will ambulate at least 100 ft using hemiwalker with minimal assistance for improved gait safety and household mobility.    Time  4    Period  Weeks    Status  On-going    Target Date  12/29/17      PT SHORT TERM GOAL #4   Title  Stand pivot transfer to be assessed, with goal to be written as appropriate (as wife is currently assisting at home-did not assess at re-eval)    Time  4    Period  Weeks    Status  On-going    Target Date  12/29/17      PT SHORT TERM GOAL #5   Title  Pt/wife will verbalize understanding of fall prevention in home environment.    Time  4    Period  Weeks    Status  On-going    Target Date  12/29/17      PT SHORT TERM GOAL #6   Title  Berg score to increase by at least 8 points, for improved standing balance and decreased fall risk.    Time  4    Period  Weeks    Status  New        PT Long Term Goals - 11/29/17 1119      PT LONG TERM GOAL #1   Title  Pt/wife will verbalize plans for continued community fitness upon d/c from PT.  TARGET 01/28/18    Time  8    Period  Weeks    Status  On-going    Target Date  01/28/18      PT LONG TERM GOAL #2   Title  Pt will perform sit to stand  transfers modified independently, for improved safety and independence with transfers.    Time  8    Period  Weeks    Status  On-going    Target Date  01/28/18      PT LONG TERM GOAL #3   Title  Pt will ambulate at least 300 ft using least restrictive assistive device, with supervision, for improved safety and independence with gait.    Time  8    Period  Weeks    Status  Revised    Target Date  01/28/18      PT LONG TERM GOAL #4   Title  GAit velocity to be assessed, with goal to be set as appropriate.    Time  8    Period  Weeks    Status  On-going    Target Date  01/28/18      PT LONG TERM GOAL #5   Title  Pt will negotiate 12 steps using handrail, with supervision, for improved safety and independence with stair negotiation for home.    Time  8    Period  Weeks    Status  On-going    Target Date  01/28/18            Plan - 12/12/17 1714    Clinical Impression Statement  Skilled PT session today addressed reviewing HEP and working on additional exercises for RLE strengthening as well as gait training.  Pt able to ambulate 3 trials in therapy today, and is able to demo step-through pattern about 25% of the time.  With  fatigue, pt demo decreased RLE foot clearance and occasional R knee recurvatum.  May benefit from trial of foot-up brace or AFO trial to help with R LE foot clearance and stability with gait.    Rehab Potential  Good    Clinical Impairments Affecting Rehab Potential  Good family support; severity of deficits    PT Frequency  2x / week    PT Duration  8 weeks    PT Treatment/Interventions  ADLs/Self Care Home Management;DME Instruction;Gait training;Stair training;Functional mobility training;Therapeutic activities;Therapeutic exercise;Balance training;Orthotic Fit/Training;Patient/family education;Neuromuscular re-education    PT Next Visit Plan  Check BP; standing quad/glut strengthening on RLE in weightbearing; gait training with foot-up versus AFO trial.     Consulted and Agree with Plan of Care  Patient;Family member/caregiver    Family Member Consulted  wife       Patient will benefit from skilled therapeutic intervention in order to improve the following deficits and impairments:  Abnormal gait, Decreased activity tolerance, Decreased balance, Decreased knowledge of precautions, Decreased endurance, Decreased knowledge of use of DME, Decreased mobility, Decreased safety awareness, Difficulty walking, Decreased strength, Impaired tone, Postural dysfunction  Visit Diagnosis: Other abnormalities of gait and mobility  Muscle weakness (generalized)  Unsteadiness on feet     Problem List There are no active problems to display for this patient.   Johnasia Liese W. 12/12/2017, 5:20 PM  Gean MaidensMARRIOTT,Tommaso Cavitt W., PT   [facility] [address] [place], [place], [address] Phone: [phone]   Fax:  [phone]  Name: Pennelope BrackenJames M Mickiewicz MRN: [id number] Date of Birth: 02/03/1956

## 2017-12-12 NOTE — Patient Instructions (Signed)
Hip internal rotation:  Lying on back with knees straight-"roll" your feet in so your toes are pointed to the ceiling.  Hold this position for 3 seconds, then relax.  Repeat 10 times.

## 2017-12-12 NOTE — Therapy (Signed)
[facility] [address] [place], [place], [address] Phone: [phone]   Fax:  [phone]  Speech Language Pathology Treatment  Patient Details  Name: Tim Stephens MRN: [id number] Date of Birth: 22-Jun-1956 Referring Provider: Dr. Jenita Seashore   Encounter Date: 12/12/2017  End of Session - 12/12/17 1223    Visit Number  6    Number of Visits  17    Date for SLP Re-Evaluation  01/24/18    Authorization Type  30 total ST visits, no auth. Per spouse HHST used 8 visits (22 remaining; 19 after eval and visit 1). Spouse agrees to leave 4 visits prior to end of year. See auth visits    Authorization - Visit Number  15    Authorization - Number of Visits  26    SLP Start Time  1016    SLP Stop Time   1100    SLP Time Calculation (min)  44 min    Activity Tolerance  Patient tolerated treatment well;Other (comment)       History reviewed. No pertinent past medical history.  History reviewed. No pertinent surgical history.  There were no vitals filed for this visit.  Subjective Assessment - 12/12/17 1211    Subjective  "We are filing a court motion, I need a letter from you"    Patient is accompained by:  Family member spouse [name]    Currently in Pain?  No/denies            ADULT SLP TREATMENT - 12/12/17 1211      General Information   Behavior/Cognition  Alert;Cooperative;Pleasant mood      Treatment Provided   Treatment provided  Cognitive-Linquistic      Cognitive-Linquistic Treatment   Treatment focused on  Aphasia;Apraxia    Skilled Treatment  Veral expression (apraxia) targeted with cv syllables with phonemes /sh, f,v,l,th voiced and voiceless)) Iniitally pt repeated syllables in unison, with fading to placement cues and eventuallly with independence. Today, pt also produced cvc words with target phonemes, as well as combing cv-cv phonemes with same consonant and alternating vowel with aforementioned  cueing hierarchy. Adminstered subtest of RCBA (Reading Comprhension Battery for Aphasia). Pt accuate reading comprehension at word level in word-visual and workd-auditory subtest, with extended time for processing. On word-semantic subtest, pt experienced more difficulty, required more time and self corrected 2 responses, he got 8/10 correct. Functional reading (letters, perscriptions, weather etc less than 50% accurate. Simple sentence reading comprehension 60% on RCBA. Increase in spontaneous, conversational responses, however functional speech/language continues to be severely impaired.       Assessment / Recommendations / Plan   Plan  Continue with current plan of care      Progression Toward Goals   Progression toward goals  Progressing toward goals         SLP Short Term Goals - 12/12/17 1223      SLP SHORT TERM GOAL #1   Title  Pt will ID object f:5 to sentence description with 90% accuracy and occasional min A    Time  2    Period  Weeks    Status  On-going      SLP SHORT TERM GOAL #2   Title  Pt will complete automatic speech tasks with 70% accuracy and visual cues over 3 sessions    Time  2    Period  Weeks    Status  On-going      SLP SHORT TERM GOAL #3  Title  Pt will utilize multimodal communication and AAC to communicate wants and answer questions with 70% accuracy and occasional min A over 3 sessions    Time  2    Period  Weeks    Status  On-going      SLP SHORT TERM GOAL #4   Title  Pt will write/type at word level with 80% accuracy and occasional min A over 2 sessions    Time  2    Period  Weeks    Status  On-going       SLP Long Term Goals - 12/12/17 1223      SLP LONG TERM GOAL #1   Title  Pt will comprehend mildly complex conversation over 8 minute conversation with appropriate responses (mulitmodal) and 2 or less requests for repetition    Time  6    Period  Weeks    Status  On-going      SLP LONG TERM GOAL #2   Title  Pt will utilize multimodal  communication/ACC to produce 2 word response to conversation/question 8/10x with occasional min A    Time  6    Period  Weeks    Status  On-going      SLP LONG TERM GOAL #3   Title  Pt will write 2 word phrases with 80% accuracy and occasional min A    Time  6    Period  Weeks    Status  On-going      SLP LONG TERM GOAL #4   Title  Pt will produce 1-2 syllable words in phrase completion task with 70% accuracy and occassional mod A    Time  6    Period  Weeks    Status  On-going       Plan - 12/12/17 1220    Clinical Impression Statement  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 Portions of RCBA administered today - see skilled intervention for results. I continue to recommend speech generating device as reading at simple word level and cognition are intact. Continue skilled ST to maximize communication for independence, QOL and to reduce caregiver burden    Speech Therapy Frequency  2x / week    Treatment/Interventions  Language facilitation;Environmental controls;Cueing hierarchy;Oral motor exercises;SLP instruction and feedback;Compensatory strategies;Functional tasks;Cognitive reorganization;Internal/external aids;Multimodal communcation approach;Patient/family education;Other (comment)    Potential Considerations  Severity of impairments    Consulted and Agree with Plan of Care  Patient;Family member/caregiver    Family Member Consulted  spouse, Shanda BumpsJessica       Patient will benefit from skilled therapeutic intervention in order to improve the following deficits and impairments:   Aphasia  Verbal apraxia    Problem List There are no active problems to display for this patient.   Ersa Delaney, Radene JourneyLaura Ann MS, CCC-SLP 12/12/2017, 12:24 PM  [facility] [address] [place], [place], [address] Phone: [phone]   Fax:  [phone]   Name: Tim BrackenJames M  Stephens MRN: [id number] Date of Birth: 04/10/1956

## 2017-12-21 ENCOUNTER — Ambulatory Visit: Payer: BLUE CROSS/BLUE SHIELD

## 2017-12-21 DIAGNOSIS — R2689 Other abnormalities of gait and mobility: Secondary | ICD-10-CM | POA: Diagnosis not present

## 2017-12-21 DIAGNOSIS — R4701 Aphasia: Secondary | ICD-10-CM

## 2017-12-21 DIAGNOSIS — R482 Apraxia: Secondary | ICD-10-CM

## 2017-12-21 NOTE — Therapy (Signed)
[place] [address] [place], [place], [address] Phone: [phone]   Fax:  [phone]  Speech Language Pathology Treatment  Patient Details  Name: Tim Stephens MRN: [id number] Date of Birth: [birth year] Referring Provider: Dr. Scarlette Ar   Encounter Date: 12/21/2017  End of Session - 12/21/17 1523    Visit Number  7    Number of Visits  17    Date for SLP Re-Evaluation  01/24/18    Authorization - Visit Number  16    Authorization - Number of Visits  26    SLP Start Time  1320    SLP Stop Time   1402    SLP Time Calculation (min)  42 min    Activity Tolerance  Patient tolerated treatment well       No past medical history on file.  No past surgical history on file.  There were no vitals filed for this visit.  Subjective Assessment - 12/21/17 1324    Subjective  Wife indicates that they are working on getting pt familiar with [employer].    Patient is accompained by:  Family member wife    Currently in Pain?  No/denies            ADULT SLP TREATMENT - 12/21/17 1325      General Information   Behavior/Cognition  Alert;Cooperative;Pleasant mood      Treatment Provided   Treatment provided  Cognitive-Linquistic      Cognitive-Linquistic Treatment   Treatment focused on  Aphasia;Apraxia    Skilled Treatment  Wife relates to SLP that [facility] is not an option at this time for pt. SLP notified that it could be an option for the future. Rote speech (DOW)completed with max A and approximately 30% success, with slowed rate incr'd to approx 40%. Pt perseverated with numbers so told wife NOT to practice #s 1-10, if they want to do #s focus on 11-20. encouraged practice with DOW at home. Pt with functional artic of target items 55% with max A and repeat attempts up to x5 allowed (this was necessary x2). SLP educated pt wife on how to visually and gesturally cue pt in consonant-vowel word drill/practice.       Assessment / Recommendations / Plan   Plan  Continue with current plan of care      Progression Toward Goals   Progression toward goals  Progressing toward goals       SLP Education - 12/21/17 1522    Education provided  Yes    Education Details  what to practice at home, how to cue pt with consonant-vowel productions (visual, gesture cues)    Person(s) Educated  Patient;Spouse    Methods  Explanation;Demonstration    Comprehension  Returned demonstration;Verbalized understanding       SLP Short Term Goals - 12/21/17 1525      SLP SHORT TERM GOAL #1   Title  Pt will ID object f:5 to sentence description with 90% accuracy and occasional min A    Time  1    Period  Weeks    Status  Not Met      SLP SHORT TERM GOAL #2   Title  Pt will complete automatic speech tasks with 70% accuracy and visual cues over 3 sessions    Time  1    Period  Weeks    Status  Not Met      SLP SHORT TERM GOAL #3   Title  Pt  will utilize multimodal communication and AAC to communicate wants and answer questions with 70% accuracy and occasional min A over 3 sessions    Time  1    Period  Weeks    Status  Not Met      SLP SHORT TERM GOAL #4   Title  Pt will write/type at word level with 80% accuracy and occasional min A over 2 sessions    Time  1    Period  Weeks    Status  Not Met       SLP Long Term Goals - 12/21/17 [place] #1   Title  Pt will comprehend mildly complex conversation over 8 minute conversation with appropriate responses (mulitmodal) and 2 or less requests for repetition    Time  5    Period  Weeks    Status  On-going      SLP LONG TERM GOAL #2   Title  Pt will utilize multimodal communication/ACC to produce 2 word response to conversation/question 8/10x with occasional min A    Time  5    Period  Weeks    Status  On-going      SLP LONG TERM GOAL #3   Title  Pt will write 2 word phrases with 80% accuracy and occasional min A    Time  5     Period  Weeks    Status  On-going      SLP LONG TERM GOAL #4   Title  Pt will produce 1-2 syllable words in phrase completion task with 70% accuracy and occassional mod A    Time  5    Period  Weeks    Status  On-going       Plan - 12/21/17 1524    Clinical Impression Statement  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 Pt is no longer candidate for [employer] due to financial concerns. See "skilled treatment" for more details. I continue to recommend speech generating device as reading at simple word level and cognition are intact. Continue skilled ST to maximize communication for independence, QOL and to reduce caregiver burden    Speech Therapy Frequency  2x / week    Treatment/Interventions  Language facilitation;Environmental controls;Cueing hierarchy;Oral motor exercises;SLP instruction and feedback;Compensatory strategies;Functional tasks;Cognitive reorganization;Internal/external aids;Multimodal communcation approach;Patient/family education;Other (comment)    Potential Considerations  Severity of impairments    Consulted and Agree with Plan of Care  Patient;Family member/caregiver    Family Member Consulted  spouse, Tim Stephens       Patient will benefit from skilled therapeutic intervention in order to improve the following deficits and impairments:   Aphasia  Verbal apraxia    Problem List There are no active problems to display for this patient.   [facility] ,[place]  12/21/2017, 3:27 PM  [place] [address] [place], [place], [address] Phone: [phone]   Fax:  [phone]   Name: Tim Stephens MRN: [id number] Date of Birth: March 29, 1956

## 2017-12-21 NOTE — Patient Instructions (Signed)
  [employer] Go - - app for expressive language in [employer]

## 2018-01-02 ENCOUNTER — Encounter: Payer: Self-pay | Admitting: Speech Pathology

## 2018-01-02 ENCOUNTER — Ambulatory Visit: Payer: BLUE CROSS/BLUE SHIELD | Admitting: Speech Pathology

## 2018-01-02 ENCOUNTER — Encounter: Payer: Self-pay | Admitting: Occupational Therapy

## 2018-01-02 ENCOUNTER — Ambulatory Visit: Payer: BLUE CROSS/BLUE SHIELD | Admitting: Occupational Therapy

## 2018-01-02 DIAGNOSIS — R482 Apraxia: Secondary | ICD-10-CM

## 2018-01-02 DIAGNOSIS — I69351 Hemiplegia and hemiparesis following cerebral infarction affecting right dominant side: Secondary | ICD-10-CM

## 2018-01-02 DIAGNOSIS — R293 Abnormal posture: Secondary | ICD-10-CM

## 2018-01-02 DIAGNOSIS — R29818 Other symptoms and signs involving the nervous system: Secondary | ICD-10-CM

## 2018-01-02 DIAGNOSIS — R2689 Other abnormalities of gait and mobility: Secondary | ICD-10-CM | POA: Diagnosis not present

## 2018-01-02 DIAGNOSIS — R4701 Aphasia: Secondary | ICD-10-CM

## 2018-01-02 DIAGNOSIS — R2681 Unsteadiness on feet: Secondary | ICD-10-CM

## 2018-01-02 DIAGNOSIS — I69318 Other symptoms and signs involving cognitive functions following cerebral infarction: Secondary | ICD-10-CM

## 2018-01-02 DIAGNOSIS — R29898 Other symptoms and signs involving the musculoskeletal system: Secondary | ICD-10-CM

## 2018-01-02 DIAGNOSIS — M6281 Muscle weakness (generalized): Secondary | ICD-10-CM

## 2018-01-02 NOTE — Patient Instructions (Signed)
  Write names, simple objects, favorite items  Fill in the QuinlanBlank  Spell it and say it (fill in the blank)  Erase and have Rosanne AshingJim write it

## 2018-01-02 NOTE — Therapy (Signed)
[facility] [address] [place], [place], [address] Phone: [phone]   Fax:  [phone]  Occupational Therapy Treatment  Patient Details  Name: Tim BrackenJames M Armas MRN: [id number] Date of Birth: 01/19/1956 Referring Provider: Jenita SeashoreSamuel Kelly, MD   Encounter Date: 01/02/2018  OT End of Session - 01/02/18 1304    Visit Number  7    Number of Visits  22    Date for OT Re-Evaluation  01/15/18    Authorization Type  [employer]    Authorization Time Period  pt has been approved for additional 8 visits by 02/12/2018 (total of 16)    Authorization - Visit Number  7    Authorization - Number of Visits  16    OT Start Time  1148    OT Stop Time  1235    OT Time Calculation (min)  47 min    Activity Tolerance  Patient tolerated treatment well       History reviewed. No pertinent past medical history.  History reviewed. No pertinent surgical history.  There were no vitals filed for this visit.  Subjective Assessment - 01/02/18 1151    Subjective   Yes (when asked if he was feeling depressed)    Patient is accompained by:  Family member wife    Pertinent History  MONITOR BP!!!  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  No/denies                   OT Treatments/Exercises (OP) - 01/02/18 0001      ADLs   LB Dressing  Practiced LB dressing at wheelchair level - pt needs supervision for sitting balance when leaning forward and min a with socks only, mod vc's.  Again encouraged pt and wife to do bathing and dressing from wheelchair at kitchen sink where they would have more room..Also discussed importance of pt doing most of his own bathng and dressing for greater independence, for motor control and balance , trunk control, and to help pt  to feel he is making progress.Pt and wife both in agreement to start tomorrow.     ADL Comments  Reviewed ADL goals - wife reports that pt is still requiring assist for bathing (we tried what you showed us once) and LB dressing.  When asked how long pt is sitting in his wheelchair during the day, wife reports "hardly at all - most days he doesn't want to get up." Discussed importance of being out of the bed in order to keep from getting weaker as well as to progress forward in therapy. With pt and wife, developed an out of bed schedule for pt and wife to begin following assp. WHen asked if he was feeling depressed pt said "Yes" - when asked if that was why pt did not want to get up or do his own ADL's pt replied "yes." DIscussed that depression is very common after a stroke and is treatable.  Pt scheduled to see physiatrist July 10th and wife and pt to discuss with him at that time.      Neurological Re-education Exercises   Other Exercises 1  Neuro re ed to address sit to stand (without pulling) with emphasis on powering up with LE's and in midline for improved postural alignment  and greater muscle recruitment. Also addressed functional ambulation with hemi walker - pt needs mod facilitation to full weight RLE when preparing for stepping with LLE.  Pt with improved postural aliignment with repetition.              OT Education - 01/02/18 1301    Education provided  Yes    Education Details  reviewed ADL's again, OOB schedule, addressing depression with physiatry    Person(s) Educated  Patient;Spouse    Methods  Explanation;Demonstration    Comprehension  Verbalized understanding;Returned demonstration       OT Short Term Goals - 01/02/18 1302      OT SHORT TERM GOAL #1   Title  Pt and wife will be mod I with HEP for RUE ROM - 01/30/2018 (renewed)    Status  On-going      OT SHORT TERM GOAL #2   Title  Pt will be min a for grooming with mod cues from wife and intermittent set up prn     Status  Achieved      OT SHORT TERM GOAL #3   Title  Pt will be mod a for UB bathing at wheelchair level with mod cues    Status  Achieved      OT SHORT TERM GOAL #4   Title  Pt will be mod a for LB bathing at sit to stand level  with mod cues and set up    Status  Achieved      OT SHORT TERM GOAL #5   Title  Pt will be max a for UB dressing at wheelchair level, mod cues    Status  Achieved      OT SHORT TERM GOAL #6   Title  Pt and wife will explore options for 3 in 1 commode for pt to use in bathroom or at bedside    Status  On-going      OT SHORT TERM GOAL #7   Title  Pt will use RUE as stabilizer 25% of the time during basic self care skills with min a and cues.     Status  On-going        OT Long Term Goals - 01/02/18 1302      OT LONG TERM GOAL #1   Title  Pt and wife will be mod I with home activity program designed to increase independence in basic self care as well as address cognition - 02/27/2018 (renewed)    Status  On-going      OT LONG TERM GOAL #2   Title  Pt will be min a for UB bathing after set up with mod cues    Status  Achieved      OT LONG TERM GOAL #3   Title  Pt will be min a for UB dressing with mod cues    Status  Achieved      OT LONG TERM GOAL #4   Title  Pt will be mod a for LB bathing with mod cues    Status  On-going      OT LONG TERM GOAL #5   Title  Pt will be mod a for LB dressing with mod cues    Status  On-going      OT LONG TERM GOAL #6   Title  Pt will be min a for toilet transfers in bathroom (able to ambulate 3 steps into bathroom with wife).      Status  On-going  OT LONG TERM GOAL #7   Title  Pt will be mod a for clothing mmgt with toileting    Status  On-going      OT LONG TERM GOAL #8   Title  Pt will demonstrate ability to use RUE as stabilizer during basic self care 50% of the time with min a and cues.     Status  On-going            Plan - 01/02/18 1303    Clinical Impression Statement  Pt is  demonstrating ability to be more independent in the clinic however poor carry over at home.  See PN    Occupational Profile and client history currently impacting functional performance  HLD, HTN, s/p craniotomy, IVC filter with Coumadin, seizures.    Occupational performance deficits (Please refer to evaluation for details):  ADL's;IADL's;Rest and Sleep;Work;Leisure;Social Participation    Rehab Potential  Good    Current Impairments/barriers affecting progress:  severity of deficits    OT Frequency  2x / week    OT Duration  8 weeks    OT Treatment/Interventions  Self-care/ADL training;Moist Heat;Therapeutic exercise;Neuromuscular education;DME and/or AE instruction;Manual Therapy;Functional Mobility Training;Passive range of motion;Therapeutic activities;Splinting;Patient/family education;Balance training    Plan  check LB ADL status, NMR trunk/RUE, sit to stand, stand to sit, explore options for commode    Consulted and Agree with Plan of Care  Patient;Family member/caregiver    Family Member Consulted  [name]       Patient will benefit from skilled therapeutic intervention in order to improve the following deficits and impairments:  Abnormal gait, Decreased activity tolerance, Decreased balance, Decreased cognition, Decreased knowledge of use of DME, Decreased mobility, Decreased range of motion, Decreased safety awareness, Difficulty walking, Decreased strength, Impaired UE functional use, Impaired tone, Impaired sensation, Pain  Visit Diagnosis: Hemiplegia and hemiparesis following cerebral infarction affecting right dominant side (HCC) - Plan: Ot plan of care cert/re-cert  Abnormal posture - Plan: Ot plan of care cert/re-cert  Muscle weakness (generalized) - Plan: Ot plan of care cert/re-cert  Unsteadiness on feet - Plan: Ot plan of care cert/re-cert  Other symptoms and signs involving cognitive functions following cerebral infarction - Plan: Ot plan of care  cert/re-cert  Other symptoms and signs involving the nervous system - Plan: Ot plan of care cert/re-cert  Apraxia - Plan: Ot plan of care cert/re-cert  Other symptoms and signs involving the musculoskeletal system - Plan: Ot plan of care cert/re-cert    Problem List There are no active problems to display for this patient.   Norton Pastel, OTR/L 01/02/2018, 1:08 PM  [facility] [address] [place], [place], [address] Phone: [phone]   Fax:  [phone]  Name: YUTA CIPOLLONE MRN: [id number] Date of Birth: Feb 08, 1956

## 2018-01-02 NOTE — Therapy (Signed)
[facility] [address] [place], [place], [address] Phone: [phone]   Fax:  [phone]  Speech Language Pathology Treatment  Patient Details  Name: Tim Stephens MRN: [id number] Date of Birth: 08/06/1955 Referring Provider: Dr. Scarlette Ar   Encounter Date: 01/02/2018  End of Session - 01/02/18 1221    Visit Number  8    Number of Visits  17    Date for SLP Re-Evaluation  01/24/18    Authorization - Visit Number  7    Authorization - Number of Visits  26    SLP Start Time  1101    SLP Stop Time   1145    SLP Time Calculation (min)  44 min    Activity Tolerance  Patient tolerated treatment well       History reviewed. No pertinent past medical history.  History reviewed. No pertinent surgical history.  There were no vitals filed for this visit.  Subjective Assessment - 01/02/18 1208    Subjective  "I don't know" re: what have you been up to?    Patient is accompained by:  Family member spouse, [name]    Currently in Pain?  No/denies            ADULT SLP TREATMENT - 01/02/18 1110      General Information   Behavior/Cognition  Alert;Cooperative;Pleasant mood      Treatment Provided   Treatment provided  Cognitive-Linquistic      Cognitive-Linquistic Treatment   Treatment focused on  Aphasia;Apraxia    Skilled Treatment  Automatic speech with cloze cues, placement cues and unison 70% accurate. Written expression targeted at relevant word level (friends, car, Star Wars) with fill in the blank cue and written/verbal cues 60% accurate. Pt does indicate error awareness, however he required frequent mod A to correct errors. Verbal expression with phrase completion cues of words he wrote. Motor planning targeted with cvcv syllables and cvc words,  alternating consonants with consistent mod A, slow rate, modeling and unison speech.       Progression Toward Goals   Progression toward goals  Progressing  toward goals         SLP Short Term Goals - 01/02/18 1221      SLP SHORT TERM GOAL #1   Title  Pt will ID object f:5 to sentence description with 90% accuracy and occasional min A    Time  1    Period  Weeks    Status  Not Met      SLP SHORT TERM GOAL #2   Title  Pt will complete automatic speech tasks with 70% accuracy and visual cues over 3 sessions    Time  1    Period  Weeks    Status  Not Met      SLP SHORT TERM GOAL #3   Title  Pt will utilize multimodal communication and AAC to communicate wants and answer questions with 70% accuracy and occasional min A over 3 sessions    Time  1    Period  Weeks    Status  Not Met      SLP SHORT TERM GOAL #4   Title  Pt will write/type at word level with 80% accuracy and occasional min A over 2 sessions    Time  1    Period  Weeks    Status  Not Met       SLP Long Term Goals - 01/02/18 7672  SLP LONG TERM GOAL #1   Title  Pt will comprehend mildly complex conversation over 8 minute conversation with appropriate responses (mulitmodal) and 2 or less requests for repetition    Time  4    Period  Weeks    Status  On-going      SLP LONG TERM GOAL #2   Title  Pt will utilize multimodal communication/ACC to produce 2 word response to conversation/question 8/10x with occasional min A    Time  4    Period  Weeks    Status  On-going      SLP LONG TERM GOAL #3   Title  Pt will write 2 word phrases with 80% accuracy and occasional min A    Time  4    Period  Weeks    Status  On-going      SLP LONG TERM GOAL #4   Title  Pt will produce 1-2 syllable words in phrase completion task with 70% accuracy and occassional mod A    Time  4    Period  Weeks    Status  On-going       Plan - 01/02/18 1218    Clinical Impression Statement  Severe non fluent Broca's aphasia persists affecting all areas of language at word to phrase level. I spoke with pt and spouse re: our recommendation for speech generating device due to severity of  aphasia and verbal apraxia. I provided financial aid form from [place]. Continue skilled ST to maximize mulimodal communication for wants/needs and to reduce caregiver burden.     Speech Therapy Frequency  2x / week    Treatment/Interventions  Language facilitation;Environmental controls;Cueing hierarchy;Oral motor exercises;SLP instruction and feedback;Compensatory strategies;Functional tasks;Cognitive reorganization;Internal/external aids;Multimodal communcation approach;Patient/family education;Other (comment)    Potential Considerations  Severity of impairments       Patient will benefit from skilled therapeutic intervention in order to improve the following deficits and impairments:   Aphasia  Verbal apraxia    Problem List There are no active problems to display for this patient.   Alleyah Twombly, Annye Rusk MS, CCC-SLP 01/02/2018, 12:23 PM  [facility] [address] [place], [place], [address] Phone: [phone]   Fax:  [phone]   Name: Tim Stephens MRN: [id number] Date of Birth: 04-17-1956

## 2018-01-04 ENCOUNTER — Ambulatory Visit: Payer: BLUE CROSS/BLUE SHIELD | Admitting: Speech Pathology

## 2018-01-04 ENCOUNTER — Encounter: Payer: Self-pay | Admitting: Speech Pathology

## 2018-01-04 DIAGNOSIS — R4701 Aphasia: Secondary | ICD-10-CM

## 2018-01-04 DIAGNOSIS — R2689 Other abnormalities of gait and mobility: Secondary | ICD-10-CM | POA: Diagnosis not present

## 2018-01-04 DIAGNOSIS — R482 Apraxia: Secondary | ICD-10-CM

## 2018-01-04 NOTE — Therapy (Signed)
[facility] [address] [place], [place], [address] Phone: [phone]   Fax:  [phone]  Speech Language Pathology Treatment  Patient Details  Name: Tim Stephens MRN: [id number] Date of Birth: Jan 18, 1956 Referring Provider: Dr. Scarlette Ar   Encounter Date: 01/04/2018  End of Session - 01/04/18 1413    Visit Number  9    Number of Visits  17    Authorization Type  30 total ST visits, no auth. Per spouse HHST used 8 visits (22 remaining; 19 after eval and visit 1). Spouse agrees to leave 4 visits prior to end of year. See auth visits    Authorization - Visit Number  18    Authorization - Number of Visits  26    SLP Start Time  0962    SLP Stop Time   8366    SLP Time Calculation (min)  47 min    Activity Tolerance  Patient tolerated treatment well       History reviewed. No pertinent past medical history.  History reviewed. No pertinent surgical history.  There were no vitals filed for this visit.  Subjective Assessment - 01/04/18 1320    Subjective  Spouse returns financial form for [employer]    Patient is accompained by:  -- [name] - wife    Currently in Pain?  No/denies            ADULT SLP TREATMENT - 01/04/18 1321      General Information   Behavior/Cognition  Alert;Cooperative;Pleasant mood      Treatment Provided   Treatment provided  Cognitive-Linquistic      Cognitive-Linquistic Treatment   Treatment focused on  Aphasia;Apraxia    Skilled Treatment  Naming basic objects in the office with written and cloze cues. Pt wrote name of object with fill in the blank cues 60% accuracy. Initiated carrier phrase "I want the _______________" and filled in with word, repeated 5x, with unison, placement, and written cues 70% accuracy.       Assessment / Recommendations / Plan   Plan  Continue with current plan of care      Progression Toward Goals   Progression toward goals  Progressing toward  goals         SLP Short Term Goals - 01/04/18 1413      SLP SHORT TERM GOAL #1   Title  Pt will ID object f:5 to sentence description with 90% accuracy and occasional min A    Time  1    Period  Weeks    Status  Not Met      SLP SHORT TERM GOAL #2   Title  Pt will complete automatic speech tasks with 70% accuracy and visual cues over 3 sessions    Time  1    Period  Weeks    Status  Not Met      SLP SHORT TERM GOAL #3   Title  Pt will utilize multimodal communication and AAC to communicate wants and answer questions with 70% accuracy and occasional min A over 3 sessions    Time  1    Period  Weeks    Status  Not Met      SLP SHORT TERM GOAL #4   Title  Pt will write/type at word level with 80% accuracy and occasional min A over 2 sessions    Time  1    Period  Weeks    Status  Not Met  SLP Long Term Goals - 01/04/18 1413      SLP LONG TERM GOAL #1   Title  Pt will comprehend mildly complex conversation over 8 minute conversation with appropriate responses (mulitmodal) and 2 or less requests for repetition    Time  4    Period  Weeks    Status  On-going      SLP LONG TERM GOAL #2   Title  Pt will utilize multimodal communication/ACC to produce 2 word response to conversation/question 8/10x with occasional min A    Time  4    Period  Weeks    Status  On-going      SLP LONG TERM GOAL #3   Title  Pt will write 2 word phrases with 80% accuracy and occasional min A    Time  4    Period  Weeks    Status  On-going      SLP LONG TERM GOAL #4   Title  Pt will produce 1-2 syllable words in phrase completion task with 70% accuracy and occassional mod A    Time  4    Period  Weeks    Status  On-going       Plan - 01/04/18 1413    Clinical Impression Statement  Severe non fluent Broca's aphasia persists affecting all areas of language at word to phrase level. I spoke with pt and spouse re: our recommendation for speech generating device due to severity of aphasia  and verbal apraxia. I provided financial aid form from [place]. Continue skilled ST to maximize mulimodal communication for wants/needs and to reduce caregiver burden.     Speech Therapy Frequency  2x / week    Treatment/Interventions  Language facilitation;Environmental controls;Cueing hierarchy;Oral motor exercises;SLP instruction and feedback;Compensatory strategies;Functional tasks;Cognitive reorganization;Internal/external aids;Multimodal communcation approach;Patient/family education;Other (comment)    Potential to Achieve Goals  Fair    Potential Considerations  Severity of impairments    Consulted and Agree with Plan of Care  Patient;Family member/caregiver    Family Member Consulted  spouse, [name]       Patient will benefit from skilled therapeutic intervention in order to improve the following deficits and impairments:   Aphasia  Verbal apraxia    Problem List There are no active problems to display for this patient.   Deziree Mokry, Annye Rusk MS, CCC-SLP 01/04/2018, 2:14 PM  [facility] [address] [place], [place], [address] Phone: [phone]   Fax:  [phone]   Name: Tim Stephens MRN: [id number] Date of Birth: July 27, 1955

## 2018-01-08 ENCOUNTER — Ambulatory Visit: Payer: BLUE CROSS/BLUE SHIELD | Admitting: Occupational Therapy

## 2018-01-08 ENCOUNTER — Encounter: Payer: Self-pay | Admitting: Occupational Therapy

## 2018-01-08 ENCOUNTER — Ambulatory Visit: Payer: BLUE CROSS/BLUE SHIELD | Attending: Family Medicine | Admitting: Physical Therapy

## 2018-01-08 DIAGNOSIS — R29898 Other symptoms and signs involving the musculoskeletal system: Secondary | ICD-10-CM | POA: Insufficient documentation

## 2018-01-08 DIAGNOSIS — R4701 Aphasia: Secondary | ICD-10-CM | POA: Insufficient documentation

## 2018-01-08 DIAGNOSIS — R2681 Unsteadiness on feet: Secondary | ICD-10-CM

## 2018-01-08 DIAGNOSIS — M6281 Muscle weakness (generalized): Secondary | ICD-10-CM

## 2018-01-08 DIAGNOSIS — R29818 Other symptoms and signs involving the nervous system: Secondary | ICD-10-CM

## 2018-01-08 DIAGNOSIS — H8112 Benign paroxysmal vertigo, left ear: Secondary | ICD-10-CM | POA: Insufficient documentation

## 2018-01-08 DIAGNOSIS — R2689 Other abnormalities of gait and mobility: Secondary | ICD-10-CM | POA: Diagnosis present

## 2018-01-08 DIAGNOSIS — H8111 Benign paroxysmal vertigo, right ear: Secondary | ICD-10-CM | POA: Insufficient documentation

## 2018-01-08 DIAGNOSIS — I69318 Other symptoms and signs involving cognitive functions following cerebral infarction: Secondary | ICD-10-CM | POA: Diagnosis present

## 2018-01-08 DIAGNOSIS — R293 Abnormal posture: Secondary | ICD-10-CM

## 2018-01-08 DIAGNOSIS — I69351 Hemiplegia and hemiparesis following cerebral infarction affecting right dominant side: Secondary | ICD-10-CM | POA: Insufficient documentation

## 2018-01-08 DIAGNOSIS — R482 Apraxia: Secondary | ICD-10-CM | POA: Insufficient documentation

## 2018-01-08 DIAGNOSIS — R42 Dizziness and giddiness: Secondary | ICD-10-CM | POA: Insufficient documentation

## 2018-01-08 NOTE — Therapy (Signed)
[facility] [address] [place], [place], [address] Phone: [phone]   Fax:  [phone]  Occupational Therapy Treatment  Patient Details  Name: Tim Stephens MRN: [id number] Date of Birth: 1955-11-10 Referring Provider: Jenita Seashore, MD   Encounter Date: 01/08/2018  OT End of Session - 01/08/18 1459    Visit Number  8    Number of Visits  22 pt has been approved for 16/22 at this time    Date for OT Re-Evaluation  01/15/18    Authorization Type  [employer]    Authorization Time Period  pt has been approved for additional 8 visits by 02/12/2018 (total of 16)    Authorization - Visit Number  8    Authorization - Number of Visits  16    OT Start Time  1318    OT Stop Time  1400    OT Time Calculation (min)  42 min    Activity Tolerance  Patient tolerated treatment well       History reviewed. No pertinent past medical history.  History reviewed. No pertinent surgical history.  There were no vitals filed for this visit.  Subjective Assessment - 01/08/18 1323    Subjective   Whew ( at end of session indicating he felt a little dizzy - BP stable at 120/87 and then 116/87)    Patient is accompained by:  Family member wife    Pertinent History  MONITOR BP!!!  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  No/denies                   OT Treatments/Exercises (OP) - 01/08/18 0001      ADLs   Functional Mobility  Trial in standard wheelchair with pt using BLE's and LUE to propel chair. Pt needed mod cues to use BLE's to "walk" the chair with LUE to guide and turn.  Discussed use of clear half lap tray to assist with RUE positioning and dependent edema.      ADL Comments  [name] wife stating that pt has been following out of bed  schedule, has been very active in bathing and dressing, eating at the table now.  [name] wife did report that pt had a "bad morning yesterday" with vertigo and nausea and vomiting.  They were unable to identify cause. [name] BP today during OT session was stable at 120/87 and 116/87 however at end of session pt did indicate that he felt dizzy and wanted to lay down. Pt transitioned into supine and indicated feeling was passing.  Left in supine with wife for PT session.      Neurological Re-education Exercises   Other Exercises 1  Neuro re ed to address more active sit to stand. Addressed forward weight shift, midline orientation, increased activity for RLE, trunk and incorporation of LUE into sit to stand and active controlled stand to sit without using UE's.  Started on elevated surface and slowly reduced height of surface to chair height.  Instructed wife and wife able to return demonstrate for use of strategy for sit to stand and stand to sit at home during functional tasks and mobility.              OT Education - 01/08/18 1457    Education  provided  Yes    Education Details  sit to stand, stand to sit without UE's    Person(s) Educated  Patient;Spouse    Methods  Explanation;Demonstration    Comprehension  Verbalized understanding;Returned demonstration       OT Short Term Goals - 01/08/18 1457      OT SHORT TERM GOAL #1   Title  Pt and wife will be mod I with HEP for RUE ROM - 01/30/2018 (renewed)    Status  On-going      OT SHORT TERM GOAL #2   Title  Pt will be min a for grooming with mod cues from wife and intermittent set up prn    Status  Achieved      OT SHORT TERM GOAL #3   Title  Pt will be mod a for UB bathing at wheelchair level with mod cues    Status  Achieved      OT SHORT TERM GOAL #4   Title  Pt will be mod a for LB bathing at sit to stand level  with mod cues and set up    Status  Achieved      OT SHORT TERM GOAL #5   Title  Pt will be max a for UB dressing  at wheelchair level, mod cues    Status  Achieved      OT SHORT TERM GOAL #6   Title  Pt and wife will explore options for 3 in 1 commode for pt to use in bathroom or at bedside    Status  Achieved      OT SHORT TERM GOAL #7   Title  Pt will use RUE as stabilizer 25% of the time during basic self care skills with min a and cues.     Status  On-going        OT Long Term Goals - 01/08/18 1457      OT LONG TERM GOAL #1   Title  Pt and wife will be mod I with home activity program designed to increase independence in basic self care as well as address cognition - 02/27/2018 (renewed)    Status  On-going      OT LONG TERM GOAL #2   Title  Pt will be min a for UB bathing after set up with mod cues    Status  Achieved      OT LONG TERM GOAL #3   Title  Pt will be min a for UB dressing with mod cues    Status  Achieved      OT LONG TERM GOAL #4   Title  Pt will be mod a for LB bathing with mod cues    Status  On-going      OT LONG TERM GOAL #5   Title  Pt will be mod a for LB dressing with mod cues    Status  On-going      OT LONG TERM GOAL #6   Title  Pt will be min a for toilet transfers in bathroom (able to ambulate 3 steps into bathroom with wife).      Status  On-going      OT LONG TERM GOAL #7   Title  Pt will be mod a for clothing mmgt with toileting    Status  On-going      OT LONG TERM GOAL #8   Title  Pt will demonstrate ability to use RUE as stabilizer during basic self care 50% of  the time with min a and cues.     Status  On-going            Plan - 01/08/18 1458    Clinical Impression Statement  Pt progressing toward goals and now reporting (he and wife) that pt is following OOB schedule and more active at home.    Occupational Profile and client history currently impacting functional performance  HLD, HTN, s/p craniotomy, IVC filter with Coumadin, seizures.    Occupational performance deficits (Please refer to evaluation for details):  ADL's;IADL's;Rest  and Sleep;Work;Leisure;Social Participation    Rehab Potential  Good    Current Impairments/barriers affecting progress:  severity of deficits    OT Frequency  2x / week    OT Duration  8 weeks    Plan  NMR trunk/RUE, sit to stand, stand to sit, balance, functional mobility, possible use of Give Mohr sling for ambulation    Consulted and Agree with Plan of Care  Patient;Family member/caregiver    Family Member Consulted  [name]       Patient will benefit from skilled therapeutic intervention in order to improve the following deficits and impairments:  Abnormal gait, Decreased activity tolerance, Decreased balance, Decreased cognition, Decreased knowledge of use of DME, Decreased mobility, Decreased range of motion, Decreased safety awareness, Difficulty walking, Decreased strength, Impaired UE functional use, Impaired tone, Impaired sensation, Pain  Visit Diagnosis: Hemiplegia and hemiparesis following cerebral infarction affecting right dominant side (HCC)  Abnormal posture  Muscle weakness (generalized)  Unsteadiness on feet  Other symptoms and signs involving cognitive functions following cerebral infarction  Other symptoms and signs involving the nervous system  Apraxia  Other symptoms and signs involving the musculoskeletal system    Problem List There are no active problems to display for this patient.   Tim Stephens, Tim Stephens, OTR/L 01/08/2018, 3:02 PM  [facility] [address] [place], [place], [address] Phone: [phone]   Fax:  [phone]  Name: Tim Stephens MRN: [id number] Date of Birth: 03/03/1956

## 2018-01-09 ENCOUNTER — Encounter: Payer: Self-pay | Admitting: Physical Therapy

## 2018-01-09 ENCOUNTER — Ambulatory Visit: Payer: BLUE CROSS/BLUE SHIELD | Admitting: Physical Therapy

## 2018-01-09 ENCOUNTER — Ambulatory Visit: Payer: BLUE CROSS/BLUE SHIELD | Admitting: Occupational Therapy

## 2018-01-09 ENCOUNTER — Ambulatory Visit: Payer: BLUE CROSS/BLUE SHIELD | Admitting: Speech Pathology

## 2018-01-09 DIAGNOSIS — R482 Apraxia: Secondary | ICD-10-CM

## 2018-01-09 DIAGNOSIS — M6281 Muscle weakness (generalized): Secondary | ICD-10-CM | POA: Diagnosis not present

## 2018-01-09 DIAGNOSIS — H8112 Benign paroxysmal vertigo, left ear: Secondary | ICD-10-CM

## 2018-01-09 DIAGNOSIS — R293 Abnormal posture: Secondary | ICD-10-CM

## 2018-01-09 DIAGNOSIS — R42 Dizziness and giddiness: Secondary | ICD-10-CM

## 2018-01-09 DIAGNOSIS — R2681 Unsteadiness on feet: Secondary | ICD-10-CM

## 2018-01-09 DIAGNOSIS — R2689 Other abnormalities of gait and mobility: Secondary | ICD-10-CM

## 2018-01-09 DIAGNOSIS — R4701 Aphasia: Secondary | ICD-10-CM

## 2018-01-09 NOTE — Therapy (Signed)
[facility] [address] [place], [place], [address] Phone: [phone]   Fax:  [phone]  Speech Language Pathology Treatment  Patient Details  Name: Tim Stephens MRN: [id number] Date of Birth: June 16, 1956 Referring Provider: Dr. Scarlette Ar   Encounter Date: 01/09/2018  End of Session - 01/09/18 1605    Visit Number  10    Number of Visits  17    Date for SLP Re-Evaluation  01/24/18    Authorization - Visit Number  9    Authorization - Number of Visits  26    SLP Start Time  0109    SLP Stop Time   3235    SLP Time Calculation (min)  42 min    Activity Tolerance  Patient tolerated treatment well;Other (comment)       No past medical history on file.  No past surgical history on file.  There were no vitals filed for this visit.  Subjective Assessment - 01/09/18 1557    Subjective  [employer] will offer pt finacial aid for speech generating device (SGD)    Patient is accompained by:  Family member    Currently in Pain?  No/denies            ADULT SLP TREATMENT - 01/09/18 1558      General Information   Behavior/Cognition  Alert;Cooperative;Pleasant mood      Treatment Provided   Treatment provided  Cognitive-Linquistic      Cognitive-Linquistic Treatment   Treatment focused on  Aphasia;Apraxia    Skilled Treatment  Pt completed visual phrase completion on SGD therapy activities - level 3&4 - with extended time and rare min A. Pt verbalized 2 word phrase with placement cues, unison cues and repetition with 70% success, however perseveration, groping persist throughout. Pt utilized SGD to successfully communicate details about his lunch today with verbal cues, extended time and questioning cues.       Assessment / Recommendations / Plan   Plan  Continue with current plan of care      Progression Toward Goals   Progression toward goals  Progressing toward goals       SLP Education - 01/09/18  1600    Education provided  Yes    Education Details  practice with Talk Path app at home    Person(s) Educated  Patient;Spouse    Methods  Explanation;Demonstration    Comprehension  Verbalized understanding       SLP Short Term Goals - 01/09/18 1604      SLP SHORT TERM GOAL #1   Title  Pt will ID object f:5 to sentence description with 90% accuracy and occasional min A    Time  1    Period  Weeks    Status  Not Met      SLP SHORT TERM GOAL #2   Title  Pt will complete automatic speech tasks with 70% accuracy and visual cues over 3 sessions    Time  1    Period  Weeks    Status  Not Met      SLP SHORT TERM GOAL #3   Title  Pt will utilize multimodal communication and AAC to communicate wants and answer questions with 70% accuracy and occasional min A over 3 sessions    Time  1    Period  Weeks    Status  Not Met      SLP SHORT TERM GOAL #4   Title  Pt will write/type  at word level with 80% accuracy and occasional min A over 2 sessions    Time  1    Period  Weeks    Status  Not Met       SLP Long Term Goals - 01/09/18 1604      SLP LONG TERM GOAL #1   Title  Pt will comprehend mildly complex conversation over 8 minute conversation with appropriate responses (mulitmodal) and 2 or less requests for repetition    Baseline  01/09/18;    Time  3    Period  Weeks    Status  On-going      SLP LONG TERM GOAL #2   Title  Pt will utilize multimodal communication/ACC to produce 2 word response to conversation/question 8/10x with occasional min A    Time  3    Period  Weeks    Status  On-going      SLP LONG TERM GOAL #3   Title  Pt will write 1 word  with 70% accuracy and occasional min A    Time  4    Period  Weeks    Status  Revised      SLP LONG TERM GOAL #4   Title  Pt will produce 1-2 syllable words in phrase completion task with 70% accuracy and occassional mod A    Baseline  01/04/18    Time  3    Period  Weeks    Status  On-going       Plan - 01/09/18 1601     Clinical Impression Statement  Severe non fluent Broca's aphasia persists. Pt improving verbalizations of simple words, 1-2 word phrases with frequent mod A, which continues to non -functional for communication of wants/needs and ideas. Explained to spouse and pt that SGD will be benefit to augment pt's communciation attempts. [name] will offer some financial assisatnce. We will proceed with SGD trial;, trial device to be delivered. Continue skilled ST to maximize communication.     Speech Therapy Frequency  2x / week    Duration  -- 8 weeks or 17 total visits    Treatment/Interventions  Language facilitation;Environmental controls;Cueing hierarchy;Oral motor exercises;SLP instruction and feedback;Compensatory strategies;Functional tasks;Cognitive reorganization;Internal/external aids;Multimodal communcation approach;Patient/family education;Other (comment)    Potential to Achieve Goals  Good with SGD    Potential Considerations  Severity of impairments    Consulted and Agree with Plan of Care  Patient;Family member/caregiver    Family Member Consulted  spouse, [name]       Patient will benefit from skilled therapeutic intervention in order to improve the following deficits and impairments:   Aphasia  Verbal apraxia    Problem List There are no active problems to display for this patient.   Deantae Shackleton, Annye Rusk MS, CCC-SLP 01/09/2018, 4:06 PM  [facility] [address] [place], [place], [address] Phone: [phone]   Fax:  [phone]   Name: JATNIEL VERASTEGUI MRN: [id number] Date of Birth: 09/27/1955

## 2018-01-09 NOTE — Therapy (Signed)
[facility] [address] [place], [place], [address] Phone: [phone]   Fax:  [phone]  Physical Therapy Treatment  Patient Details  Name: Tim Stephens MRN: [id number] Date of Birth: 1955/08/29 Referring Provider: Jenita Seashore, MD   Encounter Date: 01/08/2018  PT End of Session - 01/09/18 1215    Visit Number  6    Number of Visits  33 Pt has been approved for 16 at this time    Date for PT Re-Evaluation  01/28/18    Authorization Type  [employer]- initial 8 visits approved/can ask for more    Authorization Time Period  12/13/17-02/12/18 (2nd set of 8 visits approved)    Authorization - Visit Number  6    Authorization - Number of Visits  16    PT Start Time  1415 started late due to medical issue from previous patient    PT Stop Time  1448    PT Time Calculation (min)  33 min    Equipment Utilized During Treatment  Gait belt    Activity Tolerance  Patient tolerated treatment well;Other (comment)    Behavior During Therapy  WFL for tasks assessed/performed       History reviewed. No pertinent past medical history.  History reviewed. No pertinent surgical history.  There were no vitals filed for this visit.  Subjective Assessment - 01/09/18 0754    Subjective  Wife reports (when asked by PT) they haven't been seen due to scheduling issues.  He is trying to be up out of bed more at home.  Wife reports possible episodes of vertigo brought on by sitting up in bed.    Patient is accompained by:  Family member wife    Pertinent History  Recently, pt on hold for PT due to medical issues and having skull flap surgery.  Tim Stephens is a 62 year old right hand dominant male with past medical history significant for hypertension and hyperlipidemia who was admitted to [facility] on 05/30/2017 with a aphasia and right hemibody weakness. CTA was obtained and revealed left M1 cutoff. He was taken to IR by Dr. Lionel December for cerebral  angiography with mechanical thrombectomy. The patient's acute hospital course was complicated by worsening cytotoxic edema as well areas of hemorrhagic conversion with left to right midline shift. He was taken to the OR on 06/01/2017 for decompressive left hemicraniectomy. [name] He was found to have a right peroneal, right posterior tibial, right gastrocnemius,left basilic, and left axillary venous thrombi. He then developed totally occluding superficial thrombophlebitis of the left cephalic vein and right cephalic vein. Venous thrombus then progressed in the left upper arm to the left brachial vein. The patient was not anticoagulated due to recent hemorrhagic conversion of stroke. He underwent serial scans to evaluate for progression of venous thrombosis. PEG was placed by Dr. Allayne Butcher on 06/15/2017. There was progression of right lower extremity DVT from the posterior tibial vein to the right common femoral vein. Vascular surgery was consulted and placed an inferior vena cava filter on 06/18/2017. expressive aphasia, verbal apraxia; therapy at [facility] January 2019, then [facility] rehab for 4 weeks, then [facility] therapy PT, OT, speech discharged 09/21/17    Patient Stated Goals  Per wife:  to get strong enough to get upstairs, and use the bathroom on his own; trying to be more mobile outside the home-fishing, more recreational things Pt agrees with these goals    Currently in Pain?  No/denies  Pt lying supine on mat upon PT arriving to begin PT session.  Discussed pt's recent decreased activity at home, with wife reporting he is up again in his wheelchair more in the past week.  She does report he has had dizziness in the past few days, describing when he rolled in bed and then tried to sit up.  Questioned patient regarding dizziness:  Is it unsteadiness?  No.  Is it lightheadedness?  No.  Is it room spinning?  Yes  Does it go away after sitting for a few minutes?  Yes  Began by assessing BP  in supine and sitting positions (to check BP in general and to r/o orthostatic hypotension as source of dizziness). Supine BP:  12/87 Sitting BP:  141/117 (both measures taken by automatic machine)  Returned to supine, with manual cuff measure of supine BP:  106/82 -manual cuff measure of sitting BP 98/66 Discussed orthostatic hypotension and need to make sure he is having plenty of fluids, moving slowly once in transitional positions, trying to continue to be sitting up more during the day versus lying in bed.  Discussed next session plans to perform assessment looking at vestibular system/vertigo.  For supine to sit, pt requires minimal assistance, with PT cueing patient for rolling to L, then assist to push up through RLE.  Cues and min assist for legs over side of mat.  Cues for pt to NOT hold his breath, but pt holds his breath during this activity.  Performed x 2  For sit to supine, performed sit>L sidelying>supine with min assistance, cues to NOT hold breath during activity, performed x 1.                         PT Short Term Goals - 01/09/18 1217      PT SHORT TERM GOAL #1   Title  Pt will perform HEP with wife's supervision for improved balance, strength, gait.  TARGET 12/29/17 (STGS to be fully assessed week of 01/16/18, due to pt missing several weeks of therapy)    Baseline  Ongoing, with pt/wife instructed in HEP 12/07/17 and 12/12/17 visits    Time  4    Period  Weeks    Status  On-going      PT SHORT TERM GOAL #2   Title  Pt will perform sit to stand with supervision, for improved transfer efficiency and safety with gait.    Baseline  min assist>min guard assist 12/12/17    Time  4    Period  Weeks    Status  On-going      PT SHORT TERM GOAL #3   Title  Pt will ambulate at least 100 ft using hemiwalker with minimal assistance for improved gait safety and household mobility.    Baseline  ongoing, 50 ft on 12/12/17 visit     Time  4    Period  Weeks     Status  On-going      PT SHORT TERM GOAL #4   Title  Stand pivot transfer to be assessed, with pt performing stand pivot transfer with minimal assistance, for improved participation in transfers at home.    Baseline  mod>min assist for SPT 12/12/17    Time  4    Period  Weeks    Status  On-going      PT SHORT TERM GOAL #5   Title  Pt/wife will verbalize understanding of fall prevention in home environment.  Time  4    Period  Weeks    Status  On-going      PT SHORT TERM GOAL #6   Title  Berg score to increase by at least 8 points, for improved standing balance and decreased fall risk.    Time  4    Period  Weeks    Status  New        PT Long Term Goals - 11/29/17 1119      PT LONG TERM GOAL #1   Title  Pt/wife will verbalize plans for continued community fitness upon d/c from PT.  TARGET 01/28/18    Time  8    Period  Weeks    Status  On-going    Target Date  01/28/18      PT LONG TERM GOAL #2   Title  Pt will perform sit to stand transfers modified independently, for improved safety and independence with transfers.    Time  8    Period  Weeks    Status  On-going    Target Date  01/28/18      PT LONG TERM GOAL #3   Title  Pt will ambulate at least 300 ft using least restrictive assistive device, with supervision, for improved safety and independence with gait.    Time  8    Period  Weeks    Status  Revised    Target Date  01/28/18      PT LONG TERM GOAL #4   Title  GAit velocity to be assessed, with goal to be set as appropriate.    Time  8    Period  Weeks    Status  On-going    Target Date  01/28/18      PT LONG TERM GOAL #5   Title  Pt will negotiate 12 steps using handrail, with supervision, for improved safety and independence with stair negotiation for home.    Time  8    Period  Weeks    Status  On-going    Target Date  01/28/18            Plan - 01/09/18 1222    Clinical Impression Statement  Pt has not been seen for PT since 12/12/17 (wife  reports due to scheduling issues, as he has seen OT, but not PT).  Per wife, he has had an episode of dizziness in the past several days and she is unsure if it is vertigo. Also, important to note from OT note 01/02/18, that patient has had period of not wanting to get out of bed and has just started being up again in wheelchair around home in the past week.  PT began session today by attempting to check orthostatic blood pressure prior to vestibular assessment.  However, pt's BP is fluctuating with transitions from sit<>supine, with pt noted to be holding his breath during this transitional movement.  In looking at STGs, STG 1, 2, 3, 4 are ongoing, pt had been progressing towards them prior to this session, STG 5 has not yet been addressed for fall prevention.  Pt will benefit from vestibular assessment to assess for positional vertigo, with progression with functional mobility activities as pt is able to tolerate.    Rehab Potential  Good    Clinical Impairments Affecting Rehab Potential  Good family support; severity of deficits    PT Frequency  2x / week    PT Duration  8 weeks    PT Treatment/Interventions  ADLs/Self Care Home Management;DME Instruction;Gait training;Stair training;Functional mobility training;Therapeutic activities;Therapeutic exercise;Balance training;Orthotic Fit/Training;Patient/family education;Neuromuscular re-education    PT Next Visit Plan  Check BP; vestibular assessment for positional vertigo.  Next week:  formally assess STGs; work on standing quad/glut strengthening on RLE in weightbearing; gait training with foot-up versus AFO trial.    Consulted and Agree with Plan of Care  Patient;Family member/caregiver    Family Member Consulted  wife       Patient will benefit from skilled therapeutic intervention in order to improve the following deficits and impairments:  Abnormal gait, Decreased activity tolerance, Decreased balance, Decreased knowledge of precautions, Decreased  endurance, Decreased knowledge of use of DME, Decreased mobility, Decreased safety awareness, Difficulty walking, Decreased strength, Impaired tone, Postural dysfunction  Visit Diagnosis: Muscle weakness (generalized)  Other symptoms and signs involving the nervous system     Problem List There are no active problems to display for this patient.   Ethelwyn Gilbertson W. 01/09/2018, 12:31 PM  Gean Maidens., PT    [facility] [address] [place], [place], [address] Phone: [phone]   Fax:  [phone]  Name: Tim Stephens MRN: [id number] Date of Birth: [birth year]

## 2018-01-09 NOTE — Therapy (Signed)
[facility] [address] [place], [place], [address] Phone: [phone]   Fax:  [phone]  Physical Therapy Treatment  Patient Details  Name: Tim Stephens MRN: [id number] Date of Birth: May 09, 1956 Referring Provider: Jenita Seashore, MD   Encounter Date: 01/09/2018  PT End of Session - 01/09/18 1453    Visit Number  7    Number of Visits  33 Pt has been approved for 16 at this time    Date for PT Re-Evaluation  01/28/18    Authorization Type  [employer]- initial 8 visits approved/can ask for more    Authorization Time Period  12/13/17-02/12/18 (2nd set of 8 visits approved)    Authorization - Visit Number  7    Authorization - Number of Visits  16    PT Start Time  1405    PT Stop Time  1445    PT Time Calculation (min)  40 min    Activity Tolerance  Treatment limited secondary to medical complications (Comment) dizziness and vomiting    Behavior During Therapy  [facility] for tasks assessed/performed       History reviewed. No pertinent past medical history.  History reviewed. No pertinent surgical history.  There were no vitals filed for this visit.  Subjective Assessment - 01/09/18 1405    Subjective  Gave pt more fluids today and wife reports his color is better, pt reports he feels better and has not had any sensation of dizziness or lightheadedness.    Pertinent History  Recently, pt on hold for PT due to medical issues and having skull flap surgery.  Tim Stephens is a 62 year old right hand dominant male with past medical history significant for hypertension and hyperlipidemia who was admitted to [facility] on 05/30/2017 with a aphasia and right hemibody weakness. CTA was obtained and revealed left M1 cutoff. He was taken to IR by Dr. Lionel December for cerebral angiography with mechanical thrombectomy. The patient's acute hospital course was complicated by worsening cytotoxic edema as well areas of hemorrhagic conversion with left to right  midline shift. He was taken to the OR on 06/01/2017 for decompressive left hemicraniectomy. [name] He was found to have a right peroneal, right posterior tibial, right gastrocnemius,left basilic, and left axillary venous thrombi. He then developed totally occluding superficial thrombophlebitis of the left cephalic vein and right cephalic vein. Venous thrombus then progressed in the left upper arm to the left brachial vein. The patient was not anticoagulated due to recent hemorrhagic conversion of stroke. He underwent serial scans to evaluate for progression of venous thrombosis. PEG was placed by Dr. Allayne Butcher on 06/15/2017. There was progression of right lower extremity DVT from the posterior tibial vein to the right common femoral vein. Vascular surgery was consulted and placed an inferior vena cava filter on 06/18/2017. expressive aphasia, verbal apraxia; therapy at [facility] January 2019, then [facility] rehab for 4 weeks, then [facility] therapy PT, OT, speech discharged 09/21/17    Patient Stated Goals  Per wife:  to get strong enough to get upstairs, and use the bathroom on his own; trying to be more mobile outside the home-fishing, more recreational things    Currently in Pain?  No/denies             Vestibular Assessment - 01/09/18 1405      Vestibular Assessment   General Observation  has a history of vertigo in May 2017, with vomiting and out of work for a week.  Did the Epley  during that time and it helped.  Reports this episode of dizziness feels the same      Symptom Behavior   Type of Dizziness  Spinning    Frequency of Dizziness  daily    Duration of Dizziness  around a minute    Aggravating Factors  Rolling to right;Rolling to left;Supine to sit    Relieving Factors  Head stationary      Occulomotor Exam   Occulomotor Alignment  Normal    Spontaneous  Absent    Gaze-induced  Absent    Smooth Pursuits  Saccades in R eye    Saccades  Slow    Comment  convergence impaired       Vestibulo-Occular Reflex   VOR to Slow Head Movement  Positive bilaterally    VOR Cancellation  Corrective saccades    Comment  HIT: negative bilaterally      Positional Testing   Dix-Hallpike  Dix-Hallpike Left;Dix-Hallpike Right    Horizontal Canal Testing  Horizontal Canal Right;Horizontal Canal Left      Dix-Hallpike Right   Dix-Hallpike Right Duration  unable to test as pt began to vomit      Dix-Hallpike Left   Dix-Hallpike Left Duration  20 seconds    Dix-Hallpike Left Symptoms  Upbeat, left rotatory nystagmus      Horizontal Canal Right   Horizontal Canal Right Duration  15 seconds    Horizontal Canal Right Symptoms  Nystagmus R rotary      Horizontal Canal Left   Horizontal Canal Left Duration  5 seconds    Horizontal Canal Left Symptoms  Other (comment) no nystagmus               [facility] Adult PT Treatment/Exercise - 01/09/18 1502      Transfers   Transfers  Sit to Stand;Stand to [employer] Transfers    Sit to Stand  5: Supervision    Stand to Sit  4: Min guard    Stand Pivot Transfers  4: Min Garment/textile technologist Details (indicate cue type and reason)  w/c > mat with cues to attend to RUE when sitting              PT Education - 01/09/18 1451    Education provided  Yes    Education Details  L BPPV, will not treat today due to vomiting - will add one visit for vestibular treatment.  Try to sleep with head more upright until can treat on Monday    Person(s) Educated  Patient;Spouse    Methods  Explanation    Comprehension  Verbalized understanding       PT Short Term Goals - 01/09/18 1217      PT SHORT TERM GOAL #1   Title  Pt will perform HEP with wife's supervision for improved balance, strength, gait.  TARGET 12/29/17 (STGS to be fully assessed week of 01/16/18, due to pt missing several weeks of therapy)    Baseline  Ongoing, with pt/wife instructed in HEP 12/07/17 and 12/12/17 visits    Time  4    Period  Weeks    Status   On-going      PT SHORT TERM GOAL #2   Title  Pt will perform sit to stand with supervision, for improved transfer efficiency and safety with gait.    Baseline  min assist>min guard assist 12/12/17    Time  4    Period  Weeks    Status  On-going      PT SHORT TERM GOAL #3   Title  Pt will ambulate at least 100 ft using hemiwalker with minimal assistance for improved gait safety and household mobility.    Baseline  ongoing, 50 ft on 12/12/17 visit     Time  4    Period  Weeks    Status  On-going      PT SHORT TERM GOAL #4   Title  Stand pivot transfer to be assessed, with pt performing stand pivot transfer with minimal assistance, for improved participation in transfers at home.    Baseline  mod>min assist for SPT 12/12/17    Time  4    Period  Weeks    Status  On-going      PT SHORT TERM GOAL #5   Title  Pt/wife will verbalize understanding of fall prevention in home environment.    Time  4    Period  Weeks    Status  On-going      PT SHORT TERM GOAL #6   Title  Berg score to increase by at least 8 points, for improved standing balance and decreased fall risk.    Time  4    Period  Weeks    Status  New        PT Long Term Goals - 11/29/17 1119      PT LONG TERM GOAL #1   Title  Pt/wife will verbalize plans for continued community fitness upon d/c from PT.  TARGET 01/28/18    Time  8    Period  Weeks    Status  On-going    Target Date  01/28/18      PT LONG TERM GOAL #2   Title  Pt will perform sit to stand transfers modified independently, for improved safety and independence with transfers.    Time  8    Period  Weeks    Status  On-going    Target Date  01/28/18      PT LONG TERM GOAL #3   Title  Pt will ambulate at least 300 ft using least restrictive assistive device, with supervision, for improved safety and independence with gait.    Time  8    Period  Weeks    Status  Revised    Target Date  01/28/18      PT LONG TERM GOAL #4   Title  GAit velocity to be  assessed, with goal to be set as appropriate.    Time  8    Period  Weeks    Status  On-going    Target Date  01/28/18      PT LONG TERM GOAL #5   Title  Pt will negotiate 12 steps using handrail, with supervision, for improved safety and independence with stair negotiation for home.    Time  8    Period  Weeks    Status  On-going    Target Date  01/28/18            Plan - 01/09/18 1459    Clinical Impression Statement  Treatment session today focused on vestibular assessment due to new onset of vertigo present with rolling and supine <> sit.  Pt presents with impaired oculomotor exam but intact VOR.  During positional testing pt presented with pure R rotary nystagmus rolling to R, mild vertigo rolling to L and then in L hallpike-dix pt experienced severe vertigo and unable to open eyes until very end  of episode; briefly observed upbeating, L rotary nystagmus.  Allowed pt to rest in supine but pt began to feel nauseous and had to sit up quickly due to significant amount of emesis.  Following emesis pt given water and washcloth.  Pt did not experience any further emesis but therapist did not proceed with treatment of L posterior canal BPPV.  Set up extra visit next week to treat.  Pt and wife agreeable.     Rehab Potential  Good    Clinical Impairments Affecting Rehab Potential  Good family support; severity of deficits    PT Frequency  Other (comment) 2x/week but week of 7/8 3 visits to treat BPPV    PT Duration  8 weeks    PT Treatment/Interventions  ADLs/Self Care Home Management;DME Instruction;Gait training;Stair training;Functional mobility training;Therapeutic activities;Therapeutic exercise;Balance training;Orthotic Fit/Training;Patient/family education;Neuromuscular re-education;Canalith Repostioning;Vestibular    PT Next Visit Plan  Check BP; Treat L BPPV; smooth pursuit exercises; Next week:  formally assess STGs; work on standing quad/glut strengthening on RLE in weightbearing;  gait training with foot-up versus AFO trial.    Consulted and Agree with Plan of Care  Patient;Family member/caregiver    Family Member Consulted  wife       Patient will benefit from skilled therapeutic intervention in order to improve the following deficits and impairments:  Abnormal gait, Decreased activity tolerance, Decreased balance, Decreased knowledge of precautions, Decreased endurance, Decreased knowledge of use of DME, Decreased mobility, Decreased safety awareness, Difficulty walking, Decreased strength, Impaired tone, Postural dysfunction, Dizziness  Visit Diagnosis: BPPV (benign paroxysmal positional vertigo), left  Dizziness and giddiness  Abnormal posture  Muscle weakness (generalized)  Unsteadiness on feet  Other abnormalities of gait and mobility     Problem List There are no active problems to display for this patient.   Dierdre Highman, PT, DPT 01/09/18    3:10 PM    [facility] [address] [place], [place], [address] Phone: [phone]   Fax:  [phone]  Name: Tim Stephens MRN: [id number] Date of Birth: Mar 06, 1956

## 2018-01-12 ENCOUNTER — Ambulatory Visit: Payer: BLUE CROSS/BLUE SHIELD

## 2018-01-12 DIAGNOSIS — R4701 Aphasia: Secondary | ICD-10-CM

## 2018-01-12 DIAGNOSIS — M6281 Muscle weakness (generalized): Secondary | ICD-10-CM | POA: Diagnosis not present

## 2018-01-12 DIAGNOSIS — R482 Apraxia: Secondary | ICD-10-CM

## 2018-01-12 NOTE — Patient Instructions (Addendum)
Practice your name about 50 times each day.  After your name is mastered, then copy your address (number and street name) and master that  Continue to work on talking at home, and start listening to and singing to music!

## 2018-01-12 NOTE — Therapy (Signed)
[facility] [address] [place], [place], [address] Phone: [phone]   Fax:  [phone]  Speech Language Pathology Treatment  Patient Details  Name: ZIAN DELAIR MRN: [id number] Date of Birth: [birth year] Referring Provider: Dr. Scarlette Ar   Encounter Date: 01/12/2018  End of Session - 01/12/18 1536    Visit Number  11    Number of Visits  17    Date for SLP Re-Evaluation  01/24/18    Authorization Type  30 total ST visits, no auth. Per spouse HHST used 8 visits (22 remaining; 19 after eval and visit 1). Spouse agrees to leave 4 visits prior to end of year. See auth visits    Authorization - Visit Number  20    Authorization - Number of Visits  26    SLP Start Time  3716    SLP Stop Time   1401    SLP Time Calculation (min)  43 min    Activity Tolerance  Patient tolerated treatment well       History reviewed. No pertinent past medical history.  History reviewed. No pertinent surgical history.  There were no vitals filed for this visit.  Subjective Assessment - 01/12/18 1336    Patient is accompained by:  Family member wife    Currently in Pain?  No/denies            ADULT SLP TREATMENT - 01/12/18 1337      General Information   Behavior/Cognition  Alert;Cooperative;Pleasant mood      Treatment Provided   Treatment provided  Cognitive-Linquistic      Cognitive-Linquistic Treatment   Treatment focused on  Aphasia;Apraxia    Skilled Treatment  SLP worked with pt on rote speech with nursery rhymes and pt with mod a generated a functional equivalent for final word of a line 75% of the time. Pt copied his daughter's name with extra time and min A from SLP, wrote his name with consistent extra time, and rare min A from SLP faded to independent with extra time. Encouraged pt/wife to have pt practice writing his own name 30-50 times a day and then he can sign in to therapy. Pt then stated appoximation of  "thank you' to SLP. Pt wrote his wife's name "[name]" with extra time and SBA faded to independence. SLP told pt and wife to practice his address when his name is automatically written. Told pt/wife to play some well-liked songs at home and encouraged pt to sing along - explained right brain/left brain interaction in rehab recovery.      Assessment / Recommendations / Plan   Plan  Continue with current plan of care       SLP Education - 01/12/18 1536    Education provided  Yes    Education Details  list of nursery rhymes, play music and try to sing along    Person(s) Educated  Patient;Spouse    Methods  Handout;Demonstration;Explanation    Comprehension  Verbalized understanding;Returned demonstration       SLP Short Term Goals - 01/09/18 1604      SLP SHORT TERM GOAL #1   Title  Pt will ID object f:5 to sentence description with 90% accuracy and occasional min A    Time  1    Period  Weeks    Status  Not Met      SLP SHORT TERM GOAL #2   Title  Pt will complete automatic speech tasks with 70% accuracy  and visual cues over 3 sessions    Time  1    Period  Weeks    Status  Not Met      SLP SHORT TERM GOAL #3   Title  Pt will utilize multimodal communication and AAC to communicate wants and answer questions with 70% accuracy and occasional min A over 3 sessions    Time  1    Period  Weeks    Status  Not Met      SLP SHORT TERM GOAL #4   Title  Pt will write/type at word level with 80% accuracy and occasional min A over 2 sessions    Time  1    Period  Weeks    Status  Not Met       SLP Long Term Goals - 01/12/18 1539      SLP LONG TERM GOAL #1   Title  Pt will comprehend mildly complex conversation over 8 minute conversation with appropriate responses (mulitmodal) and 2 or less requests for repetition    Baseline  01/09/18    Status  Achieved      SLP LONG TERM GOAL #2   Title  Pt will utilize multimodal communication/ACC to produce 2 word response to  conversation/question 8/10x with occasional min A    Time  3    Period  Weeks    Status  On-going      SLP LONG TERM GOAL #3   Title  Pt will write 1 word  with 70% accuracy and occasional min A    Time  4    Period  Weeks    Status  Revised      SLP LONG TERM GOAL #4   Title  Pt will produce 1-2 syllable words in phrase completion task with 70% accuracy and occassional mod A    Baseline  01/04/18    Time  3    Period  Weeks    Status  On-going       Plan - 01/12/18 1538    Clinical Impression Statement  Severe non fluent Broca's aphasia persists. Pt improving verbalizations of simple words, 1-2 word phrases with frequent mod A, which continues to non -functional for communication of wants/needs and ideas. See "skiled intervention" for more details. Continue skilled ST to maximize communication.     Speech Therapy Frequency  2x / week    Duration  -- 8 weeks or 17 total visits    Treatment/Interventions  Language facilitation;Environmental controls;Cueing hierarchy;Oral motor exercises;SLP instruction and feedback;Compensatory strategies;Functional tasks;Cognitive reorganization;Internal/external aids;Multimodal communcation approach;Patient/family education;Other (comment)    Potential to Achieve Goals  Good with SGD    Potential Considerations  Severity of impairments    Consulted and Agree with Plan of Care  Patient;Family member/caregiver    Family Member Consulted  spouse, [name]       Patient will benefit from skilled therapeutic intervention in order to improve the following deficits and impairments:   Aphasia  Verbal apraxia    Problem List There are no active problems to display for this patient.   [facility] ,[place], [place]  01/12/2018, 3:40 PM  [facility] [address] [place], [place], [address] Phone: [phone]   Fax:  [phone]   Name: JAH ALARID MRN: [id number] Date of Birth:  01-30-1956

## 2018-01-15 ENCOUNTER — Encounter: Payer: Self-pay | Admitting: Physical Therapy

## 2018-01-15 ENCOUNTER — Ambulatory Visit: Payer: BLUE CROSS/BLUE SHIELD | Admitting: Physical Therapy

## 2018-01-15 VITALS — BP 108/84 | HR 88

## 2018-01-15 DIAGNOSIS — H8111 Benign paroxysmal vertigo, right ear: Secondary | ICD-10-CM

## 2018-01-15 DIAGNOSIS — R42 Dizziness and giddiness: Secondary | ICD-10-CM

## 2018-01-15 DIAGNOSIS — M6281 Muscle weakness (generalized): Secondary | ICD-10-CM | POA: Diagnosis not present

## 2018-01-16 ENCOUNTER — Ambulatory Visit: Payer: BLUE CROSS/BLUE SHIELD | Admitting: Physical Therapy

## 2018-01-16 ENCOUNTER — Ambulatory Visit: Payer: BLUE CROSS/BLUE SHIELD | Admitting: Speech Pathology

## 2018-01-16 ENCOUNTER — Ambulatory Visit: Payer: BLUE CROSS/BLUE SHIELD | Admitting: Occupational Therapy

## 2018-01-16 DIAGNOSIS — M6281 Muscle weakness (generalized): Secondary | ICD-10-CM

## 2018-01-16 DIAGNOSIS — R2681 Unsteadiness on feet: Secondary | ICD-10-CM

## 2018-01-16 DIAGNOSIS — R2689 Other abnormalities of gait and mobility: Secondary | ICD-10-CM

## 2018-01-16 NOTE — Therapy (Signed)
[facility] [address] [place], [place], [address] Phone: [phone]   Fax:  [phone]  Physical Therapy Treatment  Patient Details  Name: Tim Stephens MRN: [id number] Date of Birth: [birth year] Referring Provider: Jenita Seashore, MD   Encounter Date: 01/15/2018  PT End of Session - 01/16/18 1443    Visit Number  8    Number of Visits  33 Pt has been approved for 16 at this time    Date for PT Re-Evaluation  01/28/18    Authorization Type  [employer]- initial 8 visits approved/can ask for more    Authorization Time Period  12/13/17-02/12/18 (2nd set of 8 visits approved)    Authorization - Visit Number  8    Authorization - Number of Visits  16    PT Start Time  0935    PT Stop Time  1010    PT Time Calculation (min)  35 min    Activity Tolerance  Patient tolerated treatment well dizziness and vomiting    Behavior During Therapy  [facility] for tasks assessed/performed       History reviewed. No pertinent past medical history.  History reviewed. No pertinent surgical history.  Vitals:   01/15/18 0943  BP: 108/84  Pulse: 88    Subjective Assessment - 01/15/18 0940    Subjective  After assessment on Wednesday pt had to go home and take a nap. still felt bad the next day but then improved through the weekend.  Still having some syptoms when rolling for hygiene per wife.    Pertinent History  Recently, pt on hold for PT due to medical issues and having skull flap surgery.  Tim Stephens is a 62 year old right hand dominant male with past medical history significant for hypertension and hyperlipidemia who was admitted to [facility] on 05/30/2017 with a aphasia and right hemibody weakness. CTA was obtained and revealed left M1 cutoff. He was taken to IR by Dr. Lionel December for cerebral angiography with mechanical thrombectomy. The patient's acute hospital course was complicated by worsening cytotoxic edema as well areas of hemorrhagic conversion  with left to right midline shift. He was taken to the OR on 06/01/2017 for decompressive left hemicraniectomy. [name] He was found to have a right peroneal, right posterior tibial, right gastrocnemius,left basilic, and left axillary venous thrombi. He then developed totally occluding superficial thrombophlebitis of the left cephalic vein and right cephalic vein. Venous thrombus then progressed in the left upper arm to the left brachial vein. The patient was not anticoagulated due to recent hemorrhagic conversion of stroke. He underwent serial scans to evaluate for progression of venous thrombosis. PEG was placed by Dr. Allayne Butcher on 06/15/2017. There was progression of right lower extremity DVT from the posterior tibial vein to the right common femoral vein. Vascular surgery was consulted and placed an inferior vena cava filter on 06/18/2017. expressive aphasia, verbal apraxia; therapy at [facility] January 2019, then [facility] rehab for 4 weeks, then [facility] therapy PT, OT, speech discharged 09/21/17    Patient Stated Goals  Per wife:  to get strong enough to get upstairs, and use the bathroom on his own; trying to be more mobile outside the home-fishing, more recreational things    Currently in Pain?  No/denies             Vestibular Assessment - 01/15/18 0944      Positional Testing   Dix-Hallpike  Dix-Hallpike Left      Dix-Hallpike Right  Dix-Hallpike Right Duration  5    Dix-Hallpike Right Symptoms  Upbeat, right rotatory nystagmus      Dix-Hallpike Left   Dix-Hallpike Left Duration  0    Dix-Hallpike Left Symptoms  No nystagmus               [facility] Adult PT Treatment/Exercise - 01/16/18 1441      Transfers   Transfers  Sit to Stand;Stand to Dollar GeneralSit;Stand Pivot Transfers    Sit to Stand  4: Min assist;With upper extremity assist    Stand Pivot Transfers  4: Min assist    Stand Pivot Transfer Details (indicate cue type and reason)  w/c <> mat to L and R with HHA to stabilize       Vestibular Treatment/Exercise - 01/16/18 1440      Vestibular Treatment/Exercise   Vestibular Treatment Provided  Canalith Repositioning    Canalith Repositioning  Epley Manuever Right       EPLEY MANUEVER RIGHT   Number of Reps   1    Overall Response  Symptoms Resolved    Response Details   with retest of R posterior canal pt denied symptoms of vertigo and no nystagmus observed.  MIld dizziness reported when returning to upright position            PT Education - 01/16/18 1442    Education provided  Yes    Education Details  multi canal BPPV; incidence of reoccurence    Person(s) Educated  Patient;Spouse    Methods  Explanation    Comprehension  Verbalized understanding       PT Short Term Goals - 01/09/18 1518      PT SHORT TERM GOAL #1   Title  Pt will perform HEP with wife's supervision for improved balance, strength, gait.  TARGET 12/29/17 (STGS to be fully assessed week of 01/16/18, due to pt missing several weeks of therapy)    Baseline  Ongoing, with pt/wife instructed in HEP 12/07/17 and 12/12/17 visits    Time  4    Period  Weeks    Status  On-going    Target Date  12/29/17      PT SHORT TERM GOAL #2   Title  Pt will perform sit to stand with supervision, for improved transfer efficiency and safety with gait.    Baseline  min assist>min guard assist 12/12/17    Time  4    Period  Weeks    Status  On-going      PT SHORT TERM GOAL #3   Title  Pt will ambulate at least 100 ft using hemiwalker with minimal assistance for improved gait safety and household mobility.    Baseline  ongoing, 50 ft on 12/12/17 visit     Time  4    Period  Weeks    Status  On-going      PT SHORT TERM GOAL #4   Title  Stand pivot transfer to be assessed, with pt performing stand pivot transfer with minimal assistance, for improved participation in transfers at home.    Baseline  mod>min assist for SPT 12/12/17    Time  4    Period  Weeks    Status  On-going      PT SHORT TERM GOAL  #5   Title  Pt/wife will verbalize understanding of fall prevention in home environment.    Time  4    Period  Weeks    Status  On-going  Additional Short Term Goals   Additional Short Term Goals  Yes      PT SHORT TERM GOAL #6   Title  Berg score to increase by at least 8 points, for improved standing balance and decreased fall risk.    Time  4    Period  Weeks    Status  New      PT SHORT TERM GOAL #7   Title  Pt will tolerate treatment of L BPPV with canalith repositioning maneuver    Status  New    Target Date  12/29/17        PT Long Term Goals - 01/09/18 1517      PT LONG TERM GOAL #1   Title  Pt/wife will verbalize plans for continued community fitness upon d/c from PT.  TARGET 01/28/18    Time  8    Period  Weeks    Status  On-going      PT LONG TERM GOAL #2   Title  Pt will perform sit to stand transfers modified independently, for improved safety and independence with transfers.    Time  8    Period  Weeks    Status  On-going      PT LONG TERM GOAL #3   Title  Pt will ambulate at least 300 ft using least restrictive assistive device, with supervision, for improved safety and independence with gait.    Time  8    Period  Weeks    Status  Revised      PT LONG TERM GOAL #4   Title  GAit velocity to be assessed, with goal to be set as appropriate.    Time  8    Period  Weeks    Status  On-going      PT LONG TERM GOAL #5   Title  Pt will negotiate 12 steps using handrail, with supervision, for improved safety and independence with stair negotiation for home.    Time  8    Period  Weeks    Status  On-going      Additional Long Term Goals   Additional Long Term Goals  Yes      PT LONG TERM GOAL #6   Title  Pt will report 0/10 spinning/dizziness with rolling in bed and supine <> sit due to resolution of L BPPV    Status  New    Target Date  01/28/18            Plan - 01/16/18 1444    Clinical Impression Statement  Continued assessment of  peripheral vestibular system with no reports of vertigo and no nystagmus noted today when assessing L posterior canal indicating resolution of L BPPV observed last session.  Proceeded with testing of R vestibular system with pt demonstrating nystagmus and subjective symptoms of vertigo.  Performed 1 CRM on the right side with resolution of symptoms.  Discussed incidence of reoccurence.  Will continue to assess and treat as indicated in order to continue to progress towards LTG.    Rehab Potential  Good    Clinical Impairments Affecting Rehab Potential  Good family support; severity of deficits    PT Frequency  Other (comment) 2x/week but week of 7/8 3 visits to treat BPPV    PT Duration  8 weeks    PT Treatment/Interventions  ADLs/Self Care Home Management;DME Instruction;Gait training;Stair training;Functional mobility training;Therapeutic activities;Therapeutic exercise;Balance training;Orthotic Fit/Training;Patient/family education;Neuromuscular re-education;Canalith Repostioning;Vestibular    PT Next Visit Plan  Check BP; treat BPPV as indicated; formally assess STGs; work on standing quad/glut strengthening on RLE in weightbearing; gait training with foot-up versus AFO trial.    Consulted and Agree with Plan of Care  Patient;Family member/caregiver    Family Member Consulted  wife       Patient will benefit from skilled therapeutic intervention in order to improve the following deficits and impairments:  Abnormal gait, Decreased activity tolerance, Decreased balance, Decreased knowledge of precautions, Decreased endurance, Decreased knowledge of use of DME, Decreased mobility, Decreased safety awareness, Difficulty walking, Decreased strength, Impaired tone, Postural dysfunction, Dizziness  Visit Diagnosis: BPPV (benign paroxysmal positional vertigo), right  Dizziness and giddiness     Problem List There are no active problems to display for this patient.   Dierdre Highman, PT,  DPT 01/16/18    2:49 PM    [facility] [address] [place], [place], [address] Phone: [phone]   Fax:  [phone]  Name: Tim Stephens MRN: [id number] Date of Birth: September 03, 1955

## 2018-01-17 ENCOUNTER — Encounter: Payer: Self-pay | Admitting: Physical Therapy

## 2018-01-17 NOTE — Therapy (Signed)
[facility] [address] [place], [place], [address] Phone: [phone]   Fax:  [phone]  Physical Therapy Treatment  Patient Details  Name: YUE GLASHEEN MRN: [id number] Date of Birth: August 09, 1955 Referring Provider: Jenita Seashore, MD   Encounter Date: 01/16/2018  PT End of Session - 01/17/18 1215    Visit Number  9    Number of Visits  33 Pt has been approved for 16 at this time    Date for PT Re-Evaluation  01/28/18    Authorization Type  [employer]- initial 8 visits approved/can ask for more    Authorization Time Period  12/13/17-02/12/18 (2nd set of 8 visits approved)    Authorization - Visit Number  9    Authorization - Number of Visits  16    PT Start Time  1315    PT Stop Time  1405    PT Time Calculation (min)  50 min    Activity Tolerance  Patient tolerated treatment well;Patient limited by fatigue;Treatment limited secondary to medical complications (Comment) low blood pressure upon standing    Behavior During Therapy  [facility] for tasks assessed/performed       History reviewed. No pertinent past medical history.  History reviewed. No pertinent surgical history.  There were no vitals filed for this visit.  Subjective Assessment - 01/17/18 1204    Subjective  Per patient and wife, no more vertigo symptoms since yesterday.      Patient is accompained by:  Family member wife    Pertinent History  Recently, pt on hold for PT due to medical issues and having skull flap surgery.  Veldon Wager is a 62 year old right hand dominant male with past medical history significant for hypertension and hyperlipidemia who was admitted to [facility] on 05/30/2017 with a aphasia and right hemibody weakness. CTA was obtained and revealed left M1 cutoff. He was taken to IR by Dr. Lionel December for cerebral angiography with mechanical thrombectomy. The patient's acute hospital course was complicated by worsening cytotoxic edema as well areas of  hemorrhagic conversion with left to right midline shift. He was taken to the OR on 06/01/2017 for decompressive left hemicraniectomy. [name] He was found to have a right peroneal, right posterior tibial, right gastrocnemius,left basilic, and left axillary venous thrombi. He then developed totally occluding superficial thrombophlebitis of the left cephalic vein and right cephalic vein. Venous thrombus then progressed in the left upper arm to the left brachial vein. The patient was not anticoagulated due to recent hemorrhagic conversion of stroke. He underwent serial scans to evaluate for progression of venous thrombosis. PEG was placed by Dr. Allayne Butcher on 06/15/2017. There was progression of right lower extremity DVT from the posterior tibial vein to the right common femoral vein. Vascular surgery was consulted and placed an inferior vena cava filter on 06/18/2017. expressive aphasia, verbal apraxia; therapy at [facility] January 2019, then [facility] rehab for 4 weeks, then [facility] therapy PT, OT, speech discharged 09/21/17    Patient Stated Goals  Per wife:  to get strong enough to get upstairs, and use the bathroom on his own; trying to be more mobile outside the home-fishing, more recreational things    Currently in Pain?  No/denies                       [facility] Adult PT Treatment/Exercise - 01/17/18 0001      Transfers   Transfers  Sit to Stand;Stand to [employer]  Sit to Stand  4: Min assist;With upper extremity assist    Stand to Sit  4: Min assist;With upper extremity assist      Ambulation/Gait   Ambulation/Gait  Yes    Ambulation/Gait Assistance  4: Min assist    Ambulation Distance (Feet)  63 Feet then 40; 8 ft    Assistive device  Hemi-walker foot-up brace    Gait Pattern  Step-to pattern;Decreased step length - right;Decreased stance time - right;Decreased dorsiflexion - right;Right genu recurvatum;Lateral trunk lean to left;Poor foot clearance - right     Ambulation Surface  Level;Indoor    Pre-Gait Activities  Standing at [employer] prior to gait with cues for upright posture, then cues for lateral weightshifting prior to initiating gait.  Seated rest break after first bout of gait to don foot-up brace.  Seated break after second bout of gait.  Standing at counter x 2 minutes, with bilateral UE support, with cues for lateral weightshifting, for equal weightbearing through bilateral lower extremities.    Gait Comments  With gait without foot-up brace, pt noted to have decreased R ankle dorsiflexion, decreased R foot clearance; with foot-up brace, pt noted to have improved (though not fully consistent) foot clearance and heelstrike on RLE, but pt noted to have R knee recurvatum.      Therapeutic Activities (cont):  Upon pt's drop in blood pressure and him requesting to sit; attempted sit to stand to replace regular chair with wheelchair, and pt unable.  Performed +2 total assist stand pivot transfer chair>wheelchair and total assist +2 to reposition.     Self Care: Upon returning to sit in chair and positioning feet in elevated position, w/c in reclined position, initial BP is 70/42.  After 8 minutes, BP checked and is 88/56,  After additional 10 minutes, BP checked and 92/66.  Pt reports feeling better, but tired and requested to wife to not proceed with any of his other therapies today.  Discussed with wife the need to discuss his significant blood pressure fluctuations with MD (they have visit on 01/17/18) and numbers from this session and several sessions ago provided to ask about orthostatic hypotension.  In addition, discussed that our options are to work on sitting and standing tolerance at home and in therapy sessions, with slowed transitions between positions, trying lower extremity exercises in sitting prior to standing (though we have been unable to do this because of pt's tendency to hold his breath), and making sure patient is drinking plenty  of fluids.  [name] wife plans to talk to MD at visit tomorrow.      PT Short Term Goals - 01/09/18 1518      PT SHORT TERM GOAL #1   Title  Pt will perform HEP with wife's supervision for improved balance, strength, gait.  TARGET 12/29/17 (STGS to be fully assessed week of 01/16/18, due to pt missing several weeks of therapy)    Baseline  Ongoing, with pt/wife instructed in HEP 12/07/17 and 12/12/17 visits    Time  4    Period  Weeks    Status  On-going    Target Date  12/29/17      PT SHORT TERM GOAL #2   Title  Pt will perform sit to stand with supervision, for improved transfer efficiency and safety with gait.    Baseline  min assist>min guard assist 12/12/17    Time  4    Period  Weeks    Status  On-going  PT SHORT TERM GOAL #3   Title  Pt will ambulate at least 100 ft using hemiwalker with minimal assistance for improved gait safety and household mobility.    Baseline  ongoing, 50 ft on 12/12/17 visit     Time  4    Period  Weeks    Status  On-going      PT SHORT TERM GOAL #4   Title  Stand pivot transfer to be assessed, with pt performing stand pivot transfer with minimal assistance, for improved participation in transfers at home.    Baseline  mod>min assist for SPT 12/12/17    Time  4    Period  Weeks    Status  On-going      PT SHORT TERM GOAL #5   Title  Pt/wife will verbalize understanding of fall prevention in home environment.    Time  4    Period  Weeks    Status  On-going      Additional Short Term Goals   Additional Short Term Goals  Yes      PT SHORT TERM GOAL #6   Title  Berg score to increase by at least 8 points, for improved standing balance and decreased fall risk.    Time  4    Period  Weeks    Status  New      PT SHORT TERM GOAL #7   Title  Pt will tolerate treatment of L BPPV with canalith repositioning maneuver    Status  New    Target Date  12/29/17        PT Long Term Goals - 01/09/18 1517      PT LONG TERM GOAL #1   Title  Pt/wife will  verbalize plans for continued community fitness upon d/c from PT.  TARGET 01/28/18    Time  8    Period  Weeks    Status  On-going      PT LONG TERM GOAL #2   Title  Pt will perform sit to stand transfers modified independently, for improved safety and independence with transfers.    Time  8    Period  Weeks    Status  On-going      PT LONG TERM GOAL #3   Title  Pt will ambulate at least 300 ft using least restrictive assistive device, with supervision, for improved safety and independence with gait.    Time  8    Period  Weeks    Status  Revised      PT LONG TERM GOAL #4   Title  GAit velocity to be assessed, with goal to be set as appropriate.    Time  8    Period  Weeks    Status  On-going      PT LONG TERM GOAL #5   Title  Pt will negotiate 12 steps using handrail, with supervision, for improved safety and independence with stair negotiation for home.    Time  8    Period  Weeks    Status  On-going      Additional Long Term Goals   Additional Long Term Goals  Yes      PT LONG TERM GOAL #6   Title  Pt will report 0/10 spinning/dizziness with rolling in bed and supine <> sit due to resolution of L BPPV    Status  New    Target Date  01/28/18  Plan - 01/17/18 1223    Clinical Impression Statement  Pt and wife report no return vestibular issues today entering PT session.  Pt agreeable to work on standing and gait activities to look at goal progression.  After 2 bouts of gait (with sitting breaks in between) and brief (<2 minute) standing at counter, pt becomes pale and requests to sit, with low blood pressure reading noted.  Discussed with wife and patient importance of trying to be sitting up and participating in brief standing at home, and requested wife talk to physician at appt tomorrow regarding fluctuation of blood pressure readings, limiting standing and functional mobility activities.    Rehab Potential  Good    Clinical Impairments Affecting Rehab  Potential  Good family support; severity of deficits    PT Frequency  Other (comment) 2x/week but week of 7/8 3 visits to treat BPPV    PT Duration  8 weeks    PT Treatment/Interventions  ADLs/Self Care Home Management;DME Instruction;Gait training;Stair training;Functional mobility training;Therapeutic activities;Therapeutic exercise;Balance training;Orthotic Fit/Training;Patient/family education;Neuromuscular re-education;Canalith Repostioning;Vestibular    PT Next Visit Plan  Check BP; treat BPPV as indicated; work on standing quad/glut strengthening on RLE in weightbearing; gait training with foot-up versus AFO trial.    Consulted and Agree with Plan of Care  Patient;Family member/caregiver    Family Member Consulted  wife       Patient will benefit from skilled therapeutic intervention in order to improve the following deficits and impairments:  Abnormal gait, Decreased activity tolerance, Decreased balance, Decreased knowledge of precautions, Decreased endurance, Decreased knowledge of use of DME, Decreased mobility, Decreased safety awareness, Difficulty walking, Decreased strength, Impaired tone, Postural dysfunction, Dizziness  Visit Diagnosis: Unsteadiness on feet  Other abnormalities of gait and mobility  Muscle weakness (generalized)     Problem List There are no active problems to display for this patient.   Ronte Parker W. 01/17/2018, 12:29 PM  Gean MaidensMARRIOTT,Josphine Laffey W., PT   [facility] [address] [place], [place], [address] Phone: [phone]   Fax:  [phone]  Name: Pennelope BrackenJames M Rocca MRN: [id number] Date of Birth: 03/06/1956

## 2018-01-18 ENCOUNTER — Ambulatory Visit: Payer: BLUE CROSS/BLUE SHIELD | Admitting: Physical Therapy

## 2018-01-18 ENCOUNTER — Ambulatory Visit: Payer: BLUE CROSS/BLUE SHIELD

## 2018-01-18 ENCOUNTER — Telehealth: Payer: Self-pay | Admitting: Physical Therapy

## 2018-01-18 ENCOUNTER — Ambulatory Visit: Payer: BLUE CROSS/BLUE SHIELD | Admitting: Occupational Therapy

## 2018-01-18 ENCOUNTER — Encounter: Payer: Self-pay | Admitting: Occupational Therapy

## 2018-01-18 VITALS — BP 116/90 | HR 91

## 2018-01-18 VITALS — BP 142/93 | HR 83

## 2018-01-18 DIAGNOSIS — R4701 Aphasia: Secondary | ICD-10-CM

## 2018-01-18 DIAGNOSIS — R2689 Other abnormalities of gait and mobility: Secondary | ICD-10-CM

## 2018-01-18 DIAGNOSIS — R482 Apraxia: Secondary | ICD-10-CM

## 2018-01-18 DIAGNOSIS — R2681 Unsteadiness on feet: Secondary | ICD-10-CM

## 2018-01-18 DIAGNOSIS — R29898 Other symptoms and signs involving the musculoskeletal system: Secondary | ICD-10-CM

## 2018-01-18 DIAGNOSIS — R293 Abnormal posture: Secondary | ICD-10-CM

## 2018-01-18 DIAGNOSIS — M6281 Muscle weakness (generalized): Secondary | ICD-10-CM | POA: Diagnosis not present

## 2018-01-18 DIAGNOSIS — I69318 Other symptoms and signs involving cognitive functions following cerebral infarction: Secondary | ICD-10-CM

## 2018-01-18 DIAGNOSIS — R29818 Other symptoms and signs involving the nervous system: Secondary | ICD-10-CM

## 2018-01-18 DIAGNOSIS — I69351 Hemiplegia and hemiparesis following cerebral infarction affecting right dominant side: Secondary | ICD-10-CM

## 2018-01-18 NOTE — Therapy (Signed)
[facility] [address] [place], [place], [address] Phone: [phone]   Fax:  [phone]  Physical Therapy Treatment  Patient Details  Name: Tim Stephens MRN: [id number] Date of Birth: January 27, 1956 Referring Provider: Scarlette Ar, MD   Encounter Date: 01/18/2018  PT End of Session - 01/18/18 2144    Visit Number  10    Number of Visits  33 Pt has been approved for 16 at this time    Date for PT Re-Evaluation  01/28/18    Authorization Type  BC[employer]- initial 8 visits approved/can ask for more    Authorization Time Period  12/13/17-02/12/18 (2nd set of 8 visits approved)    Authorization - Visit Number  10    Authorization - Number of Visits  16    PT Start Time  0263    PT Stop Time  1445    PT Time Calculation (min)  40 min    Equipment Utilized During Treatment  Other (comment) AFO    Activity Tolerance  Patient tolerated treatment well low blood pressure upon standing    Behavior During Therapy  [facility] for tasks assessed/performed       No past medical history on file.  No past surgical history on file.  Vitals:   01/18/18 1410 01/18/18 1440  BP: 120/88 (!) 142/93  Pulse: 77 83    Subjective Assessment - 01/18/18 2135    Subjective  No further dizziness; wearing compression stockings today.  Wife advised by RN to give pt BP medication only if within specific parameters to avoid hypotension.  No lightheadedness today.    Patient is accompained by:  Family member wife    Pertinent History  Recently, pt on hold for PT due to medical issues and having skull flap surgery.  Tim Stephens is a 62 year old right hand dominant male with past medical history significant for hypertension and hyperlipidemia who was admitted to [facility] on 05/30/2017 with a aphasia and right hemibody weakness. CTA was obtained and revealed left M1 cutoff. He was taken to IR by Dr. Bridgette Habermann for cerebral angiography with mechanical thrombectomy. The  patient's acute hospital course was complicated by worsening cytotoxic edema as well areas of hemorrhagic conversion with left to right midline shift. He was taken to the OR on 06/01/2017 for decompressive left hemicraniectomy. [name] He was found to have a right peroneal, right posterior tibial, right gastrocnemius,left basilic, and left axillary venous thrombi. He then developed totally occluding superficial thrombophlebitis of the left cephalic vein and right cephalic vein. Venous thrombus then progressed in the left upper arm to the left brachial vein. The patient was not anticoagulated due to recent hemorrhagic conversion of stroke. He underwent serial scans to evaluate for progression of venous thrombosis. PEG was placed by Dr. [name] on 06/15/2017. There was progression of right lower extremity DVT from the posterior tibial vein to the right common femoral vein. Vascular surgery was consulted and placed an inferior vena cava filter on 06/18/2017. expressive aphasia, verbal apraxia; therapy at [facility] January 2019, then [facility] rehab for 4 weeks, then [facility] therapy PT, OT, speech discharged 09/21/17    Patient Stated Goals  Per wife:  to get strong enough to get upstairs, and use the bathroom on his own; trying to be more mobile outside the home-fishing, more recreational things    Currently in Pain?  No/denies  [place] Adult PT Treatment/Exercise - 01/18/18 2137      Transfers   Transfers  Sit to Stand;Stand to Sit    Sit to Stand  4: Min guard    Stand to Sit  4: Min guard      Ambulation/Gait   Ambulation/Gait  Yes    Ambulation/Gait Assistance  4: Min assist    Ambulation/Gait Assistance Details  Continued AFO assessment with gait - donned posterior leaf spring to determine if PLS would be sufficient to provide pt with adequate DF assistance, foot clearance and knee control.  Performed gait with hemi walker around gym with step to sequence and  therapist on R side providing upright postural control and control of forward weight shift.  No symptoms of orthostasis during gait.  Transitioned to // bars with LUE forwards and retro x 2 reps with pt gliding and then floating LUE on bar to allow step through and more continuous gait sequence. Removed AFO for final repetition to assess difference; increased toe drag, decreased step length and foot clearance noted and decreased efficiency of gait.    Ambulation Distance (Feet)  115 Feet    Assistive device  Hemi-walker;Parallel bars;Other (Comment) trial AFO    Gait Pattern  Decreased stance time - right;Decreased dorsiflexion - right;Right genu recurvatum;Step-through pattern;Trunk flexed;Step-to pattern    Ambulation Surface  Level;Indoor      Therapeutic Activites    Therapeutic Activities  Other Therapeutic Activities    Other Therapeutic Activities  Discussed use of Bioness functional electrical stimulation to treatment interventions; pt and wife agreeable.  Will seek clearance from MD due to possible seizures in the past             PT Education - 01/18/18 2143    Education provided  Yes    Education Details  Bioness, AFO    Person(s) Educated  Patient;Spouse    Methods  Explanation;Demonstration    Comprehension  Verbalized understanding       PT Short Term Goals - 01/18/18 2145      PT SHORT TERM GOAL #1   Title  Pt will perform HEP with wife's supervision for improved balance, strength, gait.  TARGET 12/29/17 (STGS to be fully assessed week of 01/16/18, due to pt missing several weeks of therapy)    Baseline  Ongoing, with pt/wife instructed in HEP 12/07/17 and 12/12/17 visits    Time  4    Period  Weeks    Status  On-going      PT SHORT TERM GOAL #2   Title  Pt will perform sit to stand with supervision, for improved transfer efficiency and safety with gait.    Baseline  min assist>min guard assist 12/12/17    Time  4    Period  Weeks    Status  Partially Met      PT  SHORT TERM GOAL #3   Title  Pt will ambulate at least 100 ft using hemiwalker with minimal assistance for improved gait safety and household mobility.    Baseline  ongoing, 50 ft on 12/12/17 visit ; met on 7/11    Time  4    Period  Weeks    Status  Partially Met      PT SHORT TERM GOAL #4   Title  Stand pivot transfer to be assessed, with pt performing stand pivot transfer with minimal assistance, for improved participation in transfers at home.    Baseline  mod>min assist for SPT 12/12/17; met  on 7/11    Time  4    Period  Weeks    Status  Partially Met      PT SHORT TERM GOAL #5   Title  Pt/wife will verbalize understanding of fall prevention in home environment.    Time  4    Period  Weeks    Status  On-going      PT SHORT TERM GOAL #6   Title  Berg score to increase by at least 8 points, for improved standing balance and decreased fall risk.    Time  4    Period  Weeks    Status  On-going      PT SHORT TERM GOAL #7   Title  Pt will tolerate treatment of L BPPV with canalith repositioning maneuver    Baseline  met on 7/8 - BPPV cleared     Status  Achieved        PT Long Term Goals - 01/09/18 1517      PT LONG TERM GOAL #1   Title  Pt/wife will verbalize plans for continued community fitness upon d/c from PT.  TARGET 01/28/18    Time  8    Period  Weeks    Status  On-going      PT LONG TERM GOAL #2   Title  Pt will perform sit to stand transfers modified independently, for improved safety and independence with transfers.    Time  8    Period  Weeks    Status  On-going      PT LONG TERM GOAL #3   Title  Pt will ambulate at least 300 ft using least restrictive assistive device, with supervision, for improved safety and independence with gait.    Time  8    Period  Weeks    Status  Revised      PT LONG TERM GOAL #4   Title  GAit velocity to be assessed, with goal to be set as appropriate.    Time  8    Period  Weeks    Status  On-going      PT LONG TERM GOAL #5    Title  Pt will negotiate 12 steps using handrail, with supervision, for improved safety and independence with stair negotiation for home.    Time  8    Period  Weeks    Status  On-going      Additional Long Term Goals   Additional Long Term Goals  Yes      PT LONG TERM GOAL #6   Title  Pt will report 0/10 spinning/dizziness with rolling in bed and supine <> sit due to resolution of L BPPV    Status  New    Target Date  01/28/18            Plan - 01/18/18 2147    Clinical Impression Statement  No further vestibular treatment required today.  Continued gait assessment and gait training with slightly more supportive trial AFO - posterior leaf spring with hemi walker and then in // bars to allow more continuous gait sequence.  Pt tolerated well with no symptoms of orthostasis and improved BP today.  Discussed possible addition of Bioness to interventions; pt and wife agreeable.  Will seek approval from physician and will request order for AFO consult.  Will continue to address in order to progress towards LTG.    Rehab Potential  Good    Clinical Impairments Affecting Rehab Potential  Good family support; severity of deficits    PT Frequency  Other (comment) 2x/week but week of 7/8 3 visits to treat BPPV    PT Duration  8 weeks    PT Treatment/Interventions  ADLs/Self Care Home Management;DME Instruction;Gait training;Stair training;Functional mobility training;Therapeutic activities;Therapeutic exercise;Balance training;Orthotic Fit/Training;Patient/family education;Neuromuscular re-education;Canalith Repostioning;Vestibular    PT Next Visit Plan  CHECK BP AND MONITOR FOR ORTHOSTASIS.  Need to get order for AFO consult and clearance for Bioness (will also have to add to POC -wait until recertify on 8/11??).  Continue gait training with hemiwalker and trial PLS.  Stair negotiation.  NMR R LE.  Trunk/postural control    Consulted and Agree with Plan of Care  Patient;Family member/caregiver     Family Member Consulted  wife       Patient will benefit from skilled therapeutic intervention in order to improve the following deficits and impairments:  Abnormal gait, Decreased activity tolerance, Decreased balance, Decreased knowledge of precautions, Decreased endurance, Decreased knowledge of use of DME, Decreased mobility, Decreased safety awareness, Difficulty walking, Decreased strength, Impaired tone, Postural dysfunction, Dizziness  Visit Diagnosis: Muscle weakness (generalized)  Unsteadiness on feet  Other abnormalities of gait and mobility  Abnormal posture     Problem List There are no active problems to display for this patient.  Rico Junker, PT, DPT 01/18/18    9:54 PM    [facility] [address] [place], [place], [address] Phone: [phone]   Fax:  [phone]  Name: Tim Stephens MRN: [id number] Date of Birth: 10-05-1955

## 2018-01-18 NOTE — Patient Instructions (Signed)
  Please complete the assigned speech therapy homework prior to your next session and return it to the speech therapist at your next visit.  

## 2018-01-18 NOTE — Therapy (Signed)
[facility] [address] [place], [place], [address] Phone: [phone]   Fax:  [phone]  Occupational Therapy Evaluation  Patient Details  Name: RANA ADORNO MRN: [id number] Date of Birth: June 17, 1956 Referring Provider: Jenita Seashore, MD   Encounter Date: 01/18/2018  OT End of Session - 01/18/18 1636    Visit Number  9    Number of Visits  22    Date for OT Re-Evaluation  02/27/18    Authorization Type  BC[employer]    Authorization Time Period  pt has been approved for additional 8 visits by 02/12/2018 (total of 16)    Authorization - Visit Number  9    Authorization - Number of Visits  16    OT Start Time  1316    OT Stop Time  1400    OT Time Calculation (min)  44 min    Activity Tolerance  Patient tolerated treatment well       History reviewed. No pertinent past medical history.  History reviewed. No pertinent surgical history.  Vitals:   01/18/18 1329  BP: 116/90  Pulse: 91    Subjective Assessment - 01/18/18 1320    Subjective   "Yes"  smiling and pointing to RUE during session    Patient is accompained by:  Family member wife    Pertinent History  MONITOR BP!!!  05/30/2017 admitted to [facility] with LMI cutoff, s/p thromectomy with resultant L CVA due mulitple thromboses;  IVC filter with Coumadin, 06/01/2017 craniotomy    Patient Stated Goals  unable to state due to aphasia    Currently in Pain?  No/denies                  OT Treatments/Exercises (OP) - 01/18/18 0001      Neurological Re-education Exercises   Other Exercises 1  Neuro re ed to address RUE isolated movement on moveable surfaces in sitting with emphasis on low reach patterns in closed chain activity. Pt initially required max faciltation and cueing to use arm vs body to move surface however with practice, tactile cues and repetition pt able to progress to needing only tactile cueing. Pt able to move surface in low reach pattern as  well as horizontal ab and adduction patterns with improving control during session. Also addressed bilateral holding and moving of small ball  with moderate facilitation.  Pt and wife instructed how to use hemi walker at home as part of HEP with R hand ace wrapped to hemi walker.  [name] BP stable today - pt now wearing thigh high stockings and med adjustment made at MD appt yesterday.               OT Short Term Goals - 01/18/18 1635      OT SHORT TERM GOAL #1   Title  Pt and wife will be mod I with HEP for RUE ROM - 01/30/2018 (renewed)    Status  On-going      OT SHORT TERM GOAL #2   Title  Pt will be min a for grooming with mod cues from wife and intermittent set up prn    Status  Achieved      OT SHORT TERM GOAL #3   Title  Pt will be mod a for UB bathing at wheelchair level with mod cues    Status  Achieved      OT SHORT TERM GOAL #4   Title  Pt will be mod a for  LB bathing at sit to stand level  with mod cues and set up    Status  Achieved      OT SHORT TERM GOAL #5   Title  Pt will be max a for UB dressing at wheelchair level, mod cues    Status  Achieved      OT SHORT TERM GOAL #6   Title  Pt and wife will explore options for 3 in 1 commode for pt to use in bathroom or at bedside    Status  Achieved      OT SHORT TERM GOAL #7   Title  Pt will use RUE as stabilizer 25% of the time during basic self care skills with min a and cues.     Status  On-going        OT Long Term Goals - 01/18/18 1635      OT LONG TERM GOAL #1   Title  Pt and wife will be mod I with home activity program designed to increase independence in basic self care as well as address cognition - 02/27/2018 (renewed)    Status  On-going      OT LONG TERM GOAL #2   Title  Pt will be min a for UB bathing after set up with mod cues    Status  Achieved      OT LONG TERM GOAL #3   Title  Pt will be min a for UB dressing with mod cues    Status  Achieved      OT LONG TERM GOAL #4   Title  Pt  will be mod a for LB bathing with mod cues    Status  On-going      OT LONG TERM GOAL #5   Title  Pt will be mod a for LB dressing with mod cues    Status  On-going      OT LONG TERM GOAL #6   Title  Pt will be min a for toilet transfers in bathroom (able to ambulate 3 steps into bathroom with wife).      Status  On-going      OT LONG TERM GOAL #7   Title  Pt will be mod a for clothing mmgt with toileting    Status  On-going      OT LONG TERM GOAL #8   Title  Pt will demonstrate ability to use RUE as stabilizer during basic self care 50% of the time with min a and cues.     Status  On-going            Plan - 01/18/18 1635    Clinical Impression Statement  Pt progressing toward goals.  Pt with much more  stable BP today and tolerated session well.    Occupational Profile and client history currently impacting functional performance  HLD, HTN, s/p craniotomy, IVC filter with Coumadin, seizures.    Occupational performance deficits (Please refer to evaluation for details):  ADL's;IADL's;Rest and Sleep;Work;Leisure;Social Participation    Rehab Potential  Good    Current Impairments/barriers affecting progress:  severity of deficits    OT Frequency  2x / week    OT Duration  8 weeks    OT Treatment/Interventions  Self-care/ADL training;Moist Heat;Therapeutic exercise;Neuromuscular education;DME and/or AE instruction;Manual Therapy;Functional Mobility Training;Passive range of motion;Therapeutic activities;Splinting;Patient/family education;Balance training    Plan  NMR trunk/RUE, sit to stand, stand to sit, balance, functional mobility, possible use of Give Mohr sling for ambulation  Consulted and Agree with Plan of Care  Patient;Family member/caregiver    Family Member Consulted  [name]       Patient will benefit from skilled therapeutic intervention in order to improve the following deficits and impairments:  Abnormal gait, Decreased activity tolerance, Decreased  balance, Decreased cognition, Decreased knowledge of use of DME, Decreased mobility, Decreased range of motion, Decreased safety awareness, Difficulty walking, Decreased strength, Impaired UE functional use, Impaired tone, Impaired sensation, Pain  Visit Diagnosis: Unsteadiness on feet  Muscle weakness (generalized)  Abnormal posture  Hemiplegia and hemiparesis following cerebral infarction affecting right dominant side (HCC)  Other symptoms and signs involving cognitive functions following cerebral infarction  Other symptoms and signs involving the nervous system  Apraxia  Other symptoms and signs involving the musculoskeletal system    Problem List There are no active problems to display for this patient.   Norton Pastelulaski, Janthony Holleman Halliday, OTR/L 01/18/2018, 4:38 PM  [facility] [address] [place], [place], [address] Phone: [phone]   Fax:  [phone]  Name: Pennelope BrackenJames M Moseley MRN: [id number] Date of Birth: 07/15/1955

## 2018-01-18 NOTE — Telephone Encounter (Signed)
Entered in error - fax to be sent to physician regarding clearance for Bioness and AFO order.  Dierdre HighmanAudra F Bracken Moffa, PT, DPT 01/18/18    9:56 PM

## 2018-01-18 NOTE — Therapy (Signed)
[facility] [address] [place], [place], [address] Phone: [phone]   Fax:  [phone]  Speech Language Pathology Treatment  Patient Details  Name: Tim Stephens MRN: [id number] Date of Birth: 1956-02-15 Referring Provider: Dr. Scarlette Ar   Encounter Date: 01/18/2018  End of Session - 01/18/18 1654    Visit Number  12    Number of Visits  17    Date for SLP Re-Evaluation  01/24/18    Authorization Type  30 total ST visits, no auth. Per spouse HHST used 8 visits (22 remaining; 19 after eval and visit 1). Spouse agrees to leave 4 visits prior to end of year. See auth visits    Authorization - Visit Number  21    Authorization - Number of Visits  26    SLP Start Time  1450    SLP Stop Time   3428    SLP Time Calculation (min)  40 min    Activity Tolerance  Patient tolerated treatment well       History reviewed. No pertinent past medical history.  History reviewed. No pertinent surgical history.  There were no vitals filed for this visit.  Subjective Assessment - 01/18/18 1647    Subjective  Pt brought Lingraphica today.    Patient is accompained by:  -- [name], wife    Currently in Pain?  No/denies            ADULT SLP TREATMENT - 01/18/18 1647      General Information   Behavior/Cognition  Alert;Cooperative;Pleasant mood      Treatment Provided   Treatment provided  Cognitive-Linquistic      Cognitive-Linquistic Treatment   Treatment focused on  Aphasia;Apraxia    Skilled Treatment  Pt with Lingraphica on and ready for use during therapy. SLP spent the session educating pt/wife how to perform basic functions like icon generation, icon deletion, drilling down on SGD to restaurants and to personal data. Pt typed in his own icon name/message by copying SLP written cues with 90% success, 100% with self-correction. SLP reiterated to pt and wife that in order for pt to become proficient on device he  will need to use it. SLP told pt wife to provide food choices and let pt choose via use of Lingraphica. Pt also got experience with SLP guidance, for getting an image from the internet to use for an icon.      Assessment / Recommendations / Plan   Plan  Continue with current plan of care      Progression Toward Goals   Progression toward goals  Progressing toward goals       SLP Education - 01/18/18 1653    Education provided  Yes    Education Details  Lingraphica usage - deleting icons, making icons, getting image from internet    Person(s) Educated  Patient;Spouse    Methods  Explanation;Demonstration;Verbal cues    Comprehension  Returned demonstration;Verbal cues required;Need further instruction       SLP Short Term Goals - 01/09/18 1604      SLP SHORT TERM GOAL #1   Title  Pt will ID object f:5 to sentence description with 90% accuracy and occasional min A    Time  1    Period  Weeks    Status  Not Met      SLP SHORT TERM GOAL #2   Title  Pt will complete automatic speech tasks with 70% accuracy and visual cues over  3 sessions    Time  1    Period  Weeks    Status  Not Met      SLP SHORT TERM GOAL #3   Title  Pt will utilize multimodal communication and AAC to communicate wants and answer questions with 70% accuracy and occasional min A over 3 sessions    Time  1    Period  Weeks    Status  Not Met      SLP SHORT TERM GOAL #4   Title  Pt will write/type at word level with 80% accuracy and occasional min A over 2 sessions    Time  1    Period  Weeks    Status  Not Met       SLP Long Term Goals - 01/18/18 1655      SLP LONG TERM GOAL #1   Title  Pt will comprehend mildly complex conversation over 8 minute conversation with appropriate responses (mulitmodal) and 2 or less requests for repetition    Baseline  01/09/18    Status  Achieved      SLP LONG TERM GOAL #2   Title  Pt will utilize multimodal communication/ACC to produce 2 word response to  conversation/question 8/10x with occasional min A    Time  2    Period  Weeks    Status  On-going      SLP LONG TERM GOAL #3   Title  Pt will write 1 word  with 70% accuracy and occasional min A    Time  3    Period  Weeks    Status  Revised      SLP LONG TERM GOAL #4   Title  Pt will produce 1-2 syllable words in phrase completion task with 70% accuracy and occassional mod A    Baseline  01/04/18    Time  2    Period  Weeks    Status  On-going       Plan - 01/18/18 1654    Clinical Impression Statement  Severe non fluent Broca's aphasia persists. SLP focused today's session on basic use of Lingraphica to make and delete icons. See "skiled intervention" for more details. Continue skilled ST to maximize communication.     Speech Therapy Frequency  2x / week    Duration  -- 8 weeks or 17 total sessions    Treatment/Interventions  Language facilitation;Environmental controls;Cueing hierarchy;Oral motor exercises;SLP instruction and feedback;Compensatory strategies;Functional tasks;Cognitive reorganization;Internal/external aids;Multimodal communcation approach;Patient/family education;Other (comment)    Potential to Achieve Goals  Good       Patient will benefit from skilled therapeutic intervention in order to improve the following deficits and impairments:   Aphasia  Verbal apraxia    Problem List There are no active problems to display for this patient.   [facility] ,[place], [place]  01/18/2018, 4:56 PM  [facility] [address] [place], [place], [address] Phone: [phone]   Fax:  [phone]   Name: Tim Stephens MRN: [id number] Date of Birth: 1955-12-12

## 2018-01-23 ENCOUNTER — Encounter: Payer: Self-pay | Admitting: Occupational Therapy

## 2018-01-23 ENCOUNTER — Ambulatory Visit: Payer: BLUE CROSS/BLUE SHIELD | Admitting: Occupational Therapy

## 2018-01-23 ENCOUNTER — Ambulatory Visit: Payer: BLUE CROSS/BLUE SHIELD | Admitting: Physical Therapy

## 2018-01-23 ENCOUNTER — Ambulatory Visit: Payer: BLUE CROSS/BLUE SHIELD

## 2018-01-23 VITALS — BP 111/87 | HR 103

## 2018-01-23 VITALS — BP 108/92 | HR 90

## 2018-01-23 DIAGNOSIS — R4701 Aphasia: Secondary | ICD-10-CM

## 2018-01-23 DIAGNOSIS — R29898 Other symptoms and signs involving the musculoskeletal system: Secondary | ICD-10-CM

## 2018-01-23 DIAGNOSIS — R2681 Unsteadiness on feet: Secondary | ICD-10-CM

## 2018-01-23 DIAGNOSIS — R29818 Other symptoms and signs involving the nervous system: Secondary | ICD-10-CM

## 2018-01-23 DIAGNOSIS — R482 Apraxia: Secondary | ICD-10-CM

## 2018-01-23 DIAGNOSIS — M6281 Muscle weakness (generalized): Secondary | ICD-10-CM

## 2018-01-23 DIAGNOSIS — I69351 Hemiplegia and hemiparesis following cerebral infarction affecting right dominant side: Secondary | ICD-10-CM

## 2018-01-23 DIAGNOSIS — I69318 Other symptoms and signs involving cognitive functions following cerebral infarction: Secondary | ICD-10-CM

## 2018-01-23 DIAGNOSIS — R293 Abnormal posture: Secondary | ICD-10-CM

## 2018-01-23 NOTE — Therapy (Signed)
[facility] [address] [place], [place], [address] Phone: [phone]   Fax:  [phone]  Occupational Therapy Treatment  Patient Details  Name: Tim Stephens MRN: [id number] Date of Birth: 06-21-1956 Referring Provider: Jenita Seashore, MD   Encounter Date: 01/23/2018  OT End of Session - 01/23/18 1508    Visit Number  10    Number of Visits  22    Date for OT Re-Evaluation  02/27/18    Authorization Type  BCBS    Authorization Time Period  pt has been approved for additional 8 visits by 02/12/2018 (total of 16)    Authorization - Visit Number  10    Authorization - Number of Visits  16    OT Start Time  1317    OT Stop Time  1400    OT Time Calculation (min)  43 min    Activity Tolerance  Treatment limited secondary to medical complications (Comment) BP issues       History reviewed. No pertinent past medical history.  History reviewed. No pertinent surgical history.  Vitals:   01/23/18 1506  BP: 111/87  Pulse: (!) 103    Subjective Assessment - 01/23/18 1320    Subjective   Oh wow (when working on grasp with patient)    Patient is accompained by:  Family member [name]    Pertinent History  MONITOR BP!!!  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  No/denies                   OT Treatments/Exercises (OP) - 01/23/18 0001      Neurological Re-education Exercises   Other Exercises 1  Neuro re ed to address RUE for isolated movement of shoulder flexion./extension, elbow flexion/extension with empahsis on using RUE vs body on moveable surface (stool) - initially pt had difficulty isolating movement however quickly able to use feedback from moveable surface and tactile cues as feedback for isolated  controlled movement.  Also addressed beginning grasp - when assisted reach is incorporate into activity of reaching for drinking cup, pt able to initiate reach as well as grasp with R hand.  Practiced reaching for and using both hands to hold cup and bring cup to mouth with pt using L hand to assist R hand in holding cup.  Pt with increased muscle activation in R hand with repetition. Pt and wife to practice pt using both hands during meals to drink from cup using straw. Pt able to stabilize and hold R hand on cup (with cup on table) without assistance from L hand.  Near end of session pt became light headed - assisted pt into supine position and BP at that time was 106/84, HR 89.  Assisted pt back to sitting and BP dropped to 81/61, HR 81.  Pt again became light headed and assisted in supine again with bp at 106/62, HR 88.  Pt then monitored by PT for next session -see PT notes.                OT Short Term Goals - 01/23/18 1506      OT SHORT TERM GOAL #1   Title  Pt and wife will be mod I with HEP for RUE ROM - 01/30/2018 (renewed)  Status  On-going      OT SHORT TERM GOAL #2   Title  Pt will be min a for grooming with mod cues from wife and intermittent set up prn    Status  Achieved      OT SHORT TERM GOAL #3   Title  Pt will be mod a for UB bathing at wheelchair level with mod cues    Status  Achieved      OT SHORT TERM GOAL #4   Title  Pt will be mod a for LB bathing at sit to stand level  with mod cues and set up    Status  Achieved      OT SHORT TERM GOAL #5   Title  Pt will be max a for UB dressing at wheelchair level, mod cues    Status  Achieved      OT SHORT TERM GOAL #6   Title  Pt and wife will explore options for 3 in 1 commode for pt to use in bathroom or at bedside    Status  Achieved      OT SHORT TERM GOAL #7   Title  Pt will use RUE as stabilizer 25% of the time during basic self care skills with min a and cues.     Status  On-going        OT Long  Term Goals - 01/23/18 1506      OT LONG TERM GOAL #1   Title  Pt and wife will be mod I with home activity program designed to increase independence in basic self care as well as address cognition - 02/27/2018 (renewed)    Status  On-going      OT LONG TERM GOAL #2   Title  Pt will be min a for UB bathing after set up with mod cues    Status  Achieved      OT LONG TERM GOAL #3   Title  Pt will be min a for UB dressing with mod cues    Status  Achieved      OT LONG TERM GOAL #4   Title  Pt will be mod a for LB bathing with mod cues    Status  On-going      OT LONG TERM GOAL #5   Title  Pt will be mod a for LB dressing with mod cues    Status  On-going      OT LONG TERM GOAL #6   Title  Pt will be min a for toilet transfers in bathroom (able to ambulate 3 steps into bathroom with wife).      Status  On-going      OT LONG TERM GOAL #7   Title  Pt will be mod a for clothing mmgt with toileting    Status  On-going      OT LONG TERM GOAL #8   Title  Pt will demonstrate ability to use RUE as stabilizer during basic self care 50% of the time with min a and cues.     Status  On-going            Plan - 01/23/18 1507    Clinical Impression Statement  Pt with slow progress toward goals. Pt has active isolated movement in RUE however very difficult for pt to initiate movement due to severe sensory loss, cognitve deficits.      Occupational Profile and client history currently impacting functional performance  HLD, HTN, s/p craniotomy,  IVC filter with Coumadin, seizures.    Occupational performance deficits (Please refer to evaluation for details):  ADL's;IADL's;Rest and Sleep;Work;Leisure;Social Participation    Rehab Potential  Good    Current Impairments/barriers affecting progress:  severity of deficits    OT Frequency  2x / week    OT Duration  8 weeks    OT Treatment/Interventions  Self-care/ADL training;Moist Heat;Therapeutic exercise;Neuromuscular education;DME and/or AE  instruction;Manual Therapy;Functional Mobility Training;Passive range of motion;Therapeutic activities;Splinting;Patient/family education;Balance training    Plan  NMR trunk/RUE, sit to stand, stand to sit, balance, functional mobility, possible use of Give Mohr sling for ambulation    Consulted and Agree with Plan of Care  Patient;Family member/caregiver    Family Member Consulted  [name]       Patient will benefit from skilled therapeutic intervention in order to improve the following deficits and impairments:  Abnormal gait, Decreased activity tolerance, Decreased balance, Decreased cognition, Decreased knowledge of use of DME, Decreased mobility, Decreased range of motion, Decreased safety awareness, Difficulty walking, Decreased strength, Impaired UE functional use, Impaired tone, Impaired sensation, Pain  Visit Diagnosis: Unsteadiness on feet  Muscle weakness (generalized)  Abnormal posture  Hemiplegia and hemiparesis following cerebral infarction affecting right dominant side (HCC)  Other symptoms and signs involving cognitive functions following cerebral infarction  Other symptoms and signs involving the nervous system  Apraxia  Other symptoms and signs involving the musculoskeletal system    Problem List There are no active problems to display for this patient.   Norton Pastelulaski, Karen Halliday, OTR/L 01/23/2018, 3:10 PM  [facility] [address] [place], [place], [address] Phone: [phone]   Fax:  [phone]  Name: Tim Stephens MRN: [id number] Date of Birth: 09/01/1955

## 2018-01-24 ENCOUNTER — Ambulatory Visit: Payer: BLUE CROSS/BLUE SHIELD | Admitting: Occupational Therapy

## 2018-01-24 ENCOUNTER — Ambulatory Visit: Payer: BLUE CROSS/BLUE SHIELD | Admitting: Physical Therapy

## 2018-01-24 ENCOUNTER — Ambulatory Visit: Payer: BLUE CROSS/BLUE SHIELD

## 2018-01-24 ENCOUNTER — Encounter: Payer: Self-pay | Admitting: Occupational Therapy

## 2018-01-24 VITALS — BP 118/91

## 2018-01-24 DIAGNOSIS — R29818 Other symptoms and signs involving the nervous system: Secondary | ICD-10-CM

## 2018-01-24 DIAGNOSIS — R2689 Other abnormalities of gait and mobility: Secondary | ICD-10-CM

## 2018-01-24 DIAGNOSIS — R2681 Unsteadiness on feet: Secondary | ICD-10-CM

## 2018-01-24 DIAGNOSIS — R42 Dizziness and giddiness: Secondary | ICD-10-CM

## 2018-01-24 DIAGNOSIS — M6281 Muscle weakness (generalized): Secondary | ICD-10-CM

## 2018-01-24 DIAGNOSIS — R293 Abnormal posture: Secondary | ICD-10-CM

## 2018-01-24 DIAGNOSIS — R4701 Aphasia: Secondary | ICD-10-CM

## 2018-01-24 DIAGNOSIS — R482 Apraxia: Secondary | ICD-10-CM

## 2018-01-24 DIAGNOSIS — I69318 Other symptoms and signs involving cognitive functions following cerebral infarction: Secondary | ICD-10-CM

## 2018-01-24 DIAGNOSIS — I69351 Hemiplegia and hemiparesis following cerebral infarction affecting right dominant side: Secondary | ICD-10-CM

## 2018-01-24 NOTE — Therapy (Signed)
[facility] [address] [place], [place], [address] Phone: [phone]   Fax:  [phone]  Speech Language Pathology Treatment  Patient Details  Name: Tim Stephens MRN: [id number] Date of Birth: 05/21/1956 Referring Provider: Dr. Scarlette Ar   Encounter Date: 01/23/2018  End of Session - 01/24/18 1746    Visit Number  13    Number of Visits  17    Date for SLP Re-Evaluation  03/10/18    Authorization Type  30 total ST visits, no auth. Per spouse HHST used 8 visits (22 remaining; 19 after eval and visit 1). Spouse agrees to leave 4 visits prior to end of year. See auth visits    Authorization - Visit Number  22    Authorization - Number of Visits  26    SLP Start Time  0093   SLP Stop Time   8182   SLP Time Calculation (min)  42 min    Activity Tolerance  Patient tolerated treatment well       History reviewed. No pertinent past medical history.  History reviewed. No pertinent surgical history.  There were no vitals filed for this visit.  Subjective Assessment - 01/24/18 1755    Subjective  Pt brought Lingraphica today,    Patient is accompained by:  Family member [name], wife            ADULT SLP TREATMENT - 01/24/18 1755      General Information   Behavior/Cognition  Alert;Cooperative;Pleasant mood      Treatment Provided   Treatment provided  Cognitive-Linquistic      Cognitive-Linquistic Treatment   Treatment focused on  Aphasia;Apraxia    Skilled Treatment  SLP spent session today working with pt using icons in "scavenger hunt" format. Pt was 75% successful at drilling down 2 pages to find item described by SLP. Pt req'd min-mod A rarely due to reduced auditory comprehension.       Assessment / Recommendations / Plan   Plan  Continue with current plan of care      Progression Toward Goals   Progression toward goals  Progressing toward goals             SLP Short Term Goals -  01/09/18 1604      SLP SHORT TERM GOAL #1   Title  Pt will ID object f:5 to sentence description with 90% accuracy and occasional min A    Time  1    Period  Weeks    Status  Not Met      SLP SHORT TERM GOAL #2   Title  Pt will complete automatic speech tasks with 70% accuracy and visual cues over 3 sessions    Time  1    Period  Weeks    Status  Not Met      SLP SHORT TERM GOAL #3   Title  Pt will utilize multimodal communication and AAC to communicate wants and answer questions with 70% accuracy and occasional min A over 3 sessions    Time  1    Period  Weeks    Status  Not Met      SLP SHORT TERM GOAL #4   Title  Pt will write/type at word level with 80% accuracy and occasional min A over 2 sessions    Time  1    Period  Weeks    Status  Not Met       SLP Long Term  Goals - 01/24/18 1758      SLP LONG TERM GOAL #1   Title  Pt will comprehend mildly complex conversation over 8 minute conversation with appropriate responses (mulitmodal) and 2 or less requests for repetition    Baseline  01/09/18    Status  Achieved      SLP LONG TERM GOAL #2   Title  Pt will utilize multimodal communication/ACC to produce 2 word response to conversation/question 8/10x with occasional min A    Time  2    Period  Weeks    Status  On-going      SLP LONG TERM GOAL #3   Title  Pt will write 1 word  with 70% accuracy and occasional min A    Time  3    Period  Weeks    Status  Revised      SLP LONG TERM GOAL #4   Title  Pt will produce 1-2 syllable words in phrase completion task with 70% accuracy and occassional mod A    Baseline  01/04/18    Time  2    Period  Weeks    Status  On-going       Plan - 01/24/18 1758    Clinical Impression Statement  Severe non fluent Broca's aphasia persists. SLP focused today's session on basic use of Lingraphica to make and delete icons. See "skiled intervention" for more details. Continue skilled ST to maximize communication.     Speech Therapy  Frequency  2x / week    Duration  -- 8 weeks or 17 total sessions    Treatment/Interventions  Language facilitation;Environmental controls;Cueing hierarchy;Oral motor exercises;SLP instruction and feedback;Compensatory strategies;Functional tasks;Cognitive reorganization;Internal/external aids;Multimodal communcation approach;Patient/family education;Other (comment)    Potential to Achieve Goals  Good       Patient will benefit from skilled therapeutic intervention in order to improve the following deficits and impairments:   Aphasia  Verbal apraxia    Problem List There are no active problems to display for this patient.   [facility] ,[place], [place]  01/24/2018, 5:58 PM  [facility] [address] [place], [place], [address] Phone: [phone]   Fax:  [phone]   Name: Tim Stephens MRN: [id number] Date of Birth: Nov 20, 1955

## 2018-01-24 NOTE — Therapy (Signed)
[facility] [address] [place], [place], [address] Phone: [phone]   Fax:  [phone]  Physical Therapy Treatment  Patient Details  Name: Tim Stephens MRN: [id number] Date of Birth: Feb 01, 1956 Referring Provider: Scarlette Ar, MD   Encounter Date: 01/23/2018  PT End of Session - 01/24/18 2145    Visit Number  11    Number of Visits  33 Pt has been approved for 16 at this time    Date for PT Re-Evaluation  01/28/18    Authorization Type  [employer]- initial 8 visits approved/can ask for more    Authorization Time Period  12/13/17-02/12/18 (2nd set of 8 visits approved)    Authorization - Visit Number  11    Authorization - Number of Visits  16    PT Start Time  4765    PT Stop Time  1445    PT Time Calculation (min)  41 min    Equipment Utilized During Treatment  Gait belt    Activity Tolerance  Patient tolerated treatment well low blood pressure upon standing    Behavior During Therapy  [facility] for tasks assessed/performed       No past medical history on file.  No past surgical history on file.  Vitals:   01/23/18 1414 01/23/18 1422 01/23/18 1431 01/23/18 1435  BP: 93/73 107/84 113/86 (!) 108/92  Pulse: 95 86 90     Supine   Sitting   Sitting   standing  Subjective Assessment - 01/24/18 2135    Subjective  Per OT report prior to PT session, pt's BP had been dropping with position changes.    Patient is accompained by:  Family member wife    Pertinent History  Recently, pt on hold for PT due to medical issues and having skull flap surgery.  Tim Stephens is a 62 year old right hand dominant male with past medical history significant for hypertension and hyperlipidemia who was admitted to [facility] on 05/30/2017 with a aphasia and right hemibody weakness. CTA was obtained and revealed left M1 cutoff. He was taken to IR by Dr. Bridgette Habermann for cerebral angiography with mechanical thrombectomy. The patient's acute hospital course  was complicated by worsening cytotoxic edema as well areas of hemorrhagic conversion with left to right midline shift. He was taken to the OR on 06/01/2017 for decompressive left hemicraniectomy. [name] He was found to have a right peroneal, right posterior tibial, right gastrocnemius,left basilic, and left axillary venous thrombi. He then developed totally occluding superficial thrombophlebitis of the left cephalic vein and right cephalic vein. Venous thrombus then progressed in the left upper arm to the left brachial vein. The patient was not anticoagulated due to recent hemorrhagic conversion of stroke. He underwent serial scans to evaluate for progression of venous thrombosis. PEG was placed by Dr. [name] on 06/15/2017. There was progression of right lower extremity DVT from the posterior tibial vein to the right common femoral vein. Vascular surgery was consulted and placed an inferior vena cava filter on 06/18/2017. expressive aphasia, verbal apraxia; therapy at [facility] January 2019, then [facility] rehab for 4 weeks, then [facility] therapy PT, OT, speech discharged 09/21/17    Patient Stated Goals  Per wife:  to get strong enough to get upstairs, and use the bathroom on his own; trying to be more mobile outside the home-fishing, more recreational things    Currently in Pain?  No/denies  [facility] Adult PT Treatment/Exercise - 01/24/18 0001      Bed Mobility   Bed Mobility  Supine to Sit    Sit to Supine  Minimal Assistance - Patient > 75% Cues for hand placement      Transfers   Transfers  Sit to Stand;Stand to Sit    Sit to Stand  4: Min guard    Stand to Sit  4: Min guard    Stand Pivot Transfers  4: Min assist    Stand Pivot Transfer Details (indicate cue type and reason)  Mat to wheelchair    Comments  Pt able to stand at counter for 3 minutes with LUE support at counter with min guard assistance.  Standing activities include gentle minisquats to  return to stand for terminal knee extension, lateral weightshifting 2 sets x 10 reps.  Standing with head turns R and L x 5 reps      Knee/Hip Exercises: Seated   Long Arc Quad  AAROM;Strengthening;Right;1 set;5 reps    Other Seated Knee/Hip Exercises  Seated edge of mat with gentle knee flexion/extension 2 sets x 10 reps.  Seated ankle pumps, gentle ROM, 2 sets x 10 reps    Marching  AROM;AAROM;Right;2 sets;5 reps Attempted, but pt tends to hold breath.      Knee/Hip Exercises: Supine   Straight Leg Raises  Strengthening;Right;1 set;5 reps with cues to maintain terminal knee extension    Other Supine Knee/Hip Exercises  Hooklying marching RLE for hip/knee flexion, with therapist assist only for guidance and cues for breathing.         Cues throughout session for relaxed, normal breathing pattern during transitional movements and during gentle lower extremity exercises.  Educated pt/wife on how gentle lower extremity ROM exercises in sitting can assist in circulation/blood flow prior to standing to try to offset such significant blood pressure fluctuations.      PT Education - 01/24/18 2143    Education provided  Yes    Education Details  Educated in need to be upright in wheelchair or other chair in the house most of the day, to be out of bed during the day in order to help decrease orthostatic hypotension symptoms, slow transitions between positions, gentle lower extremity exercises prior to standing    Person(s) Educated  Patient;Spouse    Methods  Explanation;Demonstration;Verbal cues    Comprehension  Verbalized understanding;Returned demonstration       PT Short Term Goals - 01/18/18 2145      PT SHORT TERM GOAL #1   Title  Pt will perform HEP with wife's supervision for improved balance, strength, gait.  TARGET 12/29/17 (STGS to be fully assessed week of 01/16/18, due to pt missing several weeks of therapy)    Baseline  Ongoing, with pt/wife instructed in HEP 12/07/17 and 12/12/17  visits    Time  4    Period  Weeks    Status  On-going      PT SHORT TERM GOAL #2   Title  Pt will perform sit to stand with supervision, for improved transfer efficiency and safety with gait.    Baseline  min assist>min guard assist 12/12/17    Time  4    Period  Weeks    Status  Partially Met      PT SHORT TERM GOAL #3   Title  Pt will ambulate at least 100 ft using hemiwalker with minimal assistance for improved gait safety and household mobility.    Baseline  ongoing, 50 ft on 12/12/17 visit ; met on 7/11    Time  4    Period  Weeks    Status  Partially Met      PT SHORT TERM GOAL #4   Title  Stand pivot transfer to be assessed, with pt performing stand pivot transfer with minimal assistance, for improved participation in transfers at home.    Baseline  mod>min assist for SPT 12/12/17; met on 7/11    Time  4    Period  Weeks    Status  Partially Met      PT SHORT TERM GOAL #5   Title  Pt/wife will verbalize understanding of fall prevention in home environment.    Time  4    Period  Weeks    Status  On-going      PT SHORT TERM GOAL #6   Title  Berg score to increase by at least 8 points, for improved standing balance and decreased fall risk.    Time  4    Period  Weeks    Status  On-going      PT SHORT TERM GOAL #7   Title  Pt will tolerate treatment of L BPPV with canalith repositioning maneuver    Baseline  met on 7/8 - BPPV cleared     Status  Achieved        PT Long Term Goals - 01/09/18 1517      PT LONG TERM GOAL #1   Title  Pt/wife will verbalize plans for continued community fitness upon d/c from PT.  TARGET 01/28/18    Time  8    Period  Weeks    Status  On-going      PT LONG TERM GOAL #2   Title  Pt will perform sit to stand transfers modified independently, for improved safety and independence with transfers.    Time  8    Period  Weeks    Status  On-going      PT LONG TERM GOAL #3   Title  Pt will ambulate at least 300 ft using least restrictive  assistive device, with supervision, for improved safety and independence with gait.    Time  8    Period  Weeks    Status  Revised      PT LONG TERM GOAL #4   Title  GAit velocity to be assessed, with goal to be set as appropriate.    Time  8    Period  Weeks    Status  On-going      PT LONG TERM GOAL #5   Title  Pt will negotiate 12 steps using handrail, with supervision, for improved safety and independence with stair negotiation for home.    Time  8    Period  Weeks    Status  On-going      Additional Long Term Goals   Additional Long Term Goals  Yes      PT LONG TERM GOAL #6   Title  Pt will report 0/10 spinning/dizziness with rolling in bed and supine <> sit due to resolution of L BPPV    Status  New    Target Date  01/28/18            Plan - 01/24/18 2146    Clinical Impression Statement  Pt able to tolerate full session today, though upon initiation of session, OT reports pt having orthostatic symptoms in OT session today.  Addressed position changes  slowly, with prolonged time in sitting with gentle lower extremity exercises, allowing blood pressure to increase prior to standing.  Pt able to stand for several minutes at counter with standing activities.  Educated patient and wife in need for patient to be out of bed at home, sitting up in chair and trying short bouts of standing as part of his home program to assist with continued strengthening and standing/gait tolerance.    Rehab Potential  Good    Clinical Impairments Affecting Rehab Potential  Good family support; severity of deficits    PT Frequency  Other (comment) 2x/week but week of 7/8 3 visits to treat BPPV    PT Duration  8 weeks    PT Treatment/Interventions  ADLs/Self Care Home Management;DME Instruction;Gait training;Stair training;Functional mobility training;Therapeutic activities;Therapeutic exercise;Balance training;Orthotic Fit/Training;Patient/family education;Neuromuscular re-education;Canalith  Repostioning;Vestibular    PT Next Visit Plan  CHECK BP AND MONITOR FOR ORTHOSTASIS.  Need to get order for AFO consult and clearance for [employer] (will also have to add to POC -wait until recertify on 9/44??).  Continue gait training with hemiwalker and trial PLS.  Stair negotiation.  NMR R LE.  Trunk/postural control    Consulted and Agree with Plan of Care  Patient;Family member/caregiver    Family Member Consulted  wife       Patient will benefit from skilled therapeutic intervention in order to improve the following deficits and impairments:  Abnormal gait, Decreased activity tolerance, Decreased balance, Decreased knowledge of precautions, Decreased endurance, Decreased knowledge of use of DME, Decreased mobility, Decreased safety awareness, Difficulty walking, Decreased strength, Impaired tone, Postural dysfunction, Dizziness  Visit Diagnosis: Muscle weakness (generalized)  Unsteadiness on feet     Problem List There are no active problems to display for this patient.   Britne Borelli W. 01/24/2018, 9:55 PM  Frazier Butt., PT   [facility] [address] [place], [place], [address] Phone: [phone]   Fax:  [phone]  Name: HIRO VIPOND MRN: [id number] Date of Birth: 08-19-1955

## 2018-01-24 NOTE — Therapy (Addendum)
[facility] [address] [place], [place], [address] Phone: [phone]   Fax:  [phone]  Speech Language Pathology Treatment  Patient Details  Name: Tim Stephens MRN: [id number] Date of Birth: [birth year] Referring Provider: Dr. Scarlette Ar   Encounter Date: 01/24/2018  End of Session - 01/24/18 1746    Visit Number  14    Number of Visits  21    Date for SLP Re-Evaluation  03/10/18    Authorization - Visit Number  23    Authorization - Number of Visits  26    SLP Start Time  1020    SLP Stop Time   1101    SLP Time Calculation (min)  41 min    Activity Tolerance  Patient tolerated treatment well       History reviewed. No pertinent past medical history.  History reviewed. No pertinent surgical history.  There were no vitals filed for this visit.  Subjective Assessment - 01/24/18 1024    Subjective  Pt brought Lingraphica today. "We got home later last night because I had to go govery shopping"    Patient is accompained by:  Family member wife - [name]    Currently in Pain?  No/denies            ADULT SLP TREATMENT - 01/24/18 1025      General Information   Behavior/Cognition  Alert;Cooperative;Pleasant mood      Treatment Provided   Treatment provided  Cognitive-Linquistic      Cognitive-Linquistic Treatment   Treatment focused on  Aphasia;Apraxia    Skilled Treatment  Due to "s" statement, SLP worked with pt to add family pictures. Pt was independent with all steps except spelling names - pt produced first letter of names with 2/5 success. SLP educated pt/wife how to add an icon to a different page with a current picture. Pt repeated family name after accessing icon for that picture/person 20% of the time.      Assessment / Recommendations / Plan   Plan  Continue with current plan of care      Progression Toward Goals   Progression toward goals  Progressing toward goals       SLP  Education - 01/24/18 1746    Education provided  Yes    Education Details  adding pictures- making icons from photos taken with Lingraphica    Person(s) Educated  Patient;Spouse    Methods  Explanation;Demonstration;Verbal cues    Comprehension  Verbal cues required;Returned demonstration;Verbalized understanding;Need further instruction       SLP Short Term Goals - 01/09/18 1604      SLP SHORT TERM GOAL #1   Title  Pt will ID object f:5 to sentence description with 90% accuracy and occasional min A    Time  1    Period  Weeks    Status  Not Met      SLP SHORT TERM GOAL #2   Title  Pt will complete automatic speech tasks with 70% accuracy and visual cues over 3 sessions    Time  1    Period  Weeks    Status  Not Met      SLP SHORT TERM GOAL #3   Title  Pt will utilize multimodal communication and AAC to communicate wants and answer questions with 70% accuracy and occasional min A over 3 sessions    Time  1    Period  Weeks    Status  Not  Met      SLP SHORT TERM GOAL #4   Title  Pt will write/type at word level with 80% accuracy and occasional min A over 2 sessions    Time  1    Period  Weeks    Status  Not Met       SLP Long Term Goals - 01/24/18 1748      SLP LONG TERM GOAL #1   Title  Pt will comprehend mildly complex conversation over 8 minute conversation with appropriate responses (mulitmodal) and 2 or less requests for repetition    Baseline  01/09/18    Status  Achieved      SLP LONG TERM GOAL #2   Title  Pt will utilize multimodal communication/ACC to produce 2 word response to conversation/question 8/10x with occasional min A    Time  4    Period  Weeks    Status  On-going      SLP LONG TERM GOAL #3   Title  Pt will write 1 word  with 70% accuracy and occasional min A    Time  4    Period  Weeks    Status  On-going      SLP LONG TERM GOAL #4   Title  Pt will produce 1-2 syllable words in phrase completion task with 70% accuracy and occassional mod A     Baseline  01/04/18    Time  4    Period  Weeks    Status  On-going      SLP LONG TERM GOAL #5   Title  --    Time  --    Period  --    Status  --       Plan - 01/24/18 1748    Clinical Impression Statement  Severe non fluent Broca's aphasia persists. SLP focused today's session on basic use of Lingraphica to make icons. See "skiled intervention" for more details. I recommend continue skilled ST to maximize communication using pt's Lingraphica.     Speech Therapy Frequency  2x / week    Duration  4 weeks 8 weeks or 17 total sessions    Treatment/Interventions  Language facilitation;Environmental controls;Cueing hierarchy;Oral motor exercises;SLP instruction and feedback;Compensatory strategies;Functional tasks;Cognitive reorganization;Internal/external aids;Multimodal communcation approach;Patient/family education;Other (comment)    Potential to Achieve Goals  Good       Patient will benefit from skilled therapeutic intervention in order to improve the following deficits and impairments:   Aphasia  Verbal apraxia    Problem List There are no active problems to display for this patient.   [facility] ,[place], [place]  01/24/2018, 5:50 PM  [facility] [address] [place], [place], [address] Phone: [phone]   Fax:  [phone]   Name: Tim Stephens MRN: [id number] Date of Birth: 1955/07/27

## 2018-01-24 NOTE — Therapy (Signed)
[facility] [address] [place], [place], [address] Phone: [phone]   Fax:  [phone]  Occupational Therapy Treatment  Patient Details  Name: Tim Stephens MRN: [id number] Date of Birth: 1956-03-23 Referring Provider: Jenita Seashore, MD   Encounter Date: 01/24/2018  OT End of Session - 01/24/18 1302    Visit Number  11    Number of Visits  22    Date for OT Re-Evaluation  02/27/18    Authorization Type  BC[employer]    Authorization Time Period  pt has been approved for additional 8 visits by 02/12/2018 (total of 16)    Authorization - Visit Number  11    Authorization - Number of Visits  16    OT Start Time  1145    OT Stop Time  1230    OT Time Calculation (min)  45 min    Activity Tolerance  Patient tolerated treatment well    Behavior During Therapy  [facility] for tasks assessed/performed       History reviewed. No pertinent past medical history.  History reviewed. No pertinent surgical history.  Vitals:   01/24/18 1251 01/24/18 1300  BP: (!) 118/97 (!) 118/91    Subjective Assessment - 01/24/18 1251    Subjective   Good    Patient is accompained by:  Family member    Pertinent History  MONITOR BP!!!  05/30/2017 admitted to [facility] with LMI cutoff, s/p thromectomy with resultant L CVA due mulitple thromboses;  IVC filter with Coumadin, 06/01/2017 craniotomy    Patient Stated Goals  unable to state due to aphasia    Currently in Pain?  No/denies    Pain Score  0-No pain                   OT Treatments/Exercises (OP) - 01/24/18 0001      Neurological Re-education Exercises   Other Exercises 1  Neuromuscular reeducation to address distal RUE function.  Long arm weight bearing followed with active pro/supination, trace wrist extension.  Worked to combine forward reach with pinch, grasp, and release.  Patient able to use isolated shoulder flexion to approach objects presented at table height in sitting.   Patient able to incoporate functional use of pronation as well as passive release of objects in hand;  turning page, picking up and stacking 1" block, puling out a tissue from box, turning over large playing cards.  BP stab;le throughout session!               OT Education - 01/24/18 1300    Education provided  Yes    Education Details  decreased use of body motion to move right arm    Person(s) Educated  Patient;Spouse    Methods  Explanation;Demonstration    Comprehension  Need further instruction       OT Short Term Goals - 01/23/18 1506      OT SHORT TERM GOAL #1   Title  Pt and wife will be mod I with HEP for RUE ROM - 01/30/2018 (renewed)    Status  On-going      OT SHORT TERM GOAL #2   Title  Pt will be min a for grooming with mod cues from wife and intermittent set up prn    Status  Achieved      OT SHORT TERM GOAL #3   Title  Pt will be mod a for UB bathing at wheelchair level with mod cues  Status  Achieved      OT SHORT TERM GOAL #4   Title  Pt will be mod a for LB bathing at sit to stand level  with mod cues and set up    Status  Achieved      OT SHORT TERM GOAL #5   Title  Pt will be max a for UB dressing at wheelchair level, mod cues    Status  Achieved      OT SHORT TERM GOAL #6   Title  Pt and wife will explore options for 3 in 1 commode for pt to use in bathroom or at bedside    Status  Achieved      OT SHORT TERM GOAL #7   Title  Pt will use RUE as stabilizer 25% of the time during basic self care skills with min a and cues.     Status  On-going        OT Long Term Goals - 01/23/18 1506      OT LONG TERM GOAL #1   Title  Pt and wife will be mod I with home activity program designed to increase independence in basic self care as well as address cognition - 02/27/2018 (renewed)    Status  On-going      OT LONG TERM GOAL #2   Title  Pt will be min a for UB bathing after set up with mod cues    Status  Achieved      OT LONG TERM GOAL #3    Title  Pt will be min a for UB dressing with mod cues    Status  Achieved      OT LONG TERM GOAL #4   Title  Pt will be mod a for LB bathing with mod cues    Status  On-going      OT LONG TERM GOAL #5   Title  Pt will be mod a for LB dressing with mod cues    Status  On-going      OT LONG TERM GOAL #6   Title  Pt will be min a for toilet transfers in bathroom (able to ambulate 3 steps into bathroom with wife).      Status  On-going      OT LONG TERM GOAL #7   Title  Pt will be mod a for clothing mmgt with toileting    Status  On-going      OT LONG TERM GOAL #8   Title  Pt will demonstrate ability to use RUE as stabilizer during basic self care 50% of the time with min a and cues.     Status  On-going            Plan - 01/24/18 1302    Clinical Impression Statement  Pt with slow progress toward goals. Pt has active isolated movement in RUE however very difficult for pt to initiate movement due to severe sensory loss, cognitve deficits.      Occupational Profile and client history currently impacting functional performance  HLD, HTN, s/p craniotomy, IVC filter with Coumadin, seizures.    Occupational performance deficits (Please refer to evaluation for details):  ADL's;IADL's;Rest and Sleep;Work;Leisure;Social Participation    Rehab Potential  Good    Current Impairments/barriers affecting progress:  severity of deficits    OT Frequency  2x / week    OT Duration  8 weeks    OT Treatment/Interventions  Self-care/ADL training;Moist Heat;Therapeutic exercise;Neuromuscular education;DME and/or AE instruction;Manual  Therapy;Functional Mobility Training;Passive range of motion;Therapeutic activities;Splinting;Patient/family education;Balance training    Plan  NMR trunk/RUE, sit to stand, stand to sit, balance, functional mobility, possible use of Give Mohr sling for ambulation    Clinical Decision Making  Multiple treatment options, significant modification of task necessary     Consulted and Agree with Plan of Care  Patient;Family member/caregiver    Family Member Consulted  [name]       Patient will benefit from skilled therapeutic intervention in order to improve the following deficits and impairments:  Abnormal gait, Decreased activity tolerance, Decreased balance, Decreased cognition, Decreased knowledge of use of DME, Decreased mobility, Decreased range of motion, Decreased safety awareness, Difficulty walking, Decreased strength, Impaired UE functional use, Impaired tone, Impaired sensation, Pain  Visit Diagnosis: Hemiplegia and hemiparesis following cerebral infarction affecting right dominant side (HCC)  Unsteadiness on feet  Muscle weakness (generalized)  Abnormal posture  Apraxia  Other symptoms and signs involving the nervous system  Other symptoms and signs involving cognitive functions following cerebral infarction    Problem List There are no active problems to display for this patient.   Collier Salina, OTR/L 01/24/2018, 1:06 PM  [facility] [address] [place], [place], [address] Phone: [phone]   Fax:  [phone]  Name: Tim Stephens MRN: [id number] Date of Birth: 02/16/1956

## 2018-01-25 ENCOUNTER — Encounter: Payer: Self-pay | Admitting: Physical Therapy

## 2018-01-25 NOTE — Therapy (Signed)
[facility] [address] [place], [place], [address] Phone: [phone]   Fax:  [phone]  Physical Therapy Treatment  Patient Details  Name: Tim Stephens MRN: [id number] Date of Birth: 07/15/1955 Referring Provider: Scarlette Ar, MD   Encounter Date: 01/24/2018  PT End of Session - 01/25/18 1739    Visit Number  12    Number of Visits  71 Pt has been approved for 16 at this time    Date for PT Re-Evaluation  01/28/18    Authorization Type  BCBS- initial 8 visits approved/can ask for more    Authorization Time Period  12/13/17-02/12/18 (2nd set of 8 visits approved)    Authorization - Visit Number  12    Authorization - Number of Visits  16    PT Start Time  1104    PT Stop Time  1145    PT Time Calculation (min)  41 min    Equipment Utilized During Treatment  Gait belt    Activity Tolerance  Patient tolerated treatment well    Behavior During Therapy  [facility] for tasks assessed/performed       History reviewed. No pertinent past medical history.  History reviewed. No pertinent surgical history.  There were no vitals filed for this visit.  Subjective Assessment - 01/25/18 1734    Subjective  Per wife, did not have BP medication at home because his BP seemed just right.    Patient is accompained by:  Family member wife    Pertinent History  Recently, pt on hold for PT due to medical issues and having skull flap surgery.  Tim Stephens is a 62 year old right hand dominant male with past medical history significant for hypertension and hyperlipidemia who was admitted to [facility] on 05/30/2017 with a aphasia and right hemibody weakness. CTA was obtained and revealed left M1 cutoff. He was taken to IR by Dr. Bridgette Habermann for cerebral angiography with mechanical thrombectomy. The patient's acute hospital course was complicated by worsening cytotoxic edema as well areas of hemorrhagic conversion with left to right midline shift. He was  taken to the OR on 06/01/2017 for decompressive left hemicraniectomy. [name] He was found to have a right peroneal, right posterior tibial, right gastrocnemius,left basilic, and left axillary venous thrombi. He then developed totally occluding superficial thrombophlebitis of the left cephalic vein and right cephalic vein. Venous thrombus then progressed in the left upper arm to the left brachial vein. The patient was not anticoagulated due to recent hemorrhagic conversion of stroke. He underwent serial scans to evaluate for progression of venous thrombosis. PEG was placed by Dr. [name] on 06/15/2017. There was progression of right lower extremity DVT from the posterior tibial vein to the right common femoral vein. Vascular surgery was consulted and placed an inferior vena cava filter on 06/18/2017. expressive aphasia, verbal apraxia; therapy at [facility] January 2019, then [facility] rehab for 4 weeks, then [facility] therapy PT, OT, speech discharged 09/21/17    Patient Stated Goals  Per wife:  to get strong enough to get upstairs, and use the bathroom on his own; trying to be more mobile outside the home-fishing, more recreational things    Currently in Pain?  No/denies                       [facility] Adult PT Treatment/Exercise - 01/25/18 0001      Transfers   Transfers  Sit to Stand;Stand to Sit  Sit to Stand  4: Min guard    Sit to Stand Details  -- Cues for hand placement    Number of Reps  -- at least 5 times throughout PT session      Ambulation/Gait   Ambulation/Gait  Yes    Ambulation/Gait Assistance  4: Min assist    Ambulation Distance (Feet)  100 Feet then additional gait in parallel bars-see below    Assistive device  Hemi-walker;Parallel bars;Other (Comment)    Gait Pattern  -- trial PLS AFO, with/without heel wedge; blue rocker AFO,     Ambulation Surface  Level;Indoor    Gait Comments  Trial of PLS AFO in parallel bars 10 ft x 4 reps, then trial PLS AFO 10 ft x 2   reps with heel wedge (with both, pt has R knee recurvatum); trial blue rocker AFO 10 ft x 4 reps in parallel bars, decr. foot clearance and recurvatum noted.      Knee/Hip Exercises: Standing   Other Standing Knee Exercises  Attempted terminal knee extension RLE, but pt performs gentle minisquats, with cues for full quad activation upon standing.      Knee/Hip Exercises: Seated   Other Seated Knee/Hip Exercises  Seated edge of mat with gentle knee flexion/extension 2 sets x 10 reps.  Seated ankle pumps, gentle ROM, 2 sets x 10 reps      Brief seated breaks in between gait to change AFO trials.  Prior to standing and gait activities:  Seated BP:  120/93.  Standing BP:  115/94.  No symptoms of fatigue/lightheadedness reported in session today.       PT Education - 01/24/18 2143    Education provided  Yes    Education Details  Educated in need to be upright in wheelchair or other chair in the house most of the day, to be out of bed during the day in order to help decrease orthostatic hypotension symptoms, slow transitions between positions, gentle lower extremity exercises prior to standing    Person(s) Educated  Patient;Spouse    Methods  Explanation;Demonstration;Verbal cues    Comprehension  Verbalized understanding;Returned demonstration       PT Short Term Goals - 01/25/18 1755      PT SHORT TERM GOAL #1   Title  Pt will perform HEP with wife's supervision for improved balance, strength, gait.  TARGET 12/29/17 (STGS to be fully assessed week of 01/16/18, due to pt missing several weeks of therapy)    Baseline  Ongoing, with pt/wife instructed in HEP 12/07/17 and 12/12/17 visits    Time  4    Period  Weeks    Status  Achieved      PT SHORT TERM GOAL #2   Title  Pt will perform sit to stand with supervision, for improved transfer efficiency and safety with gait.    Baseline  min assist>min guard assist 12/12/17    Time  4    Period  Weeks    Status  Partially Met      PT SHORT TERM  GOAL #3   Title  Pt will ambulate at least 100 ft using hemiwalker with minimal assistance for improved gait safety and household mobility.    Baseline  ongoing, 50 ft on 12/12/17 visit ; met on 7/11    Time  4    Period  Weeks    Status  Partially Met      PT SHORT TERM GOAL #4   Title  Stand pivot transfer  to be assessed, with pt performing stand pivot transfer with minimal assistance, for improved participation in transfers at home.    Baseline  mod>min assist for SPT 12/12/17; met on 7/11    Time  4    Period  Weeks    Status  Partially Met      PT SHORT TERM GOAL #5   Title  Pt/wife will verbalize understanding of fall prevention in home environment.    Time  4    Period  Weeks    Status  On-going      PT SHORT TERM GOAL #6   Title  Berg score to increase by at least 8 points, for improved standing balance and decreased fall risk.    Baseline  Have not been able to formally assess static standing balance due to blood pressure fluctuations     Time  4    Period  Weeks    Status  Deferred      PT SHORT TERM GOAL #7   Title  Pt will tolerate treatment of L BPPV with canalith repositioning maneuver    Baseline  met on 7/8 - BPPV cleared     Status  Achieved        PT Long Term Goals - 01/25/18 1740      PT LONG TERM GOAL #1   Title  Pt/wife will verbalize plans for continued community fitness upon d/c from PT.  TARGET 01/28/18    Time  8    Period  Weeks    Status  On-going      PT LONG TERM GOAL #2   Title  Pt will perform sit to stand transfers modified independently, for improved safety and independence with transfers.    Baseline  Min guard assist 01/24/18    Time  8    Period  Weeks    Status  On-going      PT LONG TERM GOAL #3   Title  Pt will ambulate at least 300 ft using least restrictive assistive device, with supervision, for improved safety and independence with gait.    Baseline  100 ft min assist 01/24/18    Time  8    Period  Weeks    Status  On-going       PT LONG TERM GOAL #4   Title  GAit velocity to be assessed, with goal to be set as appropriate.    Time  8    Period  Weeks    Status  On-going      PT LONG TERM GOAL #5   Title  Pt will negotiate 12 steps using handrail, with supervision, for improved safety and independence with stair negotiation for home.    Time  8    Period  Weeks    Status  On-going      PT LONG TERM GOAL #6   Title  Pt will report 0/10 spinning/dizziness with rolling in bed and supine <> sit due to resolution of L BPPV    Status  New            Plan - 01/25/18 1747    Clinical Impression Statement  Pt again able to tolerate full session today of seated>standing exercises and gait training in parallel bars and in gym with hemiwalker.  He has not yet met LTG 1 and remaining LTGs are ongoing as well.  He is progressing towards LTGs, but progress has been slowed due to blood pressure fluctuations limiting mobility in  therapy; in addition, pt has has vertigo, which is now cleared with no additional symptoms x >1 week.  Pt remains motivated for therapy and continues to benefit from skilled PT to further address baland and gait.  Please see updated goals and recert.    Rehab Potential  Good    Clinical Impairments Affecting Rehab Potential  Good family support; severity of deficits    PT Frequency  Other (comment) 2x/week but week of 7/8 3 visits to treat BPPV    PT Duration  8 weeks    PT Treatment/Interventions  ADLs/Self Care Home Management;DME Instruction;Gait training;Stair training;Functional mobility training;Therapeutic activities;Therapeutic exercise;Balance training;Orthotic Fit/Training;Patient/family education;Neuromuscular re-education;Canalith Repostioning;Vestibular;Electrical Stimulation    PT Next Visit Plan  CHECK BP AND MONITOR FOR ORTHOSTASIS.  Need to get order for AFO consult and clearance for [place] (have requested and awaiting MD signature); gait training, stair negotiation as able; check  gait velocity and Berg when able.    Consulted and Agree with Plan of Care  Patient;Family member/caregiver    Family Member Consulted  wife       Patient will benefit from skilled therapeutic intervention in order to improve the following deficits and impairments:  Abnormal gait, Decreased activity tolerance, Decreased balance, Decreased knowledge of precautions, Decreased endurance, Decreased knowledge of use of DME, Decreased mobility, Decreased safety awareness, Difficulty walking, Decreased strength, Impaired tone, Postural dysfunction, Dizziness  Visit Diagnosis: Unsteadiness on feet  Muscle weakness (generalized)  Abnormal posture  Other abnormalities of gait and mobility  Other symptoms and signs involving the nervous system  Dizziness and giddiness     Problem List There are no active problems to display for this patient.   Raye Slyter W. 01/25/2018, 5:56 PM  Frazier Butt., PT   [facility] [address] [place], [place], [address] Phone: [phone]   Fax:  [phone]  Name: Tim Stephens MRN: [id number] Date of Birth: Jan 07, 1956

## 2018-02-01 ENCOUNTER — Ambulatory Visit: Payer: BLUE CROSS/BLUE SHIELD | Admitting: Occupational Therapy

## 2018-02-01 ENCOUNTER — Ambulatory Visit: Payer: BLUE CROSS/BLUE SHIELD

## 2018-02-01 ENCOUNTER — Ambulatory Visit: Payer: BLUE CROSS/BLUE SHIELD | Admitting: Physical Therapy

## 2018-02-01 ENCOUNTER — Encounter: Payer: Self-pay | Admitting: Occupational Therapy

## 2018-02-01 VITALS — BP 117/92

## 2018-02-01 DIAGNOSIS — M6281 Muscle weakness (generalized): Secondary | ICD-10-CM

## 2018-02-01 DIAGNOSIS — R2681 Unsteadiness on feet: Secondary | ICD-10-CM

## 2018-02-01 DIAGNOSIS — R29818 Other symptoms and signs involving the nervous system: Secondary | ICD-10-CM

## 2018-02-01 DIAGNOSIS — R482 Apraxia: Secondary | ICD-10-CM

## 2018-02-01 DIAGNOSIS — R2689 Other abnormalities of gait and mobility: Secondary | ICD-10-CM

## 2018-02-01 DIAGNOSIS — R4701 Aphasia: Secondary | ICD-10-CM

## 2018-02-01 DIAGNOSIS — R293 Abnormal posture: Secondary | ICD-10-CM

## 2018-02-01 DIAGNOSIS — I69351 Hemiplegia and hemiparesis following cerebral infarction affecting right dominant side: Secondary | ICD-10-CM

## 2018-02-01 DIAGNOSIS — I69318 Other symptoms and signs involving cognitive functions following cerebral infarction: Secondary | ICD-10-CM

## 2018-02-01 NOTE — Patient Instructions (Signed)
  Please complete the assigned speech therapy homework prior to your next session.  

## 2018-02-01 NOTE — Patient Instructions (Signed)
  Stand in front of counter/sink with lower cabinet doors open.  Place right foot on lowest shelf.  Stand tall on Left leg and raise L hand for 5-10 seconds balancing on Left leg.  Repeat 2-3 times.  DO NOT perform on Right leg for now.      Setup  Begin in a standing upright position with your hands resting on a counter in front of you. Movement  Step sideways along the length of the counter. When you reach the end of the counter, side step in the opposite direction back to the starting position. Tip  Make sure to maintain an upright posture and use the counter to help you balance as needed.

## 2018-02-01 NOTE — Therapy (Signed)
[facility] [address] [place], [place], [address] Phone: [phone]   Fax:  [phone]  Occupational Therapy Treatment  Patient Details  Name: Tim Stephens MRN: [id number] Date of Birth: 08-05-1955 Referring Provider: Jenita Seashore, MD   Encounter Date: 02/01/2018  OT End of Session - 02/01/18 1605    Visit Number  12    Number of Visits  22    Date for OT Re-Evaluation  02/27/18    Authorization Type  BCBS    Authorization Time Period  pt has been approved for additional 8 visits by 02/12/2018 (total of 16)    Authorization - Visit Number  12    Authorization - Number of Visits  16    OT Start Time  1317    OT Stop Time  1400    OT Time Calculation (min)  43 min    Activity Tolerance  Patient tolerated treatment well    Behavior During Therapy  [facility] for tasks assessed/performed       History reviewed. No pertinent past medical history.  History reviewed. No pertinent surgical history.  Vitals:   02/01/18 1323  BP: (!) 117/92    Subjective Assessment - 02/01/18 1323    Subjective   +                   OT Treatments/Exercises (OP) - 02/01/18 0001      Neurological Re-education Exercises   Other Exercises 1  Neuromuscular reeducation to address right UE active movement and control, interlimb coordiantion, and isolated motor control.  Patient able to combine shoulder and elbow motion with distal end supported on UE ranger - shoulder flex/extension with elbow extension and flexion, able to add abd/adduction, and internal / external rotational components with arm weight supported.  Patient showing improved wrist extension, and isoalted forearm pro/supination.            Balance Exercises - 02/01/18 1535      Balance Exercises: Standing   Standing, One Foot on a Step  Eyes open;4 inch;2 reps;10 secs LLE only, RLE buckles    Sidestepping  Upper extremity support;2 reps at counter, UE support         OT Education - 02/01/18 1605    Education provided  Yes    Education Details  decreased use of body motion to move right arm    Person(s) Educated  Patient;Spouse    Methods  Explanation;Demonstration    Comprehension  Need further instruction       OT Short Term Goals - 01/23/18 1506      OT SHORT TERM GOAL #1   Title  Pt and wife will be mod I with HEP for RUE ROM - 01/30/2018 (renewed)    Status  On-going      OT SHORT TERM GOAL #2   Title  Pt will be min a for grooming with mod cues from wife and intermittent set up prn    Status  Achieved      OT SHORT TERM GOAL #3   Title  Pt will be mod a for UB bathing at wheelchair level with mod cues    Status  Achieved      OT SHORT TERM GOAL #4   Title  Pt will be mod a for LB bathing at sit to stand level  with mod cues and set up    Status  Achieved      OT SHORT TERM GOAL #  5   Title  Pt will be max a for UB dressing at wheelchair level, mod cues    Status  Achieved      OT SHORT TERM GOAL #6   Title  Pt and wife will explore options for 3 in 1 commode for pt to use in bathroom or at bedside    Status  Achieved      OT SHORT TERM GOAL #7   Title  Pt will use RUE as stabilizer 25% of the time during basic self care skills with min a and cues.     Status  On-going        OT Long Term Goals - 01/23/18 1506      OT LONG TERM GOAL #1   Title  Pt and wife will be mod I with home activity program designed to increase independence in basic self care as well as address cognition - 02/27/2018 (renewed)    Status  On-going      OT LONG TERM GOAL #2   Title  Pt will be min a for UB bathing after set up with mod cues    Status  Achieved      OT LONG TERM GOAL #3   Title  Pt will be min a for UB dressing with mod cues    Status  Achieved      OT LONG TERM GOAL #4   Title  Pt will be mod a for LB bathing with mod cues    Status  On-going      OT LONG TERM GOAL #5   Title  Pt will be mod a for LB dressing with mod  cues    Status  On-going      OT LONG TERM GOAL #6   Title  Pt will be min a for toilet transfers in bathroom (able to ambulate 3 steps into bathroom with wife).      Status  On-going      OT LONG TERM GOAL #7   Title  Pt will be mod a for clothing mmgt with toileting    Status  On-going      OT LONG TERM GOAL #8   Title  Pt will demonstrate ability to use RUE as stabilizer during basic self care 50% of the time with min a and cues.     Status  On-going            Plan - 02/01/18 1607    Clinical Impression Statement  Patient making slow and steady progress toward OT goals, his blood pressure appears to be more stable recently allowing him eto participate more fully in therapy.      Occupational Profile and client history currently impacting functional performance  HLD, HTN, s/p craniotomy, IVC filter with Coumadin, seizures.    Occupational performance deficits (Please refer to evaluation for details):  ADL's;IADL's;Rest and Sleep;Work;Leisure;Social Participation    Rehab Potential  Good    Current Impairments/barriers affecting progress:  severity of deficits    OT Frequency  2x / week    OT Duration  8 weeks    OT Treatment/Interventions  Self-care/ADL training;Moist Heat;Therapeutic exercise;Neuromuscular education;DME and/or AE instruction;Manual Therapy;Functional Mobility Training;Passive range of motion;Therapeutic activities;Splinting;Patient/family education;Balance training    Plan  NMR trunk/RUE, sit to stand, stand to sit, balance, functional mobility, possible use of Give Mohr sling for ambulation    Clinical Decision Making  Multiple treatment options, significant modification of task necessary    Consulted and Agree  with Plan of Care  Patient;Family member/caregiver    Family Member Consulted  [name]       Patient will benefit from skilled therapeutic intervention in order to improve the following deficits and impairments:     Visit  Diagnosis: Hemiplegia and hemiparesis following cerebral infarction affecting right dominant side (HCC)  Unsteadiness on feet  Muscle weakness (generalized)  Abnormal posture  Apraxia  Other symptoms and signs involving the nervous system  Other symptoms and signs involving cognitive functions following cerebral infarction    Problem List There are no active problems to display for this patient.   Collier SalinaGellert, Jolyne Laye M, OTR/L 02/01/2018, 4:10 PM  [facility] [address] [place], [place], [address] Phone: [phone]   Fax:  [phone]  Name: Tim BrackenJames M Stephens MRN: [id number] Date of Birth: 09/14/1955

## 2018-02-01 NOTE — Therapy (Signed)
[facility] [address] [place], [place], [address] Phone: [phone]   Fax:  [phone]  Physical Therapy Treatment  Patient Details  Name: Tim Stephens MRN: [id number] Date of Birth: June 27, 1956 Referring Provider: Jenita Seashore, MD   Encounter Date: 02/01/2018  PT End of Session - 02/01/18 1721    Visit Number  13    Number of Visits  33 Pt has been approved for 16 at this time    Date for PT Re-Evaluation  02/23/18    Authorization Type  BCBS- initial 8 visits approved/can ask for more    Authorization Time Period  12/13/17-02/12/18 (2nd set of 8 visits approved)    Authorization - Visit Number  13    Authorization - Number of Visits  16    PT Start Time  1450    PT Stop Time  1535    PT Time Calculation (min)  45 min    Equipment Utilized During Treatment  Gait belt    Activity Tolerance  Patient tolerated treatment well    Behavior During Therapy  [facility] for tasks assessed/performed       No past medical history on file.  No past surgical history on file.  There were no vitals filed for this visit.  Subjective Assessment - 02/01/18 1455    Subjective  Pt doing very well, no further dizziness.  Did have a fall on Saturday - wife turned quickly and pt tipped to R and fell on R side.  No injury.  Discussed MD decision about Bioness - No due to DVT - wife to contact MD because recent doppler showed no DVT.  Still interested in AFO.    Patient is accompained by:  Family member wife    Pertinent History  Recently, pt on hold for PT due to medical issues and having skull flap surgery.  Tim Stephens is a 62 year old right hand dominant male with past medical history significant for hypertension and hyperlipidemia who was admitted to [facility] on 05/30/2017 with a aphasia and right hemibody weakness. CTA was obtained and revealed left M1 cutoff. He was taken to IR by Dr. Lionel December for cerebral angiography with mechanical  thrombectomy. The patient's acute hospital course was complicated by worsening cytotoxic edema as well areas of hemorrhagic conversion with left to right midline shift. He was taken to the OR on 06/01/2017 for decompressive left hemicraniectomy. [name] He was found to have a right peroneal, right posterior tibial, right gastrocnemius,left basilic, and left axillary venous thrombi. He then developed totally occluding superficial thrombophlebitis of the left cephalic vein and right cephalic vein. Venous thrombus then progressed in the left upper arm to the left brachial vein. The patient was not anticoagulated due to recent hemorrhagic conversion of stroke. He underwent serial scans to evaluate for progression of venous thrombosis. PEG was placed by Dr. Allayne Butcher on 06/15/2017. There was progression of right lower extremity DVT from the posterior tibial vein to the right common femoral vein. Vascular surgery was consulted and placed an inferior vena cava filter on 06/18/2017. expressive aphasia, verbal apraxia; therapy at [facility] January 2019, then [facility] rehab for 4 weeks, then [facility] therapy PT, OT, speech discharged 09/21/17    Patient Stated Goals  Per wife:  to get strong enough to get upstairs, and use the bathroom on his own; trying to be more mobile outside the home-fishing, more recreational things    Currently in Pain?  No/denies  [facility] PT Assessment - 02/01/18 1457      Standardized Balance Assessment   Standardized Balance Assessment  Berg Balance Test;10 meter walk test    10 Meter Walk  57 seconds with hemi walker, min A - no AFO.  [name]57 ft/sec        Berg Balance Test   Sit to Stand  Able to stand  independently using hands    Standing Unsupported  Able to stand 2 minutes with supervision    Sitting with Back Unsupported but Feet Supported on Floor or Stool  Able to sit safely and securely 2 minutes    Stand to Sit  Controls descent by using hands    Transfers  Able to  transfer safely, definite need of hands    Standing Unsupported with Eyes Closed  Able to stand 10 seconds with supervision    Standing Ubsupported with Feet Together  Needs help to attain position but able to stand for 30 seconds with feet together    From Standing, Reach Forward with Outstretched Arm  Reaches forward but needs supervision    From Standing Position, Pick up Object from Floor  Able to pick up shoe, needs supervision    From Standing Position, Turn to Look Behind Over each Shoulder  Needs supervision when turning    Turn 360 Degrees  Needs assistance while turning    Standing Unsupported, Alternately Place Feet on Step/Stool  Able to complete >2 steps/needs minimal assist    Standing Unsupported, One Foot in Front  Needs help to step but can hold 15 seconds    Standing on One Leg  Unable to try or needs assist to prevent fall    Total Score  27    Berg comment:  27/56 - high falls risk         Stand in front of counter/sink with lower cabinet doors open.  Place right foot on lowest shelf.  Stand tall on Left leg and raise L hand for 5-10 seconds balancing on Left leg.  Repeat 2-3 times.  DO NOT perform on Right leg for now.      Setup  Begin in a standing upright position with your hands resting on a counter in front of you. Movement  Step sideways along the length of the counter. When you reach the end of the counter, side step in the opposite direction back to the starting position. Tip  Make sure to maintain an upright posture and use the counter to help you balance as needed.                  Balance Exercises - 02/01/18 1535      Balance Exercises: Standing   Standing, One Foot on a Step  Eyes open;4 inch;2 reps;10 secs LLE only, RLE buckles    Sidestepping  Upper extremity support;2 reps at counter, UE support        PT Education - 02/01/18 1719    Education provided  Yes    Education Details  progress with BERG and gait velocity; MD  decision about Bioness, will call to set up appointment with [facility], two standing exercises to perform at sink    Person(s) Educated  Patient;Spouse    Methods  Explanation;Demonstration    Comprehension  Verbalized understanding;Returned demonstration       PT Short Term Goals - 02/01/18 1724      PT SHORT TERM GOAL #1   Title  = LTG  PT Long Term Goals - 02/01/18 1722      PT LONG TERM GOAL #1   Title  Pt/wife will verbalize plans for continued community fitness upon d/c from PT.  Updated TARGET 02/23/18    Time  4 per recert 01/24/18    Period  Weeks    Status  On-going    Target Date  02/23/18      PT LONG TERM GOAL #2   Title  Pt will perform sit to stand transfers modified independently, for improved safety and independence with transfers.    Baseline  Min guard assist 01/24/18    Time  4    Period  Weeks    Status  On-going    Target Date  02/23/18      PT LONG TERM GOAL #3   Title  Pt will ambulate at least 300 ft using least restrictive assistive device, with supervision, for improved safety and independence with gait.    Baseline  100 ft min assist 01/24/18    Time  4    Period  Weeks    Status  On-going    Target Date  02/23/18      PT LONG TERM GOAL #4   Title  Pt will improve gait velocity to >/= 1.0 ft/sec with use of appropriate AFO and LRAD with supervision    Baseline  .57 ft/sec    Time  4    Period  Weeks    Status  Revised    Target Date  02/23/18      PT LONG TERM GOAL #5   Title  Pt will negotiate 12 steps using handrail, with supervision, for improved safety and independence with stair negotiation for home.    Time  4    Period  Weeks    Status  On-going    Target Date  02/23/18      PT LONG TERM GOAL #6   Title  Pt will report 0/10 spinning/dizziness with rolling in bed and supine <> sit due to resolution of L BPPV    Status  Achieved      PT LONG TERM GOAL #7   Title  Pt will improve BERG balance score by 8 points to decrease risk  for falls    Baseline  27/56    Time  4    Period  Weeks    Status  New    Target Date  02/23/18            Plan - 02/01/18 1726    Clinical Impression Statement  Due to progress and ability to tolerate full session and standing performed assessment of gait velocity and BERG balance to assess falls risk and areas of balance to address with HEP and interventions.  Goals updated.  Provided pt with two standing balance exercises to start at home to focus on lateral weight shifting.  Also discussed process of getting AFO for RLE and MD's decision regarding Bioness.  Will continue to address in order to progress towards LTG.    Rehab Potential  Good    Clinical Impairments Affecting Rehab Potential  Good family support; severity of deficits    PT Frequency  2x / week    PT Duration  4 weeks per recert 01/24/18    PT Treatment/Interventions  ADLs/Self Care Home Management;DME Instruction;Gait training;Stair training;Functional mobility training;Therapeutic activities;Therapeutic exercise;Balance training;Orthotic Fit/Training;Patient/family education;Neuromuscular re-education;Canalith Repostioning;Vestibular;Electrical Stimulation    PT Next Visit Plan  CHECK BP AND MONITOR FOR ORTHOSTASIS.  gait training with hemiwalker, stair negotiation as able.  Add to standing balance HEP    Consulted and Agree with Plan of Care  Patient;Family member/caregiver    Family Member Consulted  wife       Patient will benefit from skilled therapeutic intervention in order to improve the following deficits and impairments:  Abnormal gait, Decreased activity tolerance, Decreased balance, Decreased knowledge of precautions, Decreased endurance, Decreased knowledge of use of DME, Decreased mobility, Decreased safety awareness, Difficulty walking, Decreased strength, Impaired tone, Postural dysfunction, Dizziness  Visit Diagnosis: Muscle weakness (generalized)  Unsteadiness on feet  Other abnormalities of gait  and mobility  Abnormal posture     Problem List There are no active problems to display for this patient.   Dierdre HighmanAudra F Chrisanna Mishra, PT, DPT 02/01/18    5:38 PM    [facility] [address] [place], [place], [address] Phone: [phone]   Fax:  [phone]  Name: Pennelope BrackenJames M Blackson MRN: [id number] Date of Birth: 02/13/1956

## 2018-02-01 NOTE — Therapy (Signed)
[facility] [address] [place], [place], [address] Phone: [phone]   Fax:  [phone]  Speech Language Pathology Treatment  Patient Details  Name: Tim Stephens MRN: [id number] Date of Birth: 1956/03/19 Referring Provider: Dr. Scarlette Ar   Encounter Date: 02/01/2018  End of Session - 02/01/18 1357    Visit Number  28 using 17 for this therapy course    Number of Visits  21    Date for SLP Re-Evaluation  03/10/18    Authorization - Visit Number  23    Authorization - Number of Visits  26    SLP Start Time  9450    SLP Stop Time   3888    SLP Time Calculation (min)  42 min    Activity Tolerance  Patient tolerated treatment well       History reviewed. No pertinent past medical history.  History reviewed. No pertinent surgical history.  There were no vitals filed for this visit.  Subjective Assessment - 02/01/18 1416    Subjective  Pt brought Lingraphica today,    Patient is accompained by:  Family member [name], wife    Currently in Pain?  No/denies            ADULT SLP TREATMENT - 02/01/18 1423      General Information   Behavior/Cognition  Alert;Cooperative;Pleasant mood      Treatment Provided   Treatment provided  Cognitive-Linquistic      Cognitive-Linquistic Treatment   Treatment focused on  Aphasia;Apraxia    Skilled Treatment  Pt was asked where to find icons and he navigated with initial mod A fading to min A. SLP also used Lingraphica in role-playing situaitons today with pt/wife, with pt requiring occasional min A for navigation/icon searching. SLP reiterated numerous times that pt now has means to communicate to wife re: desires and thoughts that previously would likely not have been able to have been shared. SLP educated pt/wife on location and procedure for "therapy activities" tab for his practice at home.       Assessment / Recommendations / Plan   Plan  Continue with current  plan of care      Progression Toward Goals   Progression toward goals  Progressing toward goals       SLP Education - 02/01/18 1710    Education provided  Yes    Education Details  4 more sessions left including today, [facility] s/h telephone number, "therapy activities" tab    Person(s) Educated  Patient;Spouse    Methods  Explanation;Verbal cues    Comprehension  Verbalized understanding;Verbal cues required;Returned demonstration;Need further instruction       SLP Short Term Goals - 02/01/18 1713      SLP SHORT TERM GOAL #1   Title  Pt will ID object f:5 to sentence description with 90% accuracy and occasional min A    Status  Not Met      SLP SHORT TERM GOAL #2   Title  Pt will complete automatic speech tasks with 70% accuracy and visual cues over 3 sessions    Status  Not Met      SLP SHORT TERM GOAL #3   Title  Pt will utilize multimodal communication and AAC to communicate wants and answer questions with 70% accuracy and occasional min A over 3 sessions    Status  Not Met      SLP SHORT TERM GOAL #4   Title  Pt will write/type  at word level with 80% accuracy and occasional min A over 2 sessions    Status  Not Met       SLP Long Term Goals - 02/01/18 1714      SLP LONG TERM GOAL #1   Title  Pt will comprehend mildly complex conversation over 8 minute conversation with appropriate responses (mulitmodal) and 2 or less requests for repetition    Baseline  01/09/18    Status  Achieved      SLP LONG TERM GOAL #2   Title  Pt will utilize multimodal communication/ACC to produce 2 word response to conversation/question 8/10x with occasional min A    Time  1    Period  Weeks    Status  On-going      SLP LONG TERM GOAL #3   Title  Pt will write 1 word  with 70% accuracy and occasional min A    Time  2    Period  Weeks    Status  On-going      SLP LONG TERM GOAL #4   Title  Pt will produce 1-2 syllable words in phrase completion task with 70% accuracy and occassional mod  A    Baseline  01/04/18    Time  1    Period  Weeks    Status  On-going       Plan - 02/01/18 1711    Clinical Impression Statement  Severe non fluent Broca's aphasia persists, causing persistent non -functional verbal communication of wants/needs and ideas. Pt is becoming more familiar with use of Lingraphica but this SLP believes pt rarely uses at home unless he is adding icons. SLP provided [facility] speech/hearing phone number today for after d/c from this facility. See "skiled intervention" for more details. Continue skilled ST to maximize communication.     Speech Therapy Frequency  2x / week    Duration  -- 8 weeks or 17 total visits    Treatment/Interventions  Language facilitation;Environmental controls;Cueing hierarchy;Oral motor exercises;SLP instruction and feedback;Compensatory strategies;Functional tasks;Cognitive reorganization;Internal/external aids;Multimodal communcation approach;Patient/family education;Other (comment)    Potential to Achieve Goals  Good with SGD    Potential Considerations  Severity of impairments    Consulted and Agree with Plan of Care  Patient;Family member/caregiver    Family Member Consulted  spouse, [name]       Patient will benefit from skilled therapeutic intervention in order to improve the following deficits and impairments:   Aphasia  Verbal apraxia    Problem List There are no active problems to display for this patient.   [facility] ,[place], [place]  02/01/2018, 5:15 PM  [facility] [address] [place], [place], [address] Phone: [phone]   Fax:  [phone]   Name: TIGE MEAS MRN: [id number] Date of Birth: 10-02-1955

## 2018-02-02 ENCOUNTER — Ambulatory Visit: Payer: BLUE CROSS/BLUE SHIELD

## 2018-02-02 ENCOUNTER — Encounter: Payer: Self-pay | Admitting: Occupational Therapy

## 2018-02-02 ENCOUNTER — Ambulatory Visit: Payer: BLUE CROSS/BLUE SHIELD | Admitting: Physical Therapy

## 2018-02-02 ENCOUNTER — Ambulatory Visit: Payer: BLUE CROSS/BLUE SHIELD | Admitting: Occupational Therapy

## 2018-02-02 VITALS — BP 120/91 | HR 83

## 2018-02-02 DIAGNOSIS — R4701 Aphasia: Secondary | ICD-10-CM

## 2018-02-02 DIAGNOSIS — R293 Abnormal posture: Secondary | ICD-10-CM

## 2018-02-02 DIAGNOSIS — M6281 Muscle weakness (generalized): Secondary | ICD-10-CM | POA: Diagnosis not present

## 2018-02-02 DIAGNOSIS — I69318 Other symptoms and signs involving cognitive functions following cerebral infarction: Secondary | ICD-10-CM

## 2018-02-02 DIAGNOSIS — R2681 Unsteadiness on feet: Secondary | ICD-10-CM

## 2018-02-02 DIAGNOSIS — R2689 Other abnormalities of gait and mobility: Secondary | ICD-10-CM

## 2018-02-02 DIAGNOSIS — R29818 Other symptoms and signs involving the nervous system: Secondary | ICD-10-CM

## 2018-02-02 DIAGNOSIS — R482 Apraxia: Secondary | ICD-10-CM

## 2018-02-02 DIAGNOSIS — I69351 Hemiplegia and hemiparesis following cerebral infarction affecting right dominant side: Secondary | ICD-10-CM

## 2018-02-02 NOTE — Therapy (Signed)
[facility] [address] [place], [place], [address] Phone: [phone]   Fax:  [phone]  Occupational Therapy Treatment  Patient Details  Name: Tim Stephens MRN: [id number] Date of Birth: 03/26/1956 Referring Provider: Jenita SeashoreSamuel Kelly, MD   Encounter Date: 02/02/2018  OT End of Session - 02/02/18 1547    Number of Visits  22    Date for OT Re-Evaluation  02/27/18    Authorization Type  BCBS    Authorization Time Period  pt has been approved for additional 8 visits by 02/12/2018 (total of 16)    Authorization - Visit Number  13    Authorization - Number of Visits  16       History reviewed. No pertinent past medical history.  History reviewed. No pertinent surgical history.  There were no vitals filed for this visit.  Subjective Assessment - 02/02/18 1534    Subjective   Good    Patient is accompained by:  Family member    Pertinent History  MONITOR BP!!!  [facility]30/[facility] with LMI cutoff, s/p thromectomy with resultant L CVA due mulitple thromboses;  IVC filter with Coumadin, 06/01/2017 craniotomy    Patient Stated Goals  unable to state due to aphasia    Currently in Pain?  Yes    Pain Score  0-No pain                   OT Treatments/Exercises (OP) - 02/02/18 0001      Neurological Re-education Exercises   Other Exercises 1  Neuromuscular reeducation to address more forced use concept for right extremities.  Used UE Ranger in standing at wall to address assisted mid reach pattern.  Facilitated weight shift and activation onto right leg.  Patient needed to be guided through the motion initially - but then able to sustain positionshoulder flex and elbow extension at waist height. Able to activate shoulder abd / adduction in this position, also able to achieve full active elbow extension.  Patient needed frequent rest breaks - but BP stable throughout session.  Worked in standing with UE  ranger on floor.  Again encouraged activation of right lower extremity - even weight shift fully onto right asa reaching downward to right.   Patient anxious with muscle fatigue (shaking) in right leg - but with facilitation (tapping) able to consistently increase tension in leg at hip and knee to stand erect.  Patient needs frequent cueing to reduce compensatory movements .             Balance Exercises - 02/01/18 1535      Balance Exercises: Standing   Standing, One Foot on a Step  Eyes open;4 inch;2 reps;10 secs LLE only, RLE buckles    Sidestepping  Upper extremity support;2 reps at counter, UE support        OT Education - 02/02/18 1543    Education provided  Yes    Education Details  decreased use of body motion to move right arm    Person(s) Educated  Patient;Spouse    Methods  Explanation;Demonstration    Comprehension  Need further instruction       OT Short Term Goals - 01/23/18 1506      OT SHORT TERM GOAL #1   Title  Pt and wife will be mod I with HEP for RUE ROM - 01/30/2018 (renewed)    Status  On-going      OT SHORT TERM GOAL #2  Title  Pt will be min a for grooming with mod cues from wife and intermittent set up prn    Status  Achieved      OT SHORT TERM GOAL #3   Title  Pt will be mod a for UB bathing at wheelchair level with mod cues    Status  Achieved      OT SHORT TERM GOAL #4   Title  Pt will be mod a for LB bathing at sit to stand level  with mod cues and set up    Status  Achieved      OT SHORT TERM GOAL #5   Title  Pt will be max a for UB dressing at wheelchair level, mod cues    Status  Achieved      OT SHORT TERM GOAL #6   Title  Pt and wife will explore options for 3 in 1 commode for pt to use in bathroom or at bedside    Status  Achieved      OT SHORT TERM GOAL #7   Title  Pt will use RUE as stabilizer 25% of the time during basic self care skills with min a and cues.     Status  On-going        OT Long Term Goals - 01/23/18 1506       OT LONG TERM GOAL #1   Title  Pt and wife will be mod I with home activity program designed to increase independence in basic self care as well as address cognition - 02/27/2018 (renewed)    Status  On-going      OT LONG TERM GOAL #2   Title  Pt will be min a for UB bathing after set up with mod cues    Status  Achieved      OT LONG TERM GOAL #3   Title  Pt will be min a for UB dressing with mod cues    Status  Achieved      OT LONG TERM GOAL #4   Title  Pt will be mod a for LB bathing with mod cues    Status  On-going      OT LONG TERM GOAL #5   Title  Pt will be mod a for LB dressing with mod cues    Status  On-going      OT LONG TERM GOAL #6   Title  Pt will be min a for toilet transfers in bathroom (able to ambulate 3 steps into bathroom with wife).      Status  On-going      OT LONG TERM GOAL #7   Title  Pt will be mod a for clothing mmgt with toileting    Status  On-going      OT LONG TERM GOAL #8   Title  Pt will demonstrate ability to use RUE as stabilizer during basic self care 50% of the time with min a and cues.     Status  On-going            Plan - 02/02/18 1549    Clinical Impression Statement  Patient making slow and steady progress toward OT goals, his blood pressure appears to be more stable recently allowing him eto participate more fully in therapy.      Occupational Profile and client history currently impacting functional performance  HLD, HTN, s/p craniotomy, IVC filter with Coumadin, seizures.    Occupational performance deficits (Please refer to evaluation for details):  ADL's;IADL's;Rest and Sleep;Work;Leisure;Social Participation    Rehab Potential  Good    Current Impairments/barriers affecting progress:  severity of deficits    OT Frequency  2x / week    OT Duration  8 weeks    OT Treatment/Interventions  Self-care/ADL training;Moist Heat;Therapeutic exercise;Neuromuscular education;DME and/or AE instruction;Manual Therapy;Functional  Mobility Training;Passive range of motion;Therapeutic activities;Splinting;Patient/family education;Balance training    Plan  NMR trunk/RUE, sit to stand, stand to sit, balance, functional mobility, possible use of Give Mohr sling for ambulation    Clinical Decision Making  Multiple treatment options, significant modification of task necessary    Consulted and Agree with Plan of Care  Patient;Family member/caregiver    Family Member Consulted  [name]       Patient will benefit from skilled therapeutic intervention in order to improve the following deficits and impairments:  Abnormal gait, Decreased activity tolerance, Decreased balance, Decreased cognition, Decreased knowledge of use of DME, Decreased mobility, Decreased range of motion, Decreased safety awareness, Difficulty walking, Decreased strength, Impaired UE functional use, Impaired tone, Impaired sensation, Pain  Visit Diagnosis: Hemiplegia and hemiparesis following cerebral infarction affecting right dominant side (HCC)  Unsteadiness on feet  Muscle weakness (generalized)  Abnormal posture  Apraxia  Other symptoms and signs involving the nervous system  Other symptoms and signs involving cognitive functions following cerebral infarction    Problem List There are no active problems to display for this patient.   Collier Salina, OTR/L 02/02/2018, 3:51 PM  [facility] [address] [place], [place], [address] Phone: [phone]   Fax:  [phone]  Name: Tim Stephens MRN: [id number] Date of Birth: 1956/05/01

## 2018-02-02 NOTE — Therapy (Signed)
[facility] [address] [place], [place], [address] Phone: [phone]   Fax:  [phone]  Physical Therapy Treatment  Patient Details  Name: Pennelope BrackenJames M Gemmer MRN: [id number] Date of Birth: 08/29/1955 Referring Provider: Jenita SeashoreSamuel Kelly, MD   Encounter Date: 02/02/2018  PT End of Session - 02/02/18 1657    Visit Number  14    Number of Visits  16 awaiting approval of 8 more visits    Date for PT Re-Evaluation  02/23/18    Authorization Type  BCBS- must request more visits and date extension     Authorization Time Period  12/13/17-02/12/18 (2nd set of 8 visits approved)    Authorization - Visit Number  14    Authorization - Number of Visits  16    PT Start Time  1405    PT Stop Time  1447    PT Time Calculation (min)  42 min    Equipment Utilized During Treatment  Gait belt    Activity Tolerance  Patient tolerated treatment well    Behavior During Therapy  [facility] for tasks assessed/performed       No past medical history on file.  No past surgical history on file.  Vitals:   02/02/18 1413  BP: (!) 120/91  Pulse: 83    Subjective Assessment - 02/02/18 1413    Subjective  Informed pt and wife of AFO assessment appointment.  Wife contacted MD about Bioness; MD returned message stating pt still had partially occluded DVT with decreased venous return.  No Bioness at this time.    Patient is accompained by:  Family member wife    Pertinent History  Recently, pt on hold for PT due to medical issues and having skull flap surgery.  Glean HessJames Crispen is a 62 year old right hand dominant male with past medical history significant for hypertension and hyperlipidemia who was [facility] with a aphasia and right hemibody weakness. CTA was obtained and revealed left M1 cutoff. He was taken to IR by Dr. Lionel DecemberFargen for cerebral angiography with mechanical thrombectomy. The patient's acute hospital course was complicated by  worsening cytotoxic edema as well areas of hemorrhagic conversion with left to right midline shift. He was taken to the OR on 06/01/2017 for decompressive left hemicraniectomy. [name]. He was found to have a right peroneal, right posterior tibial, right gastrocnemius,left basilic, and left axillary venous thrombi. He then developed totally occluding superficial thrombophlebitis of the left cephalic vein and right cephalic vein. Venous thrombus then progressed in the left upper arm to the left brachial vein. The patient was not anticoagulated due to recent hemorrhagic conversion of stroke. He underwent serial scans to evaluate for progression of venous thrombosis. PEG was placed by Dr. Allayne ButcherMowery on 06/15/2017. There was progression of right lower extremity DVT from the posterior tibial vein to the right common femoral vein. Vascular surgery was consulted and placed an inferior vena cava filter on 06/18/2017. expressive aphasia, verbal apraxia; [facility], then [facility] for 4 weeks, then [facility], OT, speech discharged 09/21/17    Patient Stated Goals  Per wife:  to get strong enough to get upstairs, and use the bathroom on his own; trying to be more mobile outside the home-fishing, more recreational things    Currently in Pain?  No/denies          [facility] Adult PT Treatment/Exercise - 02/02/18 1648      Ambulation/Gait   Ambulation/Gait  Yes    Ambulation/Gait Assistance  4: Min assist    Ambulation/Gait Assistance Details  donned trial Spry Step posterior leaf spring on RLE and performed gait over indoor surface with hemi walker; cues for knee flexion activation at terminal stance for swing phase    Ambulation Distance (Feet)  75 Feet    Assistive device  Hemi-walker    Gait Pattern  Step-to pattern;Decreased step length - left;Decreased stance time - right;Decreased stride length;Decreased hip/knee flexion - right;Decreased dorsiflexion - right;Right genu  recurvatum;Trunk flexed;Poor foot clearance - right    Ambulation Surface  Level;Indoor    Stairs  --    Curb  3: Mod assist    Curb Details (indicate cue type and reason)  performed 2 sets x 2 reps of one step negotiation to simulate stepping up one step to access downstairs bathroom; therapist performed first three trials with pt and then had wife perform 4th trial.  Performed step up with hemi walker and LLE to ascend, hemiwalker and RLE to descend with assistance to stabilize when ascending or descending and cues to clear R foot onto and off step.  Will continue to address; not safe for pt to perform at home yet with wife due to pt continuing to catch R foot on lip of step even with AFO      Therapeutic Activites    Therapeutic Activities  Other Therapeutic Activities    Other Therapeutic Activities  reviewed MD recommendations to not utilize Bioness due to ongoing presence of LE DVT.  Also discussed date for AFO assessment and that primary PT had requested 8 more visits to cover ongoing therapy visits          PT Education - 02/02/18 1656    Education provided  Yes    Education Details  see TA; one step negotiation training    Person(s) Educated  Patient;Spouse    Methods  Explanation;Demonstration    Comprehension  Need further instruction;Verbalized understanding       PT Short Term Goals - 02/01/18 1724      PT SHORT TERM GOAL #1   Title  = LTG        PT Long Term Goals - 02/01/18 1722      PT LONG TERM GOAL #1   Title  Pt/wife will verbalize plans for continued community fitness upon d/c from PT.  Updated TARGET 02/23/18    Time  4 per recert 01/24/18    Period  Weeks    Status  On-going    Target Date  02/23/18      PT LONG TERM GOAL #2   Title  Pt will perform sit to stand transfers modified independently, for improved safety and independence with transfers.    Baseline  Min guard assist 01/24/18    Time  4    Period  Weeks    Status  On-going    Target Date   02/23/18      PT LONG TERM GOAL #3   Title  Pt will ambulate at least 300 ft using least restrictive assistive device, with supervision, for improved safety and independence with gait.    Baseline  100 ft min assist 01/24/18    Time  4    Period  Weeks    Status  On-going    Target Date  02/23/18      PT LONG TERM GOAL #4   Title  Pt will improve gait velocity to >/= 1.0 ft/sec with use  of appropriate AFO and LRAD with supervision    Baseline  .57 ft/sec    Time  4    Period  Weeks    Status  Revised    Target Date  02/23/18      PT LONG TERM GOAL #5   Title  Pt will negotiate 12 steps using handrail, with supervision, for improved safety and independence with stair negotiation for home.    Time  4    Period  Weeks    Status  On-going    Target Date  02/23/18      PT LONG TERM GOAL #6   Title  Pt will report 0/10 spinning/dizziness with rolling in bed and supine <> sit due to resolution of L BPPV    Status  Achieved      PT LONG TERM GOAL #7   Title  Pt will improve BERG balance score by 8 points to decrease risk for falls    Baseline  27/56    Time  4    Period  Weeks    Status  New    Target Date  02/23/18            Plan - 02/02/18 1658    Clinical Impression Statement  Treatment session focused on gait and one step negotiation training with hemiwalker and trial AFO to determine if pt will be safe to negotiate stairs at home with wife.  Pt fatigued quickly, requiring two seated rest breaks.  Pt is not safe to perform with wife currently.  Once pt receives his own AFO and participates in further gait training will clear pt to perform at home with wife.  Will continue to address in order to progress towards LTG.  Awaiting to hear if insurance will approve 8 more visits.    Rehab Potential  Good    Clinical Impairments Affecting Rehab Potential  Good family support; severity of deficits    PT Frequency  2x / week    PT Duration  4 weeks per recert 01/24/18    PT  Treatment/Interventions  ADLs/Self Care Home Management;DME Instruction;Gait training;Stair training;Functional mobility training;Therapeutic activities;Therapeutic exercise;Balance training;Orthotic Fit/Training;Patient/family education;Neuromuscular re-education;Canalith Repostioning;Vestibular;Electrical Stimulation    PT Next Visit Plan  CHECK BP AND MONITOR FOR ORTHOSTASIS.  gait training with hemiwalker, one step negotiation training to access downstairs bathroom, stair negotiation as able.  Add to standing balance HEP    Recommended Other Services  [facility]ER AFO assessment on 8/6 at 245.    Consulted and Agree with Plan of Care  Patient;Family member/caregiver    Family Member Consulted  wife       Patient will benefit from skilled therapeutic intervention in order to improve the following deficits and impairments:  Abnormal gait, Decreased activity tolerance, Decreased balance, Decreased knowledge of precautions, Decreased endurance, Decreased knowledge of use of DME, Decreased mobility, Decreased safety awareness, Difficulty walking, Decreased strength, Impaired tone, Postural dysfunction, Dizziness  Visit Diagnosis: Muscle weakness (generalized)  Unsteadiness on feet  Other abnormalities of gait and mobility  Abnormal posture     Problem List There are no active problems to display for this patient.   Dierdre Highman, PT, DPT 02/02/18    5:02 PM    [facility] [address] [place], [place], [address] Phone: [phone]   Fax:  [phone]  Name: OREST DYGERT MRN: [id number] Date of Birth: [birth year]

## 2018-02-02 NOTE — Therapy (Signed)
[facility] [address] [place], [place], [address] Phone: [phone]   Fax:  [phone]  Speech Language Pathology Treatment  Patient Details  Name: Tim Stephens MRN: [id number] Date of Birth: 06-Dec-1955 Referring Provider: Dr. Scarlette Ar   Encounter Date: 02/02/2018  End of Session - 02/02/18 1431    Visit Number  15    Number of Visits  21    Date for SLP Re-Evaluation  03/10/18    Authorization Type  30 total ST visits, no auth. Per spouse HHST used 8 visits (22 remaining; 19 after eval and visit 1). Spouse agrees to leave 4 visits prior to end of year. See auth visits    Authorization - Visit Number  24    SLP Start Time  8335    SLP Stop Time   1401    SLP Time Calculation (min)  41 min    Activity Tolerance  Patient tolerated treatment well       History reviewed. No pertinent past medical history.  History reviewed. No pertinent surgical history.  There were no vitals filed for this visit.  Subjective Assessment - 02/01/18 1416    Subjective  Pt brought Lingraphica today,    Patient is accompained by:  Family member [name], wife    Currently in Pain?  No/denies            ADULT SLP TREATMENT - 02/02/18 0001      General Information   Behavior/Cognition  Alert;Cooperative;Pleasant mood      Treatment Provided   Treatment provided  Cognitive-Linquistic      Cognitive-Linquistic Treatment   Treatment focused on  Aphasia;Apraxia    Skilled Treatment  SLP targeted basic programming with Lingraphica with pt today - req'd min A occasionally for making new icons, modifying current icons, searching internet for picture. Pt req'd max A consistently for spelling icon names. Pt verbal expression of one-syllable words in repetition after SGD was 88%, two-syllable words was 0% (0/5); with max A/simultaneous production accuracy improved to 40% (2/5 functional artic).       Assessment /  Recommendations / Plan   Plan  Continue with current plan of care      Progression Toward Goals   Progression toward goals  Progressing toward goals       SLP Education - 02/01/18 1710    Education provided  Yes    Education Details  4 more sessions left including today, [facility] s/h telephone number, "therapy activities" tab    Person(s) Educated  Patient;Spouse    Methods  Explanation;Verbal cues    Comprehension  Verbalized understanding;Verbal cues required;Returned demonstration;Need further instruction       SLP Short Term Goals - 02/01/18 1713      SLP SHORT TERM GOAL #1   Title  Pt will ID object f:5 to sentence description with 90% accuracy and occasional min A    Status  Not Met      SLP SHORT TERM GOAL #2   Title  Pt will complete automatic speech tasks with 70% accuracy and visual cues over 3 sessions    Status  Not Met      SLP SHORT TERM GOAL #3   Title  Pt will utilize multimodal communication and AAC to communicate wants and answer questions with 70% accuracy and occasional min A over 3 sessions    Status  Not Met      SLP SHORT TERM GOAL #4   Title  Pt will write/type at word level with 80% accuracy and occasional min A over 2 sessions    Status  Not Met       SLP Long Term Goals - 02/02/18 1432      SLP LONG TERM GOAL #1   Title  Pt will comprehend mildly complex conversation over 8 minute conversation with appropriate responses (mulitmodal) and 2 or less requests for repetition    Baseline  01/09/18    Status  Achieved      SLP LONG TERM GOAL #2   Title  Pt will utilize multimodal communication/ACC to produce 2 word response to conversation/question 8/10x with occasional min A    Time  1    Period  Weeks    Status  On-going      SLP LONG TERM GOAL #3   Title  Pt will write 1 word  with 70% accuracy and occasional min A    Time  2    Period  Weeks    Status  On-going      SLP LONG TERM GOAL #4   Title  Pt will produce 1-2 syllable words in phrase  completion task with 70% accuracy and occassional mod A    Baseline  01/04/18    Time  1    Period  Weeks    Status  On-going       Plan - 02/02/18 1432    Clinical Impression Statement  Severe non fluent Broca's aphasia persists, causing persistent non -functional verbal communication of wants/needs and ideas. Pt cont to become more familiar with use of Lingraphica re: adding and modifying icons to customize device. See "skiled intervention" for more details. Continue skilled ST to maximize communication.     Speech Therapy Frequency  2x / week    Duration  -- 8 weeks or 17 total visits    Treatment/Interventions  Language facilitation;Environmental controls;Cueing hierarchy;Oral motor exercises;SLP instruction and feedback;Compensatory strategies;Functional tasks;Cognitive reorganization;Internal/external aids;Multimodal communcation approach;Patient/family education;Other (comment)    Potential to Achieve Goals  Good with SGD    Potential Considerations  Severity of impairments    Consulted and Agree with Plan of Care  Patient;Family member/caregiver    Family Member Consulted  spouse, [name]       Patient will benefit from skilled therapeutic intervention in order to improve the following deficits and impairments:   Aphasia  Verbal apraxia    Problem List There are no active problems to display for this patient.   [facility] ,[place], [place]  02/02/2018, 2:33 PM  [place] [address] [place], [place], [address] Phone: [phone]   Fax:  [phone]   Name: Tim Stephens MRN: [id number] Date of Birth: Feb 12, 1956

## 2018-02-07 ENCOUNTER — Ambulatory Visit: Payer: BLUE CROSS/BLUE SHIELD | Admitting: Occupational Therapy

## 2018-02-07 ENCOUNTER — Ambulatory Visit: Payer: BLUE CROSS/BLUE SHIELD

## 2018-02-07 ENCOUNTER — Ambulatory Visit: Payer: BLUE CROSS/BLUE SHIELD | Admitting: Physical Therapy

## 2018-02-07 DIAGNOSIS — M6281 Muscle weakness (generalized): Secondary | ICD-10-CM | POA: Diagnosis not present

## 2018-02-07 DIAGNOSIS — H8112 Benign paroxysmal vertigo, left ear: Secondary | ICD-10-CM

## 2018-02-07 DIAGNOSIS — R2689 Other abnormalities of gait and mobility: Secondary | ICD-10-CM

## 2018-02-07 DIAGNOSIS — R42 Dizziness and giddiness: Secondary | ICD-10-CM

## 2018-02-07 DIAGNOSIS — R2681 Unsteadiness on feet: Secondary | ICD-10-CM

## 2018-02-07 NOTE — Therapy (Signed)
[facility] [address] [place], [place], [address] Phone: [phone]   Fax:  [phone]  Physical Therapy Treatment  Patient Details  Name: Tim BrackenJames M Stephens MRN: [id number] Date of Birth: 11/06/1955 Referring Provider: Jenita SeashoreSamuel Kelly, MD   Encounter Date: 02/07/2018  PT End of Session - 02/07/18 1555    Visit Number  15    Number of Visits  16 awaiting approval of 8 more visits    Date for PT Re-Evaluation  02/23/18    Authorization Type  [employer]- must request more visits and date extension     Authorization Time Period  12/13/17-02/12/18 (2nd set of 8 visits approved) - have requested 8 more beyond 8/5 - awaiting approval    Authorization - Visit Number  15    Authorization - Number of Visits  16    PT Start Time  1400    PT Stop Time  1445    PT Time Calculation (min)  45 min    Equipment Utilized During Treatment  Gait belt    Activity Tolerance  Patient tolerated treatment well    Behavior During Therapy  [facility] for tasks assessed/performed       No past medical history on file.  No past surgical history on file.  There were no vitals filed for this visit.  Subjective Assessment - 02/07/18 1406    Subjective  Have not heard back from insurance about approval of more visits.  Pt had appointment with vascular physician today who took him off one BP medication and to monitor BP; if systolic raises >150 or 160 to notify him.  Wife also asked vascular MD if it would be safe to do Bioness with DVT; vascular MD felt it would be safe but referring physician has not given clearance for use of Bioness.  Pt reporting dizziness began again on L side a few days ago.  Would like to re-assess and treat.  Pt has been stepping up the one step at home to access the powder room bathroom with wife's assistance without any issues.      Patient is accompained by:  Family member wife    Pertinent History  Recently, pt on hold for PT due to  medical issues and having skull flap surgery.  Tim HessJames Stephens is a 62 year old right hand dominant male with past medical history significant for hypertension and hyperlipidemia who [facility] with a aphasia and right hemibody weakness. CTA was obtained and revealed left M1 cutoff. He was taken to IR by Dr. Lionel DecemberFargen for cerebral angiography with mechanical thrombectomy. The patient's acute hospital course was complicated by worsening cytotoxic edema as well areas of hemorrhagic conversion with left to right midline shift. He was taken to the OR on 06/01/2017 for decompressive left hemicraniectomy. [name]. He was found to have a right peroneal, right posterior tibial, right gastrocnemius,left basilic, and left axillary venous thrombi. He then developed totally occluding superficial thrombophlebitis of the left cephalic vein and right cephalic vein. Venous thrombus then progressed in the left upper arm to the left brachial vein. The patient was not anticoagulated due to recent hemorrhagic conversion of stroke. He underwent serial scans to evaluate for progression of venous thrombosis. PEG was placed by Dr. Allayne ButcherMowery on 06/15/2017. There was progression of right lower extremity DVT from the posterior tibial vein to the right common femoral vein. Vascular surgery was consulted and placed an inferior vena cava filter on 06/18/2017. expressive aphasia, verbal apraxia; therapy  at [facility] January 2019, then [facility] rehab for 4 weeks, then [facility] therapy PT, OT, speech discharged 09/21/17    Patient Stated Goals  Per wife:  to get strong enough to get upstairs, and use the bathroom on his own; trying to be more mobile outside the home-fishing, more recreational things    Currently in Pain?  No/denies             Vestibular Assessment - 02/07/18 1544      Positional Testing   Dix-Hallpike  Dix-Hallpike Left      Dix-Hallpike Left   Dix-Hallpike Left Duration  10 seconds     Dix-Hallpike Left Symptoms  Upbeat, left rotatory nystagmus               [facility] Adult PT Treatment/Exercise - 02/07/18 1551      Transfers   Transfers  Sit to Stand;Stand to [employer] Transfers    Sit to Stand  4: Min guard    Stand to Sit  4: Min assist    Stand Pivot Transfers  4: Min assist      Therapeutic Activites    Therapeutic Activities  Other Therapeutic Activities    Other Therapeutic Activities  continued to discuss Bioness; DVT is a precaution to using Bioness and requires physician clearance.  Since referring provider (that approved PT POC) did not clear pt to utilize Bioness, will not add to treatment interventions (even though vascular physician felt it would be safe). Recommended that after pt has f/u dopplers in November to revisit subject with referring physician.  Pt and wife agreeable.        Vestibular Treatment/Exercise - 02/07/18 1545      Vestibular Treatment/Exercise   Vestibular Treatment Provided  Canalith Repositioning    Canalith Repositioning  Epley Manuever Left       EPLEY MANUEVER LEFT   Number of Reps   1    Overall Response   Improved Symptoms     RESPONSE DETAILS LEFT  pt reported nausea after 1st Epley maneuver but requested to continue with re-assessment and treatment if needed.  After 2nd Epley pt reported decreased symptoms of dizziness, decreased nausea but very fatigued and did not feel he would be able to participate in OT or SLP.            PT Education - 02/07/18 1554    Education provided  Yes    Education Details  see TA    Person(s) Educated  Patient;Spouse    Methods  Explanation    Comprehension  Verbalized understanding       PT Short Term Goals - 02/01/18 1724      PT SHORT TERM GOAL #1   Title  = LTG        PT Long Term Goals - 02/01/18 1722      PT LONG TERM GOAL #1   Title  Pt/wife will verbalize plans for continued community fitness upon d/c from PT.  Updated TARGET 02/23/18    Time  4 per  recert 01/24/18    Period  Weeks    Status  On-going    Target Date  02/23/18      PT LONG TERM GOAL #2   Title  Pt will perform sit to stand transfers modified independently, for improved safety and independence with transfers.    Baseline  Min guard assist 01/24/18    Time  4    Period  Weeks    Status  On-going    Target Date  02/23/18      PT LONG TERM GOAL #3   Title  Pt will ambulate at least 300 ft using least restrictive assistive device, with supervision, for improved safety and independence with gait.    Baseline  100 ft min assist 01/24/18    Time  4    Period  Weeks    Status  On-going    Target Date  02/23/18      PT LONG TERM GOAL #4   Title  Pt will improve gait velocity to >/= 1.0 ft/sec with use of appropriate AFO and LRAD with supervision    Baseline  .57 ft/sec    Time  4    Period  Weeks    Status  Revised    Target Date  02/23/18      PT LONG TERM GOAL #5   Title  Pt will negotiate 12 steps using handrail, with supervision, for improved safety and independence with stair negotiation for home.    Time  4    Period  Weeks    Status  On-going    Target Date  02/23/18      PT LONG TERM GOAL #6   Title  Pt will report 0/10 spinning/dizziness with rolling in bed and supine <> sit due to resolution of L BPPV    Status  Achieved      PT LONG TERM GOAL #7   Title  Pt will improve BERG balance score by 8 points to decrease risk for falls    Baseline  27/56    Time  4    Period  Weeks    Status  New    Target Date  02/23/18            Plan - 02/07/18 1557    Clinical Impression Statement  Treatment session today focused on continued discussion regarding treatment interventions and plan for next set of therapy visits if approved.  Also performed re-assessment of peripheral vestibular system due to return of dizziness with rolling to L side.  Pt does present with recurrent L posterior canal canalithiasis, treated x 2 today with CRM with improvement in  symptoms.  Due to recurrent BPPV, it may be beneficial to teach wife how to perform home Epley with pillow.  Will continue to assess and address as needed and as pt is able to tolerate.    Rehab Potential  Good    Clinical Impairments Affecting Rehab Potential  Good family support; severity of deficits    PT Frequency  2x / week    PT Duration  4 weeks per recert 01/24/18    PT Treatment/Interventions  ADLs/Self Care Home Management;DME Instruction;Gait training;Stair training;Functional mobility training;Therapeutic activities;Therapeutic exercise;Balance training;Orthotic Fit/Training;Patient/family education;Neuromuscular re-education;Canalith Repostioning;Vestibular;Electrical Stimulation    PT Next Visit Plan  CHECK BP AND MONITOR FOR ORTHOSTASIS.  Treat L BPPV and teach wife home maneuver with pillow behind pt's back.  gait training with hemiwalker, one step negotiation training to access downstairs bathroom, stair negotiation as able.  Add to standing balance HEP    Consulted and Agree with Plan of Care  Patient;Family member/caregiver    Family Member Consulted  wife       Patient will benefit from skilled therapeutic intervention in order to improve the following deficits and impairments:  Abnormal gait, Decreased activity tolerance, Decreased balance, Decreased knowledge of precautions, Decreased endurance, Decreased knowledge of use of DME, Decreased mobility, Decreased safety awareness, Difficulty walking,  Decreased strength, Impaired tone, Postural dysfunction, Dizziness  Visit Diagnosis: BPPV (benign paroxysmal positional vertigo), left  Dizziness and giddiness  Muscle weakness (generalized)  Unsteadiness on feet  Other abnormalities of gait and mobility     Problem List There are no active problems to display for this patient.   Dierdre Highman, PT, DPT 02/07/18    4:03 PM    [facility] [address] [place], [place], [address] Phone: [phone]   Fax:  [phone]  Name: ROLEN CONGER MRN: [id number] Date of Birth: [birth year]

## 2018-02-08 ENCOUNTER — Ambulatory Visit: Payer: BLUE CROSS/BLUE SHIELD

## 2018-02-08 ENCOUNTER — Ambulatory Visit: Payer: BLUE CROSS/BLUE SHIELD | Admitting: Occupational Therapy

## 2018-02-08 ENCOUNTER — Ambulatory Visit: Payer: BLUE CROSS/BLUE SHIELD | Admitting: Physical Therapy

## 2018-02-13 ENCOUNTER — Ambulatory Visit: Payer: BLUE CROSS/BLUE SHIELD | Admitting: Speech Pathology

## 2018-02-13 ENCOUNTER — Ambulatory Visit: Payer: BLUE CROSS/BLUE SHIELD | Admitting: Occupational Therapy

## 2018-02-13 ENCOUNTER — Ambulatory Visit: Payer: BLUE CROSS/BLUE SHIELD | Attending: Family Medicine | Admitting: Physical Therapy

## 2018-02-13 DIAGNOSIS — R4701 Aphasia: Secondary | ICD-10-CM | POA: Insufficient documentation

## 2018-02-13 DIAGNOSIS — H8112 Benign paroxysmal vertigo, left ear: Secondary | ICD-10-CM

## 2018-02-13 DIAGNOSIS — R482 Apraxia: Secondary | ICD-10-CM

## 2018-02-13 DIAGNOSIS — I69318 Other symptoms and signs involving cognitive functions following cerebral infarction: Secondary | ICD-10-CM | POA: Insufficient documentation

## 2018-02-13 DIAGNOSIS — R29818 Other symptoms and signs involving the nervous system: Secondary | ICD-10-CM | POA: Insufficient documentation

## 2018-02-13 DIAGNOSIS — I69351 Hemiplegia and hemiparesis following cerebral infarction affecting right dominant side: Secondary | ICD-10-CM | POA: Diagnosis present

## 2018-02-13 DIAGNOSIS — R293 Abnormal posture: Secondary | ICD-10-CM | POA: Diagnosis present

## 2018-02-13 DIAGNOSIS — R2681 Unsteadiness on feet: Secondary | ICD-10-CM

## 2018-02-13 DIAGNOSIS — R2689 Other abnormalities of gait and mobility: Secondary | ICD-10-CM | POA: Insufficient documentation

## 2018-02-13 DIAGNOSIS — M6281 Muscle weakness (generalized): Secondary | ICD-10-CM | POA: Diagnosis present

## 2018-02-13 DIAGNOSIS — R42 Dizziness and giddiness: Secondary | ICD-10-CM | POA: Insufficient documentation

## 2018-02-13 DIAGNOSIS — R29898 Other symptoms and signs involving the musculoskeletal system: Secondary | ICD-10-CM | POA: Insufficient documentation

## 2018-02-13 NOTE — Therapy (Signed)
[place] [address] [place], [place], [address] Phone: [phone]   Fax:  [phone]  Speech Language Pathology Treatment  Patient Details  Name: Tim Stephens MRN: [id number] Date of Birth: 04-Apr-1956 Referring Provider: Dr. Scarlette Ar   Encounter Date: 02/13/2018  End of Session - 02/13/18 1424    Visit Number  16    Number of Visits  21    Date for SLP Re-Evaluation  03/10/18    SLP Start Time  2637    SLP Stop Time   1402    SLP Time Calculation (min)  45 min    Activity Tolerance  Patient tolerated treatment well       No past medical history on file.  No past surgical history on file.  There were no vitals filed for this visit.  Subjective Assessment - 02/13/18 1414    Subjective  Pt tearful, showing picture of his daughter on Touch Talk     Patient is accompained by:  Family member [name]    Currently in Pain?  No/denies            ADULT SLP TREATMENT - 02/13/18 1415      General Information   Behavior/Cognition  Alert;Cooperative;Pleasant mood      Treatment Provided   Treatment provided  Cognitive-Linquistic      Cognitive-Linquistic Treatment   Treatment focused on  Aphasia;Apraxia    Skilled Treatment  Pt and spouse have continued to customize Elliott with family and friends, as well as hobbies. I instructed spouse to use 1-2 sentences for icons when applicable, to facilitate conversation and accruacy/completness of information. For example, rather than "hunt" customize to "I like to hunt with my friend Nicki Reaper"  Pt utilized TouchTalk, gestures to answer questions re: family and friends. Written expression at word level employing white board on Touch talk, pt successfully utilized colors and erase with rare min A. Consistent fill in the blank, written letter choice and tracing rerquired for simple family, friend names. Verbal expression facilitated with carrier phrases  (I like to, I  have) with personally relevant words with written, visual articulatory cues and unison to produce sentences.       Assessment / Recommendations / Plan   Plan  Continue with current plan of care      Progression Toward Goals   Progression toward goals  Progressing toward goals       SLP Education - 02/13/18 1421    Education provided  Yes    Education Details  3 more sessions including today remain. Use sentences to descripbe/communicate hobbies, preferences and family members under customized icons.     Person(s) Educated  Patient;Spouse    Methods  Explanation;Verbal cues;Demonstration    Comprehension  Verbalized understanding       SLP Short Term Goals - 02/13/18 1423      SLP SHORT TERM GOAL #1   Title  Pt will ID object f:5 to sentence description with 90% accuracy and occasional min A    Status  Not Met      SLP SHORT TERM GOAL #2   Title  Pt will complete automatic speech tasks with 70% accuracy and visual cues over 3 sessions    Status  Not Met      SLP SHORT TERM GOAL #3   Title  Pt will utilize multimodal communication and AAC to communicate wants and answer questions with 70% accuracy and occasional min A over 3 sessions  Status  Not Met      SLP SHORT TERM GOAL #4   Title  Pt will write/type at word level with 80% accuracy and occasional min A over 2 sessions    Status  Not Met       SLP Long Term Goals - 02/13/18 1424      SLP LONG TERM GOAL #1   Title  Pt will comprehend mildly complex conversation over 8 minute conversation with appropriate responses (mulitmodal) and 2 or less requests for repetition    Baseline  01/09/18    Status  Achieved      SLP LONG TERM GOAL #2   Title  Pt will utilize multimodal communication/ACC to produce 2 word response to conversation/question 8/10x with occasional min A    Baseline  02/13/18;     Time  1    Period  Weeks    Status  On-going      SLP LONG TERM GOAL #3   Title  Pt will write 1 word  with 70% accuracy and  occasional min A    Baseline  02/13/18    Time  2    Period  Weeks    Status  On-going      SLP LONG TERM GOAL #4   Title  Pt will produce 1-2 syllable words in phrase completion task with 70% accuracy and occassional mod A    Baseline  01/04/18    Time  1    Period  Weeks    Status  On-going       Plan - 02/13/18 1423    Clinical Impression Statement  Severe non fluent Broca's aphasia persists, causing persistent non -functional verbal communication of wants/needs and ideas. Pt cont to become more familiar with use of Lingraphica re: adding and modifying icons to customize device. See "skiled intervention" for more details. Continue skilled ST to maximize communication.     Speech Therapy Frequency  1x /week    Treatment/Interventions  Language facilitation;Environmental controls;Cueing hierarchy;Oral motor exercises;SLP instruction and feedback;Compensatory strategies;Functional tasks;Cognitive reorganization;Internal/external aids;Multimodal communcation approach;Patient/family education;Other (comment)    Potential to Achieve Goals  Good    Potential Considerations  Severity of impairments    Consulted and Agree with Plan of Care  Patient;Family member/caregiver       Patient will benefit from skilled therapeutic intervention in order to improve the following deficits and impairments:   Aphasia  Verbal apraxia    Problem List There are no active problems to display for this patient.   Dinisha Cai, Annye Rusk MS, CCC-SLP 02/13/2018, 2:25 PM  [place] [address] [place], [place], [address] Phone: [phone]   Fax:  [phone]   Name: TORY SEPTER MRN: [id number] Date of Birth: 1956/07/09

## 2018-02-14 NOTE — Therapy (Signed)
[facility] [address] [place], [place], [address] Phone: [phone]   Fax:  [phone]  Physical Therapy Treatment  Patient Details  Name: Tim Stephens MRN: [id number] Date of Birth: [birth year] Referring Provider: Jenita Seashore, MD   Encounter Date: 02/13/2018  PT End of Session - 02/14/18 0816    Visit Number  16    Number of Visits  24 8 more visits approved on 8/6    Date for PT Re-Evaluation  02/23/18    Authorization Type  BC[employer]- must request more visits and date extension     Authorization Time Period  8 more visist approved from 7/31 - 12/30 - can request more visits if needed    Authorization - Visit Number  16    Authorization - Number of Visits  24    PT Start Time  1400    PT Stop Time  1456    PT Time Calculation (min)  56 min    Activity Tolerance  Patient tolerated treatment well    Behavior During Therapy  [facility] for tasks assessed/performed       No past medical history on file.  No past surgical history on file.  There were no vitals filed for this visit.  Subjective Assessment - 02/14/18 0809    Subjective  Pt did not feel well all weekend; still having dizziness rolling to L.  Wants to get rid of it today.  Approved for 8 more visits for PT.  Still awaiting decision for OT.      Patient is accompained by:  Family member wife    Pertinent History  Recently, pt on hold for PT due to medical issues and having skull flap surgery.  Tim Stephens is a 62 year old right hand dominant male with past medical history significant for hypertension and hyperlipidemia who was admitted to [facility] on 05/30/2017 with a aphasia and right hemibody weakness. CTA was obtained and revealed left M1 cutoff. He was taken to IR by Dr. Lionel December for cerebral angiography with mechanical thrombectomy. The patient's acute hospital course was complicated by worsening cytotoxic edema as well areas of hemorrhagic conversion with left  to right midline shift. He was taken to the OR on 06/01/2017 for decompressive left hemicraniectomy. [name] He was found to have a right peroneal, right posterior tibial, right gastrocnemius,left basilic, and left axillary venous thrombi. He then developed totally occluding superficial thrombophlebitis of the left cephalic vein and right cephalic vein. Venous thrombus then progressed in the left upper arm to the left brachial vein. The patient was not anticoagulated due to recent hemorrhagic conversion of stroke. He underwent serial scans to evaluate for progression of venous thrombosis. PEG was placed by Dr. Allayne Butcher on 06/15/2017. There was progression of right lower extremity DVT from the posterior tibial vein to the right common femoral vein. Vascular surgery was consulted and placed an inferior vena cava filter on 06/18/2017. expressive aphasia, verbal apraxia; therapy at [facility] January 2019, then [facility] rehab for 4 weeks, then [facility] therapy PT, OT, speech discharged 09/21/17    Patient Stated Goals  Per wife:  to get strong enough to get upstairs, and use the bathroom on his own; trying to be more mobile outside the home-fishing, more recreational things    Currently in Pain?  No/denies             Vestibular Assessment - 02/13/18 1500      Positional Testing   Dix-Hallpike  Dix-Hallpike Left  Dix-Hallpike Left   Dix-Hallpike Left Duration  10 seconds    Dix-Hallpike Left Symptoms  Upbeat, left rotatory nystagmus               [facility] Adult PT Treatment/Exercise - 02/14/18 0813      Ambulation/Gait   Ambulation/Gait  Yes    Ambulation/Gait Assistance  4: Min assist    Ambulation/Gait Assistance Details  Orthotist present for assessment of AFO needs for RLE.  Performed first repetition of gait without trial AFO to allow orthotist to assess gait and needs; second repetition of gait donned trial R Spry Step PLS with R genu recurvatum noted; added heel wedge with  decrease in recurvatum with appropriate cues from therapist for more upright trunk posture and increased anterior weight shift during R stance    Ambulation Distance (Feet)  75 Feet x 2    Assistive device  Hemi-walker    Gait Pattern  Decreased step length - left;Decreased stance time - right;Decreased stride length;Decreased dorsiflexion - right;Right genu recurvatum;Trunk flexed;Step-through pattern;Decreased weight shift to right    Ambulation Surface  Level;Indoor      Vestibular Treatment/Exercise - 02/13/18 1505      Vestibular Treatment/Exercise   Vestibular Treatment Provided  Canalith Repositioning    Canalith Repositioning  Epley Manuever Left       EPLEY MANUEVER LEFT   Number of Reps   3    Overall Response   Improved Symptoms     RESPONSE DETAILS LEFT  modified with pillow behind back due to lack of cervical extension + rotation ROM.   By third repetition pt experiencing decreased symptoms of vertigo, decreased nystagmus and improved tolerance of testing/treatment            PT Education - 02/14/18 0816    Education provided  Yes    Education Details  will proceed with Spry Step PLS     Person(s) Educated  Patient;Spouse    Methods  Explanation    Comprehension  Verbalized understanding       PT Short Term Goals - 02/01/18 1724      PT SHORT TERM GOAL #1   Title  = LTG        PT Long Term Goals - 02/01/18 1722      PT LONG TERM GOAL #1   Title  Pt/wife will verbalize plans for continued community fitness upon d/c from PT.  Updated TARGET 02/23/18    Time  4 per recert 01/24/18    Period  Weeks    Status  On-going    Target Date  02/23/18      PT LONG TERM GOAL #2   Title  Pt will perform sit to stand transfers modified independently, for improved safety and independence with transfers.    Baseline  Min guard assist 01/24/18    Time  4    Period  Weeks    Status  On-going    Target Date  02/23/18      PT LONG TERM GOAL #3   Title  Pt will ambulate  at least 300 ft using least restrictive assistive device, with supervision, for improved safety and independence with gait.    Baseline  100 ft min assist 01/24/18    Time  4    Period  Weeks    Status  On-going    Target Date  02/23/18      PT LONG TERM GOAL #4   Title  Pt will improve gait velocity  to >/= 1.0 ft/sec with use of appropriate AFO and LRAD with supervision    Baseline  .57 ft/sec    Time  4    Period  Weeks    Status  Revised    Target Date  02/23/18      PT LONG TERM GOAL #5   Title  Pt will negotiate 12 steps using handrail, with supervision, for improved safety and independence with stair negotiation for home.    Time  4    Period  Weeks    Status  On-going    Target Date  02/23/18      PT LONG TERM GOAL #6   Title  Pt will report 0/10 spinning/dizziness with rolling in bed and supine <> sit due to resolution of L BPPV    Status  Achieved      PT LONG TERM GOAL #7   Title  Pt will improve BERG balance score by 8 points to decrease risk for falls    Baseline  27/56    Time  4    Period  Weeks    Status  New    Target Date  02/23/18            Plan - 02/14/18 0819    Clinical Impression Statement  Pt approved for 8 more PT visits.  Treatment session today focused on continued assessment and treatment of L BPPV with use of pillow behind back to accomodate spinal and muscular ROM limitations.  Pt with improved tolerance of testing and treatment today; following 3 CRM treatments pt symptoms improved with no episodes of emesis.  Orthotist also present to perform gait assessment and make recommendations for AFO (see gait section).  Pt able to tolerate gait following BPPV treatment.  Will continue to assess and treat as indicated and will begin gait training with AFO once received.    Rehab Potential  Good    Clinical Impairments Affecting Rehab Potential  Good family support; severity of deficits    PT Frequency  2x / week    PT Duration  4 weeks    PT  Treatment/Interventions  ADLs/Self Care Home Management;DME Instruction;Gait training;Stair training;Functional mobility training;Therapeutic activities;Therapeutic exercise;Balance training;Orthotic Fit/Training;Patient/family education;Neuromuscular re-education;Canalith Repostioning;Vestibular;Electrical Stimulation    PT Next Visit Plan  CHECK BP AND MONITOR FOR ORTHOSTASIS.  LTG and recert needed by 8/16.  Treat L BPPV if needed/teach wife home maneuver with pillow.  Pt to receive AFO In 2 weeks from [facility].  gait training with hemiwalker, one step negotiation training to access downstairs bathroom, stair negotiation as able.  Add to standing balance HEP    Consulted and Agree with Plan of Care  Patient;Family member/caregiver    Family Member Consulted  wife       Patient will benefit from skilled therapeutic intervention in order to improve the following deficits and impairments:  Abnormal gait, Decreased activity tolerance, Decreased balance, Decreased knowledge of precautions, Decreased endurance, Decreased knowledge of use of DME, Decreased mobility, Decreased safety awareness, Difficulty walking, Decreased strength, Impaired tone, Postural dysfunction, Dizziness  Visit Diagnosis: Dizziness and giddiness  BPPV (benign paroxysmal positional vertigo), left  Muscle weakness (generalized)  Other abnormalities of gait and mobility  Unsteadiness on feet     Problem List There are no active problems to display for this patient.   Dierdre HighmanAudra F Maycee Blasco, PT, DPT 02/14/18    8:43 AM    [facility] [address] [place], [place], [address]  Phone: [phone]   Fax:  [phone]  Name: Tim Stephens MRN: [id number] Date of Birth: Jul 24, 1955

## 2018-02-15 ENCOUNTER — Encounter: Payer: Self-pay | Admitting: Rehabilitation

## 2018-02-15 ENCOUNTER — Ambulatory Visit: Payer: BLUE CROSS/BLUE SHIELD | Admitting: Rehabilitation

## 2018-02-15 ENCOUNTER — Encounter: Payer: Self-pay | Admitting: Occupational Therapy

## 2018-02-15 ENCOUNTER — Ambulatory Visit: Payer: BLUE CROSS/BLUE SHIELD | Admitting: Occupational Therapy

## 2018-02-15 ENCOUNTER — Ambulatory Visit: Payer: BLUE CROSS/BLUE SHIELD

## 2018-02-15 VITALS — BP 133/90

## 2018-02-15 DIAGNOSIS — R42 Dizziness and giddiness: Secondary | ICD-10-CM | POA: Diagnosis not present

## 2018-02-15 DIAGNOSIS — I69351 Hemiplegia and hemiparesis following cerebral infarction affecting right dominant side: Secondary | ICD-10-CM

## 2018-02-15 DIAGNOSIS — I69318 Other symptoms and signs involving cognitive functions following cerebral infarction: Secondary | ICD-10-CM

## 2018-02-15 DIAGNOSIS — R2681 Unsteadiness on feet: Secondary | ICD-10-CM

## 2018-02-15 DIAGNOSIS — R482 Apraxia: Secondary | ICD-10-CM

## 2018-02-15 DIAGNOSIS — R29818 Other symptoms and signs involving the nervous system: Secondary | ICD-10-CM

## 2018-02-15 DIAGNOSIS — R29898 Other symptoms and signs involving the musculoskeletal system: Secondary | ICD-10-CM

## 2018-02-15 DIAGNOSIS — M6281 Muscle weakness (generalized): Secondary | ICD-10-CM

## 2018-02-15 DIAGNOSIS — R4701 Aphasia: Secondary | ICD-10-CM

## 2018-02-15 DIAGNOSIS — R2689 Other abnormalities of gait and mobility: Secondary | ICD-10-CM

## 2018-02-15 NOTE — Patient Instructions (Signed)
Put 3-4 phrases on "family" page that Rosanne AshingJim would like to communicate to family.  Put 3-4 items each on 2 restaurant pages that Rosanne AshingJim would like at those places

## 2018-02-15 NOTE — Therapy (Signed)
[facility] [address] [place], [place], [address] Phone: [phone]   Fax:  [phone]  Physical Therapy Treatment  Patient Details  Name: LEEANDRE NORDLING MRN: [id number] Date of Birth: 1955-09-24 Referring Provider: Jenita Seashore, MD   Encounter Date: 02/15/2018  PT End of Session - 02/15/18 1518    Visit Number  17    Number of Visits  24   8 more visits approved on 8/6   Date for PT Re-Evaluation  02/23/18    Authorization Type  BCBS- must request more visits and date extension     Authorization Time Period  8 more visist approved from 7/31 - 12/30 - can request more visits if needed    Authorization - Visit Number  17    Authorization - Number of Visits  24    PT Start Time  1316    PT Stop Time  1400    PT Time Calculation (min)  44 min    Activity Tolerance  Patient tolerated treatment well    Behavior During Therapy  [facility] for tasks assessed/performed       History reviewed. No pertinent past medical history.  History reviewed. No pertinent surgical history.  Vitals:   02/15/18 1458  BP: 133/90    Subjective Assessment - 02/15/18 1318    Subjective  Pt reports feeling better today, was tired after last session.     Pertinent History  Recently, pt on hold for PT due to medical issues and having skull flap surgery.  Jonaven Hilgers is a 62 year old right hand dominant male with past medical history significant for hypertension and hyperlipidemia who was admitted to [facility] on 05/30/2017 with a aphasia and right hemibody weakness. CTA was obtained and revealed left M1 cutoff. He was taken to IR by Dr. Lionel December for cerebral angiography with mechanical thrombectomy. The patient's acute hospital course was complicated by worsening cytotoxic edema as well areas of hemorrhagic conversion with left to right midline shift. He was taken to the OR on 06/01/2017 for decompressive left hemicraniectomy. [name] He was found to have  a right peroneal, right posterior tibial, right gastrocnemius,left basilic, and left axillary venous thrombi. He then developed totally occluding superficial thrombophlebitis of the left cephalic vein and right cephalic vein. Venous thrombus then progressed in the left upper arm to the left brachial vein. The patient was not anticoagulated due to recent hemorrhagic conversion of stroke. He underwent serial scans to evaluate for progression of venous thrombosis. PEG was placed by Dr. Allayne Butcher on 06/15/2017. There was progression of right lower extremity DVT from the posterior tibial vein to the right common femoral vein. Vascular surgery was consulted and placed an inferior vena cava filter on 06/18/2017. expressive aphasia, verbal apraxia; therapy at [facility] January 2019, then [facility] rehab for 4 weeks, then [facility] therapy PT, OT, speech discharged 09/21/17    Patient Stated Goals  Per wife:  to get strong enough to get upstairs, and use the bathroom on his own; trying to be more mobile outside the home-fishing, more recreational things    Currently in Pain?  No/denies                       [facility] Adult PT Treatment/Exercise - 02/15/18 0001      Transfers   Transfers  Sit to Stand;Stand to Sit;Stand Pivot Transfers    Sit to Stand  5: Supervision;With upper extremity assist    Sit to  Stand Details  Verbal cues for technique;Verbal cues for sequencing    Sit to Stand Details (indicate cue type and reason)  Cues for hand placement and improved R foot placement    Stand to Sit  4: Min guard;4: Min assist    Stand to Sit Details (indicate cue type and reason)  Verbal cues for sequencing;Verbal cues for technique;Manual facilitation for weight shifting;Manual facilitation for placement    Stand to Sit Details  max cues for hand placement       Ambulation/Gait   Ambulation/Gait  Yes    Ambulation/Gait Assistance  4: Min assist    Ambulation/Gait Assistance Details  Had pt warm  up with ambulation with use of Spry Step AFO on RLE and hemi walker x 115' as we had discussed that PT would like to trial new AD during session.  Pt initially anxious about this, however was agreeable.  During gait with hemi walker, provided cues for more upright posture with facilitation at R axilla for this as well as at R pelvis for improved forward weight shift onto RLE during stance.  Pt tends to keep weight shifted to the L due to hemi walker placement.  Then had pt ambulate x 115' with use of SBQC.  Pt able to ambulate in this manner at min A also with mostly same facilitation as previously mentioned, however does requires cues for smoother L step and larger L step to increase time on RLE.  Also provided slightly increased assist at trunk for more upright posture as RUE tends to bring pt forward.  Due to ease of gait with [facility], then had pt ambulate with quad trip cane again to further allow for improved R lateral weight shift and improved activation of RLE along with improved postural control.  Pt requires min to mod A (mod A near end of gait with quad tip cane due to fatigue).  Note that with increased fatigue, R foot catches slightly and also note increased R knee genu recurvatum.  Therefore added small heel wedge (they report primary PT has done this intermittently as well).  This seemed to decrease recurvatum slightly, however due to fatigue, he was unable to ambulate as long as he previously did.  With quad tip cane, ambulated 65', 75' and another 40'.   BP following gait was 135/90.    Ambulation Distance (Feet)  115 Feet   see above   Assistive device  Hemi-walker;Small based quad cane   quad tip cane   Gait Pattern  Decreased step length - left;Decreased stance time - right;Decreased stride length;Decreased dorsiflexion - right;Right genu recurvatum;Trunk flexed;Step-through pattern;Decreased weight shift to right    Ambulation Surface  Level;Indoor      Self-Care   Self-Care  Other  Self-Care Comments    Other Self-Care Comments   Discussed rationale for trial use of SBQC and quad tip cane.  Also discussed that PT would ensure that primary PT aware of how pt performed with each device so that she may better guide them to LRAD.  This PT explained that she did not want them buying anything yet as we need to practice more in clinic and also that if he is going to progress quickly to quad tip cane, I would hold off and just buy quad tip cane.  Both he and wife verbalized understanding.                 PT Short Term Goals - 02/01/18 1724  PT SHORT TERM GOAL #1   Title  = LTG        PT Long Term Goals - 02/01/18 1722      PT LONG TERM GOAL #1   Title  Pt/wife will verbalize plans for continued community fitness upon d/c from PT.  Updated TARGET 02/23/18    Time  4   per recert 01/24/18   Period  Weeks    Status  On-going    Target Date  02/23/18      PT LONG TERM GOAL #2   Title  Pt will perform sit to stand transfers modified independently, for improved safety and independence with transfers.    Baseline  Min guard assist 01/24/18    Time  4    Period  Weeks    Status  On-going    Target Date  02/23/18      PT LONG TERM GOAL #3   Title  Pt will ambulate at least 300 ft using least restrictive assistive device, with supervision, for improved safety and independence with gait.    Baseline  100 ft min assist 01/24/18    Time  4    Period  Weeks    Status  On-going    Target Date  02/23/18      PT LONG TERM GOAL #4   Title  Pt will improve gait velocity to >/= 1.0 ft/sec with use of appropriate AFO and LRAD with supervision    Baseline  .57 ft/sec    Time  4    Period  Weeks    Status  Revised    Target Date  02/23/18      PT LONG TERM GOAL #5   Title  Pt will negotiate 12 steps using handrail, with supervision, for improved safety and independence with stair negotiation for home.    Time  4    Period  Weeks    Status  On-going    Target Date   02/23/18      PT LONG TERM GOAL #6   Title  Pt will report 0/10 spinning/dizziness with rolling in bed and supine <> sit due to resolution of L BPPV    Status  Achieved      PT LONG TERM GOAL #7   Title  Pt will improve BERG balance score by 8 points to decrease risk for falls    Baseline  27/56    Time  4    Period  Weeks    Status  New    Target Date  02/23/18            Plan - 02/15/18 1519    Clinical Impression Statement  Pt reports dizziness improved since last therapy session.  Skilled session focused on gait with LRAD (quad cane and then quad tip cane) in order to improve postural control, improved R lateral weight shift and WB through RLE during stance.  Pt tolerated both devices very well.  Feel that we can continue with either device in clinic but would let primary PT make final decision regarding progression with devices.      Rehab Potential  Good    Clinical Impairments Affecting Rehab Potential  Good family support; severity of deficits    PT Frequency  2x / week    PT Duration  4 weeks    PT Treatment/Interventions  ADLs/Self Care Home Management;DME Instruction;Gait training;Stair training;Functional mobility training;Therapeutic activities;Therapeutic exercise;Balance training;Orthotic Fit/Training;Patient/family education;Neuromuscular re-education;Canalith Repostioning;Vestibular;Electrical Stimulation    PT Next  Visit Plan  CHECK BP AND MONITOR FOR ORTHOSTASIS.  Amy you can start LTG and recert needed by 8/16 Bufford Lope(Audra, I'll let you do the re-cert on 1/618/15 when you see him).  Gait with quad tip vs SBQC with spry step PLS AFO, rig a GiveMohr sling for RUE?? Treat L BPPV if needed/teach wife home maneuver with pillow.  Pt to receive AFO In 2 weeks from [facility].  gait training with hemiwalker, one step negotiation training to access downstairs bathroom, stair negotiation as able.  Add to standing balance HEP    Consulted and Agree with Plan of Care  Patient;Family  member/caregiver    Family Member Consulted  wife       Patient will benefit from skilled therapeutic intervention in order to improve the following deficits and impairments:  Abnormal gait, Decreased activity tolerance, Decreased balance, Decreased knowledge of precautions, Decreased endurance, Decreased knowledge of use of DME, Decreased mobility, Decreased safety awareness, Difficulty walking, Decreased strength, Impaired tone, Postural dysfunction, Dizziness  Visit Diagnosis: Muscle weakness (generalized)  Other abnormalities of gait and mobility  Unsteadiness on feet  Hemiplegia and hemiparesis following cerebral infarction affecting right dominant side (HCC)     Problem List There are no active problems to display for this patient.   Harriet ButteEmily Loida Calamia, PT, MPT [facility] [address] [place], [place], [address] Phone: [phone]   Fax:  [phone] 02/15/18, 3:26 PM  Name: Pennelope BrackenJames M Booton MRN: [id number] Date of Birth: 02/07/1956

## 2018-02-15 NOTE — Therapy (Signed)
[facility] [address] [place], [place], [address] Phone: [phone]   Fax:  [phone]  Occupational Therapy Treatment  Patient Details  Name: Tim Stephens MRN: [id number] Date of Birth: 03-30-1956 Referring Provider: Jenita Seashore, MD   Encounter Date: 02/15/2018  OT End of Session - 02/15/18 1524    Visit Number  14    Number of Visits  22    Date for OT Re-Evaluation  02/27/18    Authorization Type  BCBS    Authorization Time Period  pt has been approved for additional 8 visits by 02/12/2018 (total of 16)    Authorization - Visit Number  14    Authorization - Number of Visits  16    OT Start Time  1401    OT Stop Time  1447    OT Time Calculation (min)  46 min    Activity Tolerance  Patient tolerated treatment well       History reviewed. No pertinent past medical history.  History reviewed. No pertinent surgical history.  There were no vitals filed for this visit.  Subjective Assessment - 02/15/18 1406    Subjective   Yeah...I guess (smiling when using quad tip cane)    Patient is accompained by:  Family member   wife   Pertinent History  MONITOR BP!!!  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  No/denies                   OT Treatments/Exercises (OP) - 02/15/18 1513      ADLs   LB Dressing  Set up shoe buttons and after demonstration pt able to return demonstrate with only a gesture cue.      Functional Mobility  Practiced transfer tub bench transfers with quad tip cane - after demonstration , instuction and practice pt able to complete with min guard assist and mod cues.  Discussed with wife removing glass shower door and using tension rod with curtain as well as installing grab bar on shower wall.  Pt has hand held showerhead.  [name] only shower is on second floor of house (13 steps to access) - PT made aware.  Suggested that pt and wife perhaps use a friend's  shower 1x/wk that is on first floor if possible. Wife stated this may be an option.        Neurological Re-education Exercises   Other Exercises 1  Neuro re ed to address sit to stand, stand to sit and functional ambulation with quad tip cane.  Pt able to complete sit to stand and stand to sit without UE support with cueing. Pt needs faciliation for thoracic extension duirng ambulation as well as cues to fully engage RLE (partly due to impaired sensation)  BP remained stable today               OT Short Term Goals - 02/15/18 1521      OT SHORT TERM GOAL #1   Title  Pt and wife will be mod I with HEP for RUE ROM - 01/30/2018 (renewed)    Status  On-going      OT SHORT TERM GOAL #2   Title  Pt will be min a for grooming with mod cues from wife and intermittent set  up prn    Status  Achieved      OT SHORT TERM GOAL #3   Title  Pt will be mod a for UB bathing at wheelchair level with mod cues    Status  Achieved      OT SHORT TERM GOAL #4   Title  Pt will be mod a for LB bathing at sit to stand level  with mod cues and set up    Status  Achieved      OT SHORT TERM GOAL #5   Title  Pt will be max a for UB dressing at wheelchair level, mod cues    Status  Achieved      OT SHORT TERM GOAL #6   Title  Pt and wife will explore options for 3 in 1 commode for pt to use in bathroom or at bedside    Status  Achieved      OT SHORT TERM GOAL #7   Title  Pt will use RUE as stabilizer 25% of the time during basic self care skills with min a and cues.     Status  On-going        OT Long Term Goals - 02/15/18 1522      OT LONG TERM GOAL #1   Title  Pt and wife will be mod I with home activity program designed to increase independence in basic self care as well as address cognition - 02/27/2018 (renewed)    Status  On-going       OT LONG TERM GOAL #2   Title  Pt will be min a for UB bathing after set up with mod cues    Status  Achieved      OT LONG TERM GOAL #3   Title  Pt will be min a for UB dressing with mod cues    Status  Achieved      OT LONG TERM GOAL #4   Title  Pt will be mod a for LB bathing with mod cues    Status  On-going      OT LONG TERM GOAL #5   Title  Pt will be mod a for LB dressing with mod cues    Status  Achieved      OT LONG TERM GOAL #6   Title  Pt will be min a for toilet transfers in bathroom (able to ambulate 3 steps into bathroom with wife).      Status  On-going      OT LONG TERM GOAL #7   Title  Pt will be mod a for clothing mmgt with toileting    Status  On-going      OT LONG TERM GOAL #8   Title  Pt will demonstrate ability to use RUE as stabilizer during basic self care 50% of the time with min a and cues.     Status  On-going            Plan - 02/15/18 1522    Clinical Impression Statement  Pt progressing toward goals. Pt with improving medical stability demonstrated by more stable BP readings and able to tolerate more activity.  Pt with improving functional mobility and balance.     Occupational Profile and client history currently impacting functional performance  HLD, HTN, s/p craniotomy, IVC filter with Coumadin, seizures.    Occupational performance deficits (Please refer to evaluation for details):  ADL's;IADL's;Rest and Sleep;Work;Leisure;Social Participation    Rehab Potential  Good  Current Impairments/barriers affecting progress:  severity of deficits    OT Frequency  2x / week    OT Duration  8 weeks    OT Treatment/Interventions  Self-care/ADL training;Moist Heat;Therapeutic exercise;Neuromuscular education;DME and/or AE instruction;Manual Therapy;Functional Mobility Training;Passive range of motion;Therapeutic activities;Splinting;Patient/family education;Balance training    Plan  NMR trunk/RUE, sit to stand, stand to sit, balance, functional  mobility, possible use of Give Mohr sling for ambulation    Consulted and Agree with Plan of Care  Patient;Family member/caregiver    Family Member Consulted  [name]       Patient will benefit from skilled therapeutic intervention in order to improve the following deficits and impairments:  Abnormal gait, Decreased activity tolerance, Decreased balance, Decreased cognition, Decreased knowledge of use of DME, Decreased mobility, Decreased range of motion, Decreased safety awareness, Difficulty walking, Decreased strength, Impaired UE functional use, Impaired tone, Impaired sensation, Pain  Visit Diagnosis: Muscle weakness (generalized)  Unsteadiness on feet  Hemiplegia and hemiparesis following cerebral infarction affecting right dominant side (HCC)  Apraxia  Other symptoms and signs involving the nervous system  Other symptoms and signs involving cognitive functions following cerebral infarction  Other symptoms and signs involving the musculoskeletal system    Problem List There are no active problems to display for this patient.   Norton Pastelulaski, Karen Halliday, OTR/L 02/15/2018, 3:27 PM  [facility] [address] [place], [place], [address] Phone: [phone]   Fax:  [phone]  Name: Pennelope BrackenJames M Sokol MRN: [id number] Date of Birth: 01/04/1956

## 2018-02-16 NOTE — Therapy (Signed)
[facility] [address] [place], [place], [address] Phone: [phone]   Fax:  [phone]  Speech Language Pathology Treatment  Patient Details  Name: Tim Stephens MRN: [id number] Date of Birth: 1955-09-24 Referring Provider: Dr. Scarlette Ar   Encounter Date: 02/15/2018  End of Session - 02/15/18 1515    Visit Number  17    Number of Visits  21    Date for SLP Re-Evaluation  03/10/18    Authorization - Visit Number  25    Authorization - Number of Visits  26    Activity Tolerance  Patient tolerated treatment well       History reviewed. No pertinent past medical history.  History reviewed. No pertinent surgical history.  There were no vitals filed for this visit.  Subjective Assessment - 02/16/18 0828    Subjective  Pt did not perform suggested homework after last session.    Patient is accompained by:  Family member   wife [name]   Currently in Pain?  No/denies            ADULT SLP TREATMENT - 02/16/18 0001      General Information   Behavior/Cognition  Alert;Cooperative;Pleasant mood      Treatment Provided   Treatment provided  Cognitive-Linquistic      Cognitive-Linquistic Treatment   Treatment focused on  Aphasia;Apraxia    Skilled Treatment  Because of "s", SLP assisted pt/wife to customize Touch[employer] with longer utterances instead of words only. SLP worked with pt in "hunting" section as well as making pages out of restaurants. SLP gave pt/wife homework to put 2-3 things pt would enjoy on each restaurant page. Pt req'd SLP min A occasionally to provide answers to questions navigating to icons on the [employer]. Wife indicated pt will likely be client at [facility] for the fall. Their last visit is next week.       Assessment / Recommendations / Plan   Plan  Continue with current plan of care      Progression Toward Goals   Progression toward goals  Progressing toward goals         SLP  Short Term Goals - 02/13/18 1423      SLP SHORT TERM GOAL #1   Title  Pt will ID object f:5 to sentence description with 90% accuracy and occasional min A    Status  Not Met      SLP SHORT TERM GOAL #2   Title  Pt will complete automatic speech tasks with 70% accuracy and visual cues over 3 sessions    Status  Not Met      SLP SHORT TERM GOAL #3   Title  Pt will utilize multimodal communication and AAC to communicate wants and answer questions with 70% accuracy and occasional min A over 3 sessions    Status  Not Met      SLP SHORT TERM GOAL #4   Title  Pt will write/type at word level with 80% accuracy and occasional min A over 2 sessions    Status  Not Met       SLP Long Term Goals - 02/16/18 3785      SLP LONG TERM GOAL #1   Title  Pt will comprehend mildly complex conversation over 8 minute conversation with appropriate responses (mulitmodal) and 2 or less requests for repetition    Baseline  01/09/18    Status  Achieved      SLP LONG TERM GOAL #  2   Title  Pt will utilize multimodal communication/ACC to produce 2 word response to conversation/question 8/10x with occasional min A    Baseline  02/13/18;     Time  1    Period  Weeks    Status  On-going      SLP LONG TERM GOAL #3   Title  Pt will write 1 word  with 70% accuracy and occasional min A    Baseline  02/13/18    Time  2    Period  Weeks    Status  On-going      SLP LONG TERM GOAL #4   Title  Pt will produce 1-2 syllable words in phrase completion task with 70% accuracy and occassional mod A    Baseline  01/04/18    Time  1    Period  Weeks    Status  On-going       Plan - 02/16/18 0834    Clinical Impression Statement  Severe non fluent Broca's aphasia persists, causing persistent non -functional verbal communication of wants/needs and ideas. Pt cont to become more familiar with use of [employer] re: adding and modifying icons to customize device. See "skiled intervention" for more details. Pt verbal expression  improves when he has visual support of SLP/family's articulators. Continue skilled ST to maximize communication.     Speech Therapy Frequency  1x /week    Duration  --   8 weeks/17 total visits   Treatment/Interventions  Language facilitation;Environmental controls;Cueing hierarchy;Oral motor exercises;SLP instruction and feedback;Compensatory strategies;Functional tasks;Cognitive reorganization;Internal/external aids;Multimodal communcation approach;Patient/family education;Other (comment)    Potential to Achieve Goals  Good    Potential Considerations  Severity of impairments    Consulted and Agree with Plan of Care  Patient;Family member/caregiver       Patient will benefit from skilled therapeutic intervention in order to improve the following deficits and impairments:   Aphasia  Verbal apraxia    Problem List There are no active problems to display for this patient.   [facility] ,[place], [place]  02/16/2018, 8:36 AM  [facility] [address] [place], [place], [address] Phone: [phone]   Fax:  [phone]   Name: Tim Stephens MRN: [id number] Date of Birth: [birth year]

## 2018-02-20 ENCOUNTER — Ambulatory Visit: Payer: BLUE CROSS/BLUE SHIELD | Admitting: Occupational Therapy

## 2018-02-20 ENCOUNTER — Ambulatory Visit: Payer: BLUE CROSS/BLUE SHIELD | Admitting: Physical Therapy

## 2018-02-20 ENCOUNTER — Encounter: Payer: Self-pay | Admitting: Occupational Therapy

## 2018-02-20 ENCOUNTER — Encounter: Payer: Self-pay | Admitting: Speech Pathology

## 2018-02-20 ENCOUNTER — Encounter: Payer: Self-pay | Admitting: Physical Therapy

## 2018-02-20 ENCOUNTER — Ambulatory Visit: Payer: BLUE CROSS/BLUE SHIELD | Admitting: Speech Pathology

## 2018-02-20 DIAGNOSIS — R29898 Other symptoms and signs involving the musculoskeletal system: Secondary | ICD-10-CM

## 2018-02-20 DIAGNOSIS — I69351 Hemiplegia and hemiparesis following cerebral infarction affecting right dominant side: Secondary | ICD-10-CM

## 2018-02-20 DIAGNOSIS — R482 Apraxia: Secondary | ICD-10-CM

## 2018-02-20 DIAGNOSIS — I69318 Other symptoms and signs involving cognitive functions following cerebral infarction: Secondary | ICD-10-CM

## 2018-02-20 DIAGNOSIS — R29818 Other symptoms and signs involving the nervous system: Secondary | ICD-10-CM

## 2018-02-20 DIAGNOSIS — R42 Dizziness and giddiness: Secondary | ICD-10-CM | POA: Diagnosis not present

## 2018-02-20 DIAGNOSIS — R293 Abnormal posture: Secondary | ICD-10-CM

## 2018-02-20 DIAGNOSIS — R2681 Unsteadiness on feet: Secondary | ICD-10-CM

## 2018-02-20 DIAGNOSIS — R2689 Other abnormalities of gait and mobility: Secondary | ICD-10-CM

## 2018-02-20 DIAGNOSIS — R4701 Aphasia: Secondary | ICD-10-CM

## 2018-02-20 DIAGNOSIS — M6281 Muscle weakness (generalized): Secondary | ICD-10-CM

## 2018-02-20 NOTE — Patient Instructions (Signed)
New schedule - it is critical for Tim Stephens to be out of the bed and moving at this point in order to progress forward.  Here is what we agreed to: I do not want to waste his limited visits so this has to be happening at home for him to make progress.   Up at 9 am Walk to bathroom [place] and dress  - Tim Stephens should be doing this with Tim Stephens just standing by for safety and balance as needed. Walk to dining room and sit in regular chair.  Eat breakfast (Jessica to take chair to living room while you eat) Walk to living room and sit in wheelchair REST  Do PT exercises daily in the morning. Turn on some music while you exercise!! REST  Walk to dining room, sit in regular chair Eat lunch.  Walk to bedroom for afternoon rest NAP - no more than ONE HOUR total  Afternoons: Therapy appts, Use to run errands, have visitors, do some type of fun activity.  Make this a daily activity  REST Walk to dining room, eat dinner in regular chair (Jessica take wheelchair to living room) Walk to living room, watch tv, relax Walk to bedroom and head to bed around 8pm

## 2018-02-20 NOTE — Therapy (Signed)
[facility] [address] [place], [place], [address] Phone: [phone]   Fax:  [phone]  Occupational Therapy Treatment  Patient Details  Name: Tim Stephens MRN: [id number] Date of Birth: 22-Nov-1955 Referring Provider: Jenita Seashore, MD   Encounter Date: 02/20/2018  OT End of Session - 02/20/18 1602    Visit Number  15    Number of Visits  24    Date for OT Re-Evaluation  04/09/18    Authorization Type  BCBS    Authorization Time Period  pt has been approved for 8 additonal visits for OT by 04/09/2018 (24 total)    Authorization - Visit Number  15    Authorization - Number of Visits  24    OT Start Time  1448    OT Stop Time  1535    OT Time Calculation (min)  47 min    Activity Tolerance  Patient tolerated treatment well       History reviewed. No pertinent past medical history.  History reviewed. No pertinent surgical history.  There were no vitals filed for this visit.  Subjective Assessment - 02/20/18 1450    Subjective   Pt indicating he is feeling a bit of vertigo today (will notify PT)    Patient is accompained by:  Family member   [name]   Pertinent History  MONITOR BP!!!  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  No/denies                   OT Treatments/Exercises (OP) - 02/20/18 0001      ADLs   ADL Comments  Discussed with pt and wife what pt's typical day consists of. Wife reports that pt gets up mid day and walks to the bathroom for bathing and dressing then usually returns to bed for the remainder of the day unless he has therapy appts or doctor appts.  Very frank discussion with pt and wife to reinforce that pt has to get up and stay up for most of the day in order for his BP and  vestibular systems to regulate, to buld activity tolerance and to reach therapy goals.  Very specfic schedlule developed with pt and wife - see pt instruction section for details. Pt and wife agree to implement schedule this week.  Discussed that pt has limited visits and that if he is not following schedule at home he will not make the necessary gains he needs to make to maximize his benefit of therapy and reach his potential goals. Pt and wife both verbalized understanding.  Again discussed with pt possibility of depression - pt denies despite feelings of lack of interest, sleeping several hours a day, and at times feeling anxious. Pt and wife have referral information for neuropsychology and are aware of high incidence of depression in stroke survivors.  Will continue to monitor.  Wife also to call insurance to verify outpt visits vs home health therapy visits.               OT Education - 02/20/18 1559    Education provided  Yes    Education Details  Daily schedule for OOB, HEP and home activity program.      Person(s) Educated  Patient;Spouse    Methods  Explanation;Handout    Comprehension  Verbalized understanding       OT Short Term Goals - 02/20/18 1600      OT SHORT TERM GOAL #1   Title  Pt and wife will be mod I with HEP for RUE ROM - 01/30/2018 (renewed)    Status  On-going      OT SHORT TERM GOAL #2   Title  Pt will be min a for grooming with mod cues from wife and intermittent set up prn    Status  Achieved      OT SHORT TERM GOAL #3   Title  Pt will be mod a for UB bathing at wheelchair level with mod cues    Status  Achieved      OT SHORT TERM GOAL #4   Title  Pt will be mod a for LB bathing at sit to stand level  with mod cues and set up    Status  Achieved      OT SHORT TERM GOAL #5   Title  Pt will be max a for UB dressing at wheelchair level, mod cues    Status  Achieved      OT SHORT TERM GOAL #6   Title  Pt and wife will explore options for 3 in 1  commode for pt to use in bathroom or at bedside    Status  Achieved      OT SHORT TERM GOAL #7   Title  Pt will use RUE as stabilizer 25% of the time during basic self care skills with min a and cues.     Status  On-going        OT Long Term Goals - 02/20/18 1600      OT LONG TERM GOAL #1   Title  Pt and wife will be mod I with home activity program designed to increase independence in basic self care as well as address cognition - 02/27/2018 (renewed)    Status  On-going      OT LONG TERM GOAL #2   Title  Pt will be min a for UB bathing after set up with mod cues    Status  Achieved      OT LONG TERM GOAL #3   Title  Pt will be min a for UB dressing with mod cues    Status  Achieved      OT LONG TERM GOAL #4   Title  Pt will be mod a for LB bathing with mod cues    Status  On-going      OT LONG TERM GOAL #5   Title  Pt will be mod a for LB dressing with mod cues    Status  Achieved      OT LONG TERM GOAL #6   Title  Pt will be min a for toilet transfers in bathroom (able to ambulate 3 steps into bathroom with wife).      Status  Achieved      OT LONG TERM GOAL #7   Title  Pt will be mod a for clothing mmgt with toileting    Status  On-going      OT LONG TERM GOAL #8   Title  Pt will demonstrate ability to use RUE as stabilizer during basic self care 50% of the time with min a and cues.     Status  On-going            Plan - 02/20/18 1601  Clinical Impression Statement  Pt with slow progress toward goals. Pt with poor activity tolerance and continued intermittent vertigo - strong encouragement to follow scheduled developed today    Occupational Profile and client history currently impacting functional performance  HLD, HTN, s/p craniotomy, IVC filter with Coumadin, seizures.    Occupational performance deficits (Please refer to evaluation for details):  ADL's;IADL's;Rest and Sleep;Work;Leisure;Social Participation    Rehab Potential  Good    Current  Impairments/barriers affecting progress:  severity of deficits    OT Frequency  2x / week    OT Duration  8 weeks    OT Treatment/Interventions  Self-care/ADL training;Moist Heat;Therapeutic exercise;Neuromuscular education;DME and/or AE instruction;Manual Therapy;Functional Mobility Training;Passive range of motion;Therapeutic activities;Splinting;Patient/family education;Balance training    Plan  NMR trunk/RUE, sit to stand, stand to sit, balance, functional mobility, possible use of Give Mohr sling for ambulation    Consulted and Agree with Plan of Care  Patient    Family Member Consulted  [name]       Patient will benefit from skilled therapeutic intervention in order to improve the following deficits and impairments:  Abnormal gait, Decreased activity tolerance, Decreased balance, Decreased cognition, Decreased knowledge of use of DME, Decreased mobility, Decreased range of motion, Decreased safety awareness, Difficulty walking, Decreased strength, Impaired UE functional use, Impaired tone, Impaired sensation, Pain  Visit Diagnosis: Muscle weakness (generalized)  Unsteadiness on feet  Hemiplegia and hemiparesis following 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   Norton Pastelulaski, Karen Halliday, OTR/L 02/20/2018, 4:04 PM  [facility] [address] [place], [place], [address] Phone: [phone]   Fax:  [phone]  Name: Tim Stephens MRN: [id number] Date of Birth: 11/10/1955

## 2018-02-21 NOTE — Therapy (Signed)
[facility] [address] [place], [place], [address] Phone: [phone]   Fax:  [phone]  Physical Therapy Treatment  Patient Details  Name: Tim Stephens MRN: [id number] Date of Birth: 12-Aug-1955 Referring Provider: Scarlette Ar, MD   Encounter Date: 02/20/2018  PT End of Session - 02/21/18 1219    Visit Number  18    Number of Visits  24   8 more visits approved on 8/6   Date for PT Re-Evaluation  02/23/18    Authorization Type  [employer]- must request more visits and date extension     Authorization Time Period  8 more visist approved from 7/31 - 12/30 - can request more visits if needed    Authorization - Visit Number  18    Authorization - Number of Visits  24    PT Start Time  9675    PT Stop Time  1359    PT Time Calculation (min)  43 min    Activity Tolerance  Patient tolerated treatment well   BP 120/91 at beginning of session; HR 82 bpm   Behavior During Therapy  [facility] for tasks assessed/performed       History reviewed. No pertinent past medical history.  History reviewed. No pertinent surgical history.  There were no vitals filed for this visit.  Subjective Assessment - 02/20/18 1320    Subjective  Went to vascular doctor and he wanted to discontinue the Amlodipine.  Still dealing with the vertigo a little bit, but he should be okay today, per wif report.    Pertinent History  Skull flap surgery 11/02/17.  PMH significant for hypertension and hyperlipidemia who was admitted to [facility] on 05/30/2017 with a aphasia and right hemibody weakness. CTA was obtained and revealed left M1 cutoff; cerebral angiography with mechanical thrombectomy. The patient's acute hospital course was complicated by worsening cytotoxic edema as well areas of hemorrhagic conversion with left to right midline shift. He was taken to the OR on 06/01/2017 for decompressive left hemicraniectomy. [name] He was found to have a right peroneal,  right posterior tibial, right gastrocnemius,left basilic, and left axillary venous thrombi. He then developed totally occluding superficial thrombophlebitis of the left cephalic vein and right cephalic vein. Venous thrombus then progressed in the left upper arm to the left brachial vein. The patient was not anticoagulated due to recent hemorrhagic conversion of stroke. PEG was placed by Dr. [name] on 06/15/2017. There was progression of right lower extremity DVT from the posterior tibial vein to the right common femoral vein. Vascular surgery was consulted and placed an inferior vena cava filter on 06/18/2017. expressive aphasia, verbal apraxia; therapy at [facility] January 2019, then [facility] rehab for 4 weeks, then [facility] therapy PT, OT, speech discharged 09/21/17    Patient Stated Goals  Per wife:  to get strong enough to get upstairs, and use the bathroom on his own; trying to be more mobile outside the home-fishing, more recreational things    Currently in Pain?  No/denies                       [facility] Adult PT Treatment/Exercise - 02/21/18 1201      Transfers   Transfers  Sit to Stand;Stand to [employer] Transfers    Sit to Stand  5: Supervision;With upper extremity assist;From chair/3-in-1    Stand to Sit  5: Supervision;With upper extremity assist;To chair/3-in-1    Stand Pivot Transfers  5:  Supervision      Ambulation/Gait   Ambulation/Gait  Yes    Ambulation/Gait Assistance  4: Min assist    Ambulation/Gait Assistance Details  Cues provided for upright posture, for increase stance time on RLE to increase LLE step length    Ambulation Distance (Feet)  60 Feet   x 2, 25 ft   Assistive device  Small based quad cane    Gait Pattern  Decreased step length - left;Decreased stance time - right;Decreased stride length;Decreased dorsiflexion - right;Right genu recurvatum;Trunk flexed;Step-through pattern;Decreased weight shift to right    Ambulation Surface   Level;Indoor    Stairs  Yes    Stairs Assistance  4: Min assist    Stairs Assistance Details (indicate cue type and reason)  Initial cues for correct foot placement; stairs performed at end of session; pt fatigued after stair negotiation.    Stair Management Technique  One rail Left;Step to pattern;Forwards    Number of Stairs  4    Height of Stairs  6    Gait Comments  Gait and stair training today with Spry Step AFO and heelwedge on RLE      Standardized Balance Assessment   Standardized Balance Assessment  Berg Balance Test      Berg Balance Test   Sit to Stand  Able to stand  independently using hands    Standing Unsupported  Able to stand 2 minutes with supervision    Sitting with Back Unsupported but Feet Supported on Floor or Stool  Able to sit safely and securely 2 minutes    Stand to Sit  Controls descent by using hands    Transfers  Able to transfer safely, definite need of hands    Standing Unsupported with Eyes Closed  Able to stand 10 seconds with supervision    Standing Ubsupported with Feet Together  Needs help to attain position but able to stand for 30 seconds with feet together    From Standing, Reach Forward with Outstretched Arm  Reaches forward but needs supervision    From Standing Position, Pick up Object from Floor  Able to pick up shoe, needs supervision    From Standing Position, Turn to Look Behind Over each Shoulder  Turn sideways only but maintains balance    Turn 360 Degrees  Needs assistance while turning    Standing Unsupported, Alternately Place Feet on Step/Stool  Needs assistance to keep from falling or unable to try    Standing Unsupported, One Foot in Front  Needs help to step but can hold 15 seconds    Standing on One Leg  Unable to try or needs assist to prevent fall    Total Score  27      Self-Care   Self-Care  Other Self-Care Comments    Other Self-Care Comments   Discussed progress towards LTGs, discussed POC, including additional 8 visits  approved for PT, and with improved gait velocity, improved ability for gait training trials with AFO (awaiting AFO from orthotist) and medical issues allowing consistent therapy sessions, that pt would continue to benefit from further PT.  Pt and wife in agreement.             PT Education - 02/21/18 1948    Education provided  Yes    Education Details  goal check/goal progress, POC discussion-see self-care    Person(s) Educated  Patient;Spouse    Methods  Explanation    Comprehension  Verbalized understanding  PT Short Term Goals - 02/01/18 1724      PT SHORT TERM GOAL #1   Title  = LTG        PT Long Term Goals - 02/20/18 1344      PT LONG TERM GOAL #1   Title  Pt/wife will verbalize plans for continued community fitness upon d/c from PT.  Updated [employer] 02/23/18    Time  4   per recert 0/09/38   Period  Weeks    Status  On-going      PT LONG TERM GOAL #2   Title  Pt will perform sit to stand transfers modified independently, for improved safety and independence with transfers.    Baseline  Min guard assist 01/24/18; supervision 02/20/18    Time  4    Period  Weeks    Status  Not Met      PT LONG TERM GOAL #3   Title  Pt will ambulate at least 300 ft using least restrictive assistive device, with supervision, for improved safety and independence with gait.    Baseline  100 ft min assist 01/24/18    Time  4    Period  Weeks    Status  On-going      PT LONG TERM GOAL #4   Title  Pt will improve gait velocity to >/= 1.0 ft/sec with use of appropriate AFO and LRAD with supervision    Baseline  .51 ft/sec; 0.73 ft/sec 02/20/18    Time  4    Period  Weeks    Status  Not Met      PT LONG TERM GOAL #5   Title  Pt will negotiate 12 steps using handrail, with supervision, for improved safety and independence with stair negotiation for home.    Baseline  4 steps min assist 02/20/18    Time  4    Period  Weeks    Status  Not Met      PT LONG TERM GOAL #6   Title   Pt will report 0/10 spinning/dizziness with rolling in bed and supine <> sit due to resolution of L BPPV    Status  Achieved      PT LONG TERM GOAL #7   Title  Pt will improve BERG balance score by 8 points to decrease risk for falls    Baseline  27/56; remains at 27/56 02/20/18    Time  4    Period  Weeks    Status  Not Met            Plan - 02/21/18 1951    Clinical Impression Statement  Began assessing LTGs this visit, with LTG 2 not met, but progressing with pt supervision for sit<>stand transfers.  LTG 4 not met but progressing, with gait velocity improved from 0.57 ft/sec>0.73 ft/sec.  LTG 5 not met, but progressing with pt negotiating 4 steps with L rail with min assist.  LTG 7 not met, with Berg score remaining same at 27/56.  Pt has been assessed for Spry Fit orthotic and is awaiting that AFO; he has had several weeks of being medically stable and able to participate in full PT sessions; however, he has experienced some episodes of BPPV that have had to be addressed.  Pt has transitioned to gait training in clinic with small based quad cane and with rubber tip quad cane, and he would continue to benefit from further skilled PT to address strength, neuro-reeducation of RLE and gait  training to improve functional mobility and independence.    Rehab Potential  Good    Clinical Impairments Affecting Rehab Potential  Good family support; severity of deficits    PT Frequency  2x / week    PT Duration  4 weeks    PT Treatment/Interventions  ADLs/Self Care Home Management;DME Instruction;Gait training;Stair training;Functional mobility training;Therapeutic activities;Therapeutic exercise;Balance training;Orthotic Fit/Training;Patient/family education;Neuromuscular re-education;Canalith Repostioning;Vestibular;Electrical Stimulation    PT Next Visit Plan  CHECK BP AND MONITOR FOR ORTHOSTASIS.  Needs re-cert on 3/96 visit; will need to check remaining LTGs.  Gait with quad tip vs SBQC with spry  step PLS AFO, Treat L BPPV if needed/teach wife home maneuver with pillow.  Pt to receive AFO In 2 weeks from [place].  gait training with hemiwalker, one step negotiation training to access downstairs bathroom, stair negotiation as able.  Add to standing balance HEP   Check with pt/wife on how they are doing with OT's activity list for home   Consulted and Agree with Plan of Care  Patient;Family member/caregiver    Family Member Consulted  wife       Patient will benefit from skilled therapeutic intervention in order to improve the following deficits and impairments:  Abnormal gait, Decreased activity tolerance, Decreased balance, Decreased knowledge of precautions, Decreased endurance, Decreased knowledge of use of DME, Decreased mobility, Decreased safety awareness, Difficulty walking, Decreased strength, Impaired tone, Postural dysfunction, Dizziness  Visit Diagnosis: Unsteadiness on feet  Other abnormalities of gait and mobility     Problem List There are no active problems to display for this patient.   Chico Cawood W. 02/21/2018, 7:58 PM Frazier Butt., PT  [facility] [address] [place], [place], [address] Phone: [phone]   Fax:  [phone]  Name: Tim Stephens MRN: [id number] Date of Birth: 05/14/1956

## 2018-02-22 ENCOUNTER — Ambulatory Visit: Payer: BLUE CROSS/BLUE SHIELD | Admitting: Physical Therapy

## 2018-02-22 ENCOUNTER — Ambulatory Visit: Payer: BLUE CROSS/BLUE SHIELD

## 2018-02-22 ENCOUNTER — Encounter: Payer: BLUE CROSS/BLUE SHIELD | Admitting: Occupational Therapy

## 2018-02-22 DIAGNOSIS — R2681 Unsteadiness on feet: Secondary | ICD-10-CM

## 2018-02-22 DIAGNOSIS — R482 Apraxia: Secondary | ICD-10-CM

## 2018-02-22 DIAGNOSIS — R4701 Aphasia: Secondary | ICD-10-CM

## 2018-02-22 DIAGNOSIS — R293 Abnormal posture: Secondary | ICD-10-CM

## 2018-02-22 DIAGNOSIS — R42 Dizziness and giddiness: Secondary | ICD-10-CM | POA: Diagnosis not present

## 2018-02-22 DIAGNOSIS — R2689 Other abnormalities of gait and mobility: Secondary | ICD-10-CM

## 2018-02-22 DIAGNOSIS — M6281 Muscle weakness (generalized): Secondary | ICD-10-CM

## 2018-02-22 DIAGNOSIS — H8112 Benign paroxysmal vertigo, left ear: Secondary | ICD-10-CM

## 2018-02-22 NOTE — Patient Instructions (Signed)
Hey  How are you  Help  Who was it?  I need you  What's up?  I'm OK  I don't know

## 2018-02-22 NOTE — Therapy (Signed)
[facility] [address] [place], [place], [address] Phone: [phone]   Fax:  [phone]  Speech Language Pathology Treatment  Patient Details  Name: Tim Stephens MRN: [id number] Date of Birth: 29-Jul-1955 Referring Provider: Dr. Scarlette Ar   Encounter Date: 02/20/2018    History reviewed. No pertinent past medical history.  History reviewed. No pertinent surgical history.  There were no vitals filed for this visit.             SLP Short Term Goals - 02/20/18 1531      SLP SHORT TERM GOAL #1   Title  Pt will ID object f:5 to sentence description with 90% accuracy and occasional min A    Status  Not Met      SLP SHORT TERM GOAL #2   Title  Pt will complete automatic speech tasks with 70% accuracy and visual cues over 3 sessions    Status  Not Met      SLP SHORT TERM GOAL #3   Title  Pt will utilize multimodal communication and AAC to communicate wants and answer questions with 70% accuracy and occasional min A over 3 sessions    Status  Not Met      SLP SHORT TERM GOAL #4   Title  Pt will write/type at word level with 80% accuracy and occasional min A over 2 sessions    Status  Not Met       SLP Long Term Goals - 02/20/18 1531      SLP LONG TERM GOAL #1   Title  Pt will comprehend mildly complex conversation over 8 minute conversation with appropriate responses (mulitmodal) and 2 or less requests for repetition    Baseline  01/09/18    Status  Achieved      SLP LONG TERM GOAL #2   Title  Pt will utilize multimodal communication/ACC to produce 2 word response to conversation/question 8/10x with occasional min A    Baseline  02/13/18;     Time  1    Period  Weeks    Status  Achieved      SLP LONG TERM GOAL #3   Title  Pt will write 1 word  with 70% accuracy and occasional min A    Baseline  02/13/18    Time  2    Period  Weeks    Status  Not Met      SLP LONG TERM GOAL #4   Title  Pt  will produce 1-2 syllable words in phrase completion task with 70% accuracy and occassional mod A    Baseline  01/04/18    Time  1    Period  Weeks       Plan - 02/22/18 1317    Clinical Impression Statement  Severe non fluent Broca's aphasia persists, with non functional verbal communication of wants/needs. SGD device trial ended - device is shut down. Awaiting MD signature of insurance authorization form. Verbal expression at phrase level today with consistent mod to max A. Continue skilled ST to maxmize multimodal communication to improve independnece, reduce social isolation and reduce caregiver burden    Speech Therapy Frequency  2x / week    Treatment/Interventions  Language facilitation;Environmental controls;Cueing hierarchy;Oral motor exercises;SLP instruction and feedback;Compensatory strategies;Functional tasks;Cognitive reorganization;Internal/external aids;Multimodal communcation approach;Patient/family education;Other (comment)    Potential Considerations  Severity of impairments       Patient will benefit from skilled therapeutic intervention in order to improve the  following deficits and impairments:   Aphasia  Verbal apraxia    Problem List There are no active problems to display for this patient.   Edris Schneck, Annye Rusk MS, [place] 02/22/2018, 1:18 PM  [facility] [address] [place], [place], [address] Phone: [phone]   Fax:  [phone]   Name: PAUL TRETTIN MRN: [id number] Date of Birth: Aug 12, 1955

## 2018-02-22 NOTE — Therapy (Signed)
[facility] [address] [place], [place], [address] Phone: [phone]   Fax:  [phone]  Speech Language Pathology Treatment  Patient Details  Name: Tim Stephens MRN: [id number] Date of Birth: [birth year] Referring Provider: Dr. Scarlette Ar   Encounter Date: 02/22/2018  End of Session - 02/22/18 1655    Visit Number  18    Number of Visits  26   8 more visits auth'd - per wife as HHST did not count towards total   Date for SLP Re-Evaluation  04/09/18    Authorization Type  30 total ST visits, no auth.  Spouse/pt to leave 5 visits prior to end of year. Per wife on 02-22-18, HHST did NOT count towards OPST visits.    Authorization - Visit Number  18    Authorization - Number of Visits  25   (to save 5)   SLP Start Time  9476    SLP Stop Time   1445    SLP Time Calculation (min)  42 min    Activity Tolerance  Patient tolerated treatment well       History reviewed. No pertinent past medical history.  History reviewed. No pertinent surgical history.  There were no vitals filed for this visit.  Subjective Assessment - 02/22/18 1412    Subjective  Pt has 8 more visits authorized. will schedule 4 more in order to save 4 for later on in the year.    Patient is accompained by:  Family member    Currently in Pain?  No/denies            ADULT SLP TREATMENT - 02/22/18 1413      General Information   Behavior/Cognition  Alert;Cooperative;Pleasant mood      Treatment Provided   Treatment provided  Cognitive-Linquistic      Cognitive-Linquistic Treatment   Treatment focused on  Aphasia;Apraxia    Skilled Treatment  SLP, due to no device today, practiced common functional phrases with pt - SLP provided handout for pt. Spontaneously pt produced one phrase spontaneously, "I don't know". All others req'd max A including simultaneous SLP production. After 5-7 reps, pt's accuracy increased to near-functional 80% of  the time however perseveration from previous phrases was evident occasionally. Pt provided these 8 phrases/sentences to pt wife and educated her how to accomplish with pt regularly in hopes that pt will begin to say them routinely.       Assessment / Recommendations / Plan   Plan  Continue with current plan of care      Progression Toward Goals   Progression toward goals  Progressing toward goals       SLP Education - 02/22/18 1654    Education provided  Yes    Education Details  how to structure practice with comon phrases    Person(s) Educated  Patient;Spouse    Methods  Explanation;Demonstration    Comprehension  Verbalized understanding       SLP Short Term Goals - 02/20/18 1531      SLP SHORT TERM GOAL #1   Title  Pt will ID object f:5 to sentence description with 90% accuracy and occasional min A    Status  Not Met      SLP SHORT TERM GOAL #2   Title  Pt will complete automatic speech tasks with 70% accuracy and visual cues over 3 sessions    Status  Not Met      SLP SHORT TERM GOAL #3  Title  Pt will utilize multimodal communication and AAC to communicate wants and answer questions with 70% accuracy and occasional min A over 3 sessions    Status  Not Met      SLP SHORT TERM GOAL #4   Title  Pt will write/type at word level with 80% accuracy and occasional min A over 2 sessions    Status  Not Met       SLP Long Term Goals - 02/22/18 1701      SLP LONG TERM GOAL #1   Title  Pt will comprehend mildly complex conversation over 8 minute conversation with appropriate responses (mulitmodal) and 2 or less requests for repetition    Status  Achieved      SLP LONG TERM GOAL #2   Title  Pt will utilize multimodal communication/ACC to produce 2 word response to conversation/question 8/10x with occasional min A    Status  Achieved      SLP LONG TERM GOAL #3   Title  Pt will write 1 word  with 70% accuracy and occasional min A    Baseline  02/13/18    Time  2    Period   Weeks    Status  Not Met      SLP LONG TERM GOAL #4   Title  Pt will produce 1-2 syllable words in phrase completion task with 70% accuracy and occassional mod A    Baseline  01/04/18    Time  4    Period  Weeks    Status  On-going      SLP LONG TERM GOAL #5   Title  pt will use SGD to indicate needs, questions, and desires with rare min A    Time  4    Period  Weeks    Status  New      Additional Long Term Goals   Additional Long Term Goals  Yes      SLP LONG TERM GOAL #6   Title  pt will demo basic understanding of how to make new icons with SGD, with rare min A    Time  4    Period  Weeks    Status  New       Plan - 02/22/18 1658    Clinical Impression Statement  Severe non fluent Broca's aphasia persists, with non functional verbal communication of wants/needs. SGD device trial ended - device is to be sent to pt after insurance authorization. Verbal expression at phrase/sentence level today with consistent max A. Continue skilled ST for approx 8 more sessions to maxmize multimodal communication to improve independnece, reduce social isolation and reduce caregiver burden    Speech Therapy Frequency  2x / week    Duration  4 weeks   or 8 more sessions beginning week of 02-26-18   Treatment/Interventions  Language facilitation;Environmental controls;Cueing hierarchy;Oral motor exercises;SLP instruction and feedback;Compensatory strategies;Functional tasks;Cognitive reorganization;Internal/external aids;Multimodal communcation approach;Patient/family education;Other (comment)    Potential to Achieve Goals  Good    Potential Considerations  Severity of impairments    Consulted and Agree with Plan of Care  Patient;Family member/caregiver    Family Member Consulted  wife [name]       Patient will benefit from skilled therapeutic intervention in order to improve the following deficits and impairments:   Aphasia  Verbal apraxia    Problem List There are no active problems to  display for this patient.   [facility] ,[place], [place]  02/22/2018, 5:05 PM  [facility] [address] [place], [place], [address] Phone: [phone]   Fax:  [phone]   Name: Tim Stephens MRN: [id number] Date of Birth: 1956/01/07

## 2018-02-23 NOTE — Therapy (Signed)
[facility] [address] [place], [place], [address] Phone: [phone]   Fax:  [phone]  Physical Therapy Treatment  Patient Details  Name: BRIGHT SPIELMANN MRN: [id number] Date of Birth: 1955/08/23 Referring Provider: Scarlette Ar, MD   Encounter Date: 02/22/2018  PT End of Session - 02/23/18 1500    Visit Number  19    Number of Visits  24   8 more visits approved on 8/6   Date for PT Re-Evaluation  01/74/94   recert 2x/wk for 8 weeks on 02/22/18   Authorization Type  [employer]- must request more visits and date extension     Authorization Time Period  8 more visist approved from 7/31 - 12/30 - can request more visits if needed    Authorization - Visit Number  19    Authorization - Number of Visits  24    PT Start Time  4967    PT Stop Time  1402    PT Time Calculation (min)  50 min    Activity Tolerance  Patient tolerated treatment well;Patient limited by fatigue   BP 132/89 at beginning of session; c/o remaining vertigo addressed by Misty Stanley, PT   Behavior During Therapy  [facility] for tasks assessed/performed       No past medical history on file.  No past surgical history on file.  There were no vitals filed for this visit.  Subjective Assessment - 02/23/18 1448    Subjective  Per wife, have had a busy day today-had appointment with hematologist in [place] already today.  Pt and wife verified that he performed exercises yesterday and was sitting up as noted in OT activity schedule for home.  Still reports some feelings of vertigo.    Pertinent History  Skull flap surgery 11/02/17.  PMH significant for hypertension and hyperlipidemia who was admitted to [facility] on 05/30/2017 with a aphasia and right hemibody weakness. CTA was obtained and revealed left M1 cutoff; cerebral angiography with mechanical thrombectomy. The patient's acute hospital course was complicated by worsening cytotoxic edema as well areas of  hemorrhagic conversion with left to right midline shift. He was taken to the OR on 06/01/2017 for decompressive left hemicraniectomy. [name] He was found to have a right peroneal, right posterior tibial, right gastrocnemius,left basilic, and left axillary venous thrombi. He then developed totally occluding superficial thrombophlebitis of the left cephalic vein and right cephalic vein. Venous thrombus then progressed in the left upper arm to the left brachial vein. The patient was not anticoagulated due to recent hemorrhagic conversion of stroke. PEG was placed by Dr. [name] on 06/15/2017. There was progression of right lower extremity DVT from the posterior tibial vein to the right common femoral vein. Vascular surgery was consulted and placed an inferior vena cava filter on 06/18/2017. expressive aphasia, verbal apraxia; therapy at [facility] January 2019, then [facility] rehab for 4 weeks, then [facility] therapy PT, OT, speech discharged 09/21/17    Patient Stated Goals  Per wife:  to get strong enough to get upstairs, and use the bathroom on his own; trying to be more mobile outside the home-fishing, more recreational things    Currently in Pain?  No/denies             Vestibular Assessment - 02/22/18 1600      Dix-Hallpike Left   Dix-Hallpike Left Duration  10 seconds    Dix-Hallpike Left Symptoms  Upbeat, left rotatory nystagmus  [place] [facility] Adult PT Treatment/Exercise - 02/23/18 0001      Transfers   Transfers  Sit to Stand;Stand to Sit;Stand Pivot Transfers    Sit to Stand  5: Supervision;With upper extremity assist;From chair/3-in-1    Stand to Sit  5: Supervision;With upper extremity assist;To chair/3-in-1    Stand Pivot Transfers  5: Supervision      Ambulation/Gait   Ambulation/Gait  Yes    Ambulation/Gait Assistance  4: Min assist    Ambulation/Gait Assistance Details  Cues provided for upright posture, for increase R foot clearance, step length     Ambulation Distance (Feet)  115 Feet   Pt requests to stop due to fatigue after 1 lap in gym   Assistive device  Small based quad cane    Gait Pattern  Decreased step length - left;Decreased stance time - right;Decreased stride length;Decreased dorsiflexion - right;Right genu recurvatum;Trunk flexed;Step-through pattern;Decreased weight shift to right    Ambulation Surface  Level;Indoor    Stairs  Yes    Stairs Assistance  4: Min assist    Stair Management Technique  One rail Left;Step to pattern;Forwards    Number of Stairs  4    Height of Stairs  6    Curb  3: Mod assist    Curb Details (indicate cue type and reason)  Performed 2 reps with trial of SBQC (as pt/wife report he is doing this at home with hemi-walker).  For step up onto curb step simulating into bathroom area at home:  [facility] and LLE to ascend and [facility] and RLE to descend.  Pt has difficulty with R foot clearance stepping down off of step.  Educated pt/wife that he will still need to perform with hemiwalker at home and will conitnue to practice with lesser assistive device in therapy sessions until safe to transition to lesser assistive device at home.    Pre-Gait Activities  Gait activities today with SpryStep AFO on RLE with heelwedge.  Had to make adjustments x 2, due to pt's heel not well seated in shoe.  Additional gait 20 ft, then 10 ft with [facility]    Gait Comments  With gait training today, pt noted to have decreased R foot clearance and decreased R step length last 40 ft of gait, with pt reporting fatigue.      Vestibular Treatment/Exercise - 02/22/18 1600      Vestibular Treatment/Exercise   Vestibular Treatment Provided  Canalith Repositioning    Canalith Repositioning  Epley Manuever Left       EPLEY MANUEVER LEFT   Number of Reps   2    Overall Response   Improved Symptoms     RESPONSE DETAILS LEFT  decreased intensity of nystagmus second assessment              PT Short Term Goals - 02/01/18 1724      PT  SHORT TERM GOAL #1   Title  = LTG        PT Long Term Goals - 02/22/18 1328      PT LONG TERM GOAL #1   Title  Pt/wife will verbalize plans for continued community fitness upon d/c from PT.  Updated [employer] 02/23/18    Baseline  Will continue to address as pt nears d/c    Time  4   per recert 8/52/77   Period  Weeks    Status  Not Met      PT LONG TERM GOAL #2   Title  Pt  will perform sit to stand transfers modified independently, for improved safety and independence with transfers.    Baseline  Min guard assist 01/24/18; supervision 02/20/18    Time  4    Period  Weeks    Status  Not Met      PT LONG TERM GOAL #3   Title  Pt will ambulate at least 300 ft using least restrictive assistive device, with supervision, for improved safety and independence with gait.    Baseline  100 ft min assist 01/24/18; 115 ft 02/22/18 with min assist with [facility]    Time  4    Period  Weeks    Status  Not Met      PT LONG TERM GOAL #4   Title  Pt will improve gait velocity to >/= 1.0 ft/sec with use of appropriate AFO and LRAD with supervision    Baseline  .70 ft/sec; 0.73 ft/sec 02/20/18    Time  4    Period  Weeks    Status  Not Met      PT LONG TERM GOAL #5   Title  Pt will negotiate 12 steps using handrail, with supervision, for improved safety and independence with stair negotiation for home.    Baseline  4 steps min assist 02/20/18    Time  4    Period  Weeks    Status  Not Met      PT LONG TERM GOAL #6   Title  Pt will report 0/10 spinning/dizziness with rolling in bed and supine <> sit due to resolution of L BPPV    Status  Achieved      PT LONG TERM GOAL #7   Title  Pt will improve BERG balance score by 8 points to decrease risk for falls    Baseline  27/56; remains at 27/56 02/20/18    Time  4    Period  Weeks    Status  Not Met            Plan - 02/23/18 1502    Clinical Impression Statement  Assessed remaining goals:  LTG 1 for continued community fitness not met, as pt  not yet ready to transition to community fitness options; LTG 3 not met for long distance gait, as pt is ambulating 115 ft with SBQC and minimal assistance with trial SpryStep AFO.  Pt is actively transitioning with gait training with [facility] and has trialed rubber tip quad cane in therapy sessions.  He is using trial Spry Step AFO and is awaiting his AFO delivery.  He continues to be motivated to participate in therapy sessions and has had several weeks of being medically stable to fully participate in PT sessions.  He conitnues to have residual BPPV, which is addressed as needed (including today's visit).  In conjunction with OT, pt/wife have been educated in more formal activity schedule to encourage pt's being out of bed at home (a limiting mobility factor for patient has been that he has spent much time in bed at home) and pt/wife have verbalized participation in this activity schedule.  Patient continues to beneift from skilled physical therapy to further address balance, strength, RLE neurp-reeducation, gait training iwth AFO and lesser assistive device for improved functional mobility in home.      Rehab Potential  Good    Clinical Impairments Affecting Rehab Potential  Good family support; severity of deficits    PT Frequency  2x / week    PT Duration  8  weeks   per recert 12/21/22   PT Treatment/Interventions  ADLs/Self Care Home Management;DME Instruction;Gait training;Stair training;Functional mobility training;Therapeutic activities;Therapeutic exercise;Balance training;Orthotic Fit/Training;Patient/family education;Neuromuscular re-education;Canalith Repostioning;Vestibular;Electrical Stimulation    PT Next Visit Plan  CHECK BP AND MONITOR FOR ORTHOSTASIS.  Gait with quad tip vs SBQC with spry step PLS AFO, Treat L BPPV if needed/teach wife home maneuver with pillow.  Pt to receive AFO In <2 weeks from [facility].  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   Add to standing balance HEP to encourage RLE weightbearing.   Check with pt/wife on how they are doing with OT's activity list for home   Consulted and Agree with Plan of Care  Patient;Family member/caregiver    Family Member Consulted  wife       Patient will benefit from skilled therapeutic intervention in order to improve the following deficits and impairments:  Abnormal gait, Decreased activity tolerance, Decreased balance, Decreased knowledge of precautions, Decreased endurance, Decreased knowledge of use of DME, Decreased mobility, Decreased safety awareness, Difficulty walking, Decreased strength, Impaired tone, Postural dysfunction, Dizziness  Visit Diagnosis: BPPV (benign paroxysmal positional vertigo), left  Dizziness and giddiness  Other abnormalities of gait and mobility  Abnormal posture  Muscle weakness (generalized)  Unsteadiness on feet     Problem List There are no active problems to display for this patient.   Manan Olmo W. 02/23/2018, 3:13 PM Frazier Butt., PT  [facility] [address] [place], [place], [address] Phone: [phone]   Fax:  [phone]  Name: EMBRY HUSS MRN: [id number] Date of Birth: 1956/06/11

## 2018-02-23 NOTE — Therapy (Deleted)
[facility] [address] [place], [place], [address] Phone: [phone]   Fax:  [phone]  Physical Therapy Treatment  Patient Details  Name: Tim Stephens MRN: [id number] Date of Birth: [birth year] Referring Provider: Scarlette Ar, MD   Encounter Date: 02/22/2018    No past medical history on file.  No past surgical history on file.  There were no vitals filed for this visit.          Vestibular Assessment - 02/22/18 1600      Dix-Hallpike Left   Dix-Hallpike Left Duration  10 seconds    Dix-Hallpike Left Symptoms  Upbeat, left rotatory nystagmus                Vestibular Treatment/Exercise - 02/22/18 1600      Vestibular Treatment/Exercise   Vestibular Treatment Provided  Canalith Repositioning    Canalith Repositioning  Epley Manuever Left       EPLEY MANUEVER LEFT   Number of Reps   2    Overall Response   Improved Symptoms     RESPONSE DETAILS LEFT  decreased intensity of nystagmus second assessment              PT Short Term Goals - 02/01/18 1724      PT SHORT TERM GOAL #1   Title  = LTG        PT Long Term Goals - 02/22/18 1328      PT LONG TERM GOAL #1   Title  Pt/wife will verbalize plans for continued community fitness upon d/c from PT.  Updated [employer] 02/23/18    Time  4   per recert 8/50/27   Period  Weeks    Status  On-going      PT LONG TERM GOAL #2   Title  Pt will perform sit to stand transfers modified independently, for improved safety and independence with transfers.    Baseline  Min guard assist 01/24/18; supervision 02/20/18    Time  4    Period  Weeks    Status  Not Met      PT LONG TERM GOAL #3   Title  Pt will ambulate at least 300 ft using least restrictive assistive device, with supervision, for improved safety and independence with gait.    Baseline  100 ft min assist 01/24/18    Time  4    Period  Weeks    Status  Not Met      PT LONG  TERM GOAL #4   Title  Pt will improve gait velocity to >/= 1.0 ft/sec with use of appropriate AFO and LRAD with supervision    Baseline  .15 ft/sec; 0.73 ft/sec 02/20/18    Time  4    Period  Weeks    Status  Not Met      PT LONG TERM GOAL #5   Title  Pt will negotiate 12 steps using handrail, with supervision, for improved safety and independence with stair negotiation for home.    Baseline  4 steps min assist 02/20/18    Time  4    Period  Weeks    Status  Not Met      PT LONG TERM GOAL #6   Title  Pt will report 0/10 spinning/dizziness with rolling in bed and supine <> sit due to resolution of L BPPV    Status  Achieved      PT LONG TERM GOAL #7   Title  Pt will improve BERG balance score by 8 points to decrease risk for falls    Baseline  27/56; remains at 27/56 02/20/18    Time  4    Period  Weeks    Status  Not Met              Patient will benefit from skilled therapeutic intervention in order to improve the following deficits and impairments:     Visit Diagnosis: BPPV (benign paroxysmal positional vertigo), left  Dizziness and giddiness     Problem List There are no active problems to display for this patient.   Rico Junker 02/23/2018, 8:08 AM  [facility] [address] [place], [place], [address] Phone: [phone]   Fax:  [phone]  Name: Tim Stephens MRN: [id number] Date of Birth: [birth year]

## 2018-02-27 ENCOUNTER — Ambulatory Visit: Payer: BLUE CROSS/BLUE SHIELD | Admitting: Physical Therapy

## 2018-02-27 ENCOUNTER — Encounter: Payer: Self-pay | Admitting: Physical Therapy

## 2018-02-27 ENCOUNTER — Encounter: Payer: Self-pay | Admitting: Occupational Therapy

## 2018-02-27 ENCOUNTER — Ambulatory Visit: Payer: BLUE CROSS/BLUE SHIELD | Admitting: Speech Pathology

## 2018-02-27 ENCOUNTER — Ambulatory Visit: Payer: BLUE CROSS/BLUE SHIELD | Admitting: Occupational Therapy

## 2018-02-27 ENCOUNTER — Encounter: Payer: Self-pay | Admitting: Speech Pathology

## 2018-02-27 VITALS — BP 129/91

## 2018-02-27 DIAGNOSIS — R29898 Other symptoms and signs involving the musculoskeletal system: Secondary | ICD-10-CM

## 2018-02-27 DIAGNOSIS — I69351 Hemiplegia and hemiparesis following cerebral infarction affecting right dominant side: Secondary | ICD-10-CM

## 2018-02-27 DIAGNOSIS — R42 Dizziness and giddiness: Secondary | ICD-10-CM | POA: Diagnosis not present

## 2018-02-27 DIAGNOSIS — R482 Apraxia: Secondary | ICD-10-CM

## 2018-02-27 DIAGNOSIS — I69318 Other symptoms and signs involving cognitive functions following cerebral infarction: Secondary | ICD-10-CM

## 2018-02-27 DIAGNOSIS — R2681 Unsteadiness on feet: Secondary | ICD-10-CM

## 2018-02-27 DIAGNOSIS — R2689 Other abnormalities of gait and mobility: Secondary | ICD-10-CM

## 2018-02-27 DIAGNOSIS — M6281 Muscle weakness (generalized): Secondary | ICD-10-CM

## 2018-02-27 DIAGNOSIS — R29818 Other symptoms and signs involving the nervous system: Secondary | ICD-10-CM

## 2018-02-27 DIAGNOSIS — R4701 Aphasia: Secondary | ICD-10-CM

## 2018-02-27 NOTE — Therapy (Signed)
[facility] [address] [place], [place], [address] Phone: [phone]   Fax:  [phone]  Speech Language Pathology Treatment  Patient Details  Name: Tim Stephens MRN: [id number] Date of Birth: 02-28-1956 Referring Provider: Dr. Scarlette Ar   Encounter Date: 02/27/2018  End of Session - 02/27/18 1515    Visit Number  19    Number of Visits  26    Date for SLP Re-Evaluation  03/10/18    Authorization - Visit Number  56    Authorization - Number of Visits  25    SLP Start Time  9163    SLP Stop Time   8466    SLP Time Calculation (min)  42 min    Activity Tolerance  Patient tolerated treatment well       History reviewed. No pertinent past medical history.  History reviewed. No pertinent surgical history.  There were no vitals filed for this visit.  Subjective Assessment - 02/27/18 1410    Subjective  "He was invited to go dove hunting, but he won't go"    Patient is accompained by:  Family member    Currently in Pain?  No/denies            ADULT SLP TREATMENT - 02/27/18 1415      General Information   Behavior/Cognition  Alert;Cooperative;Pleasant mood      Treatment Provided   Treatment provided  Cognitive-Linquistic      Cognitive-Linquistic Treatment   Treatment focused on  Aphasia;Apraxia    Skilled Treatment  SGD has not yet arrived. Facilitated phrase production with carrier phrases, mod to max visual, verbal cues in unison for simple phrase and social greetings completion. Pt attempted to communicate with spouse and ST without SGD with much frustration. Gestural communcation for "drink" when pt wanted to go out to eat. Unable to write name of restaurant, with much questioning cues by ST, spouse was able to ID that pt wanted to go to 5 guys. Spouse requesting to go to the beach and a friends party. Facilitated (with questioning cues) that pt did not want to go to the beach on Labor day or a  party b/c of crowds. But he would be willing to go over a friends house with just 1 other couple and the beach after Labor Day, Max A      Assessment / Recommendations / Plan   Plan  Continue with current plan of care      Progression Toward Goals   Progression toward goals  Progressing toward goals         SLP Short Term Goals - 02/27/18 1514      SLP SHORT TERM GOAL #1   Title  Pt will ID object f:5 to sentence description with 90% accuracy and occasional min A    Status  Not Met      SLP SHORT TERM GOAL #2   Title  Pt will complete automatic speech tasks with 70% accuracy and visual cues over 3 sessions    Status  Not Met      SLP SHORT TERM GOAL #3   Title  Pt will utilize multimodal communication and AAC to communicate wants and answer questions with 70% accuracy and occasional min A over 3 sessions    Status  Not Met      SLP SHORT TERM GOAL #4   Title  Pt will write/type at word level with 80% accuracy and occasional min A over  2 sessions    Status  Not Met       SLP Long Term Goals - 02/27/18 1514      SLP LONG TERM GOAL #1   Title  Pt will comprehend mildly complex conversation over 8 minute conversation with appropriate responses (mulitmodal) and 2 or less requests for repetition    Status  Achieved      SLP LONG TERM GOAL #2   Title  Pt will utilize multimodal communication/ACC to produce 2 word response to conversation/question 8/10x with occasional min A    Status  Achieved      SLP LONG TERM GOAL #3   Title  Pt will write 1 word  with 70% accuracy and occasional min A    Baseline  02/13/18    Time  2    Period  Weeks    Status  Not Met      SLP LONG TERM GOAL #4   Title  Pt will produce 1-2 syllable words in phrase completion task with 70% accuracy and occassional mod A    Baseline  01/04/18    Time  3    Period  Weeks    Status  On-going      SLP LONG TERM GOAL #5   Title  pt will use SGD to indicate needs, questions, and desires with rare min A     Time  3    Period  Weeks    Status  New      SLP LONG TERM GOAL #6   Title  pt will demo basic understanding of how to make new icons with SGD, with rare min A    Time  4    Period  Weeks    Status  New       Plan - 02/27/18 1513    Clinical Impression Statement  Severe non fluent Broca's aphasia persists, with non functional verbal communication of wants/needs. SGD device trial ended - device is to be sent to pt after insurance authorization.  Verbal expression at phrase/sentence level today with consistent max A. Continue skilled ST for approx 8 more sessions to maxmize multimodal communication to improve independnece, reduce social isolation and reduce caregiver burden    Speech Therapy Frequency  2x / week    Treatment/Interventions  Language facilitation;Environmental controls;Cueing hierarchy;Oral motor exercises;SLP instruction and feedback;Compensatory strategies;Functional tasks;Cognitive reorganization;Internal/external aids;Multimodal communcation approach;Patient/family education;Other (comment)    Potential to Achieve Goals  Good    Potential Considerations  Severity of impairments    Consulted and Agree with Plan of Care  Patient;Family member/caregiver    Family Member Consulted  wife [name]       Patient will benefit from skilled therapeutic intervention in order to improve the following deficits and impairments:   Aphasia  Verbal apraxia    Problem List There are no active problems to display for this patient.   Norene Oliveri, Annye Rusk MS, CCC-SLP 02/27/2018, 3:16 PM  [facility] [address] [place], [place], [address] Phone: [phone]   Fax:  [phone]   Name: Tim Stephens MRN: [id number] Date of Birth: 07/13/1955

## 2018-02-27 NOTE — Therapy (Signed)
[facility] [address] [place], [place], [address] Phone: [phone]   Fax:  [phone]  Occupational Therapy Treatment  Patient Details  Name: Tim Stephens MRN: [id number] Date of Birth: 11/10/1955 Referring Provider: Jenita SeashoreSamuel Kelly, MD   Encounter Date: 02/27/2018  OT End of Session - 02/27/18 1707    Visit Number  16    Number of Visits  24    Date for OT Re-Evaluation  04/09/18    Authorization Type  BCBS    Authorization Time Period  pt has been approved for 8 additonal visits for OT by 04/09/2018 (24 total)    Authorization - Visit Number  16    Authorization - Number of Visits  24    OT Start Time  1455    OT Stop Time  1535    OT Time Calculation (min)  40 min    Activity Tolerance  Patient tolerated treatment well    Behavior During Therapy  [facility] for tasks assessed/performed       No past medical history on file.  No past surgical history on file.  Vitals:   02/27/18 1455 02/27/18 1508 02/27/18 1509 02/27/18 1533  BP: 115/88 (!) 131/95 100/82 (!) 129/91    Subjective Assessment - 02/27/18 1506    Pertinent History  MONITOR BP!!!  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  No/denies             Treatment: functional activities in standing while weightbearing through RUE while reaching with LUE, min-mod facilitation/ v.c. Therapist monitored BP throughout session. Pt demonstrated improved standing tolerance when distracted with activity. [name] wife plans to attempt at home. Seated functional grasp/ release of cylindrical objects, mod facilitation.                OT Short Term Goals - 02/20/18 1600      OT SHORT TERM GOAL #1   Title  Pt and wife will be mod I with HEP for RUE ROM -  01/30/2018 (renewed)    Status  On-going      OT SHORT TERM GOAL #2   Title  Pt will be min a for grooming with mod cues from wife and intermittent set up prn    Status  Achieved      OT SHORT TERM GOAL #3   Title  Pt will be mod a for UB bathing at wheelchair level with mod cues    Status  Achieved      OT SHORT TERM GOAL #4   Title  Pt will be mod a for LB bathing at sit to stand level  with mod cues and set up    Status  Achieved      OT SHORT TERM GOAL #5   Title  Pt will be max a for UB dressing at wheelchair level, mod cues    Status  Achieved      OT SHORT TERM GOAL #6   Title  Pt and wife will explore options for 3 in 1 commode for pt to use in bathroom or at bedside    Status  Achieved      OT SHORT TERM GOAL #7   Title  Pt will use RUE as stabilizer 25% of the time during  basic self care skills with min a and cues.     Status  On-going        OT Long Term Goals - 02/20/18 1600      OT LONG TERM GOAL #1   Title  Pt and wife will be mod I with home activity program designed to increase independence in basic self care as well as address cognition - 02/27/2018 (renewed)    Status  On-going      OT LONG TERM GOAL #2   Title  Pt will be min a for UB bathing after set up with mod cues    Status  Achieved      OT LONG TERM GOAL #3   Title  Pt will be min a for UB dressing with mod cues    Status  Achieved      OT LONG TERM GOAL #4   Title  Pt will be mod a for LB bathing with mod cues    Status  On-going      OT LONG TERM GOAL #5   Title  Pt will be mod a for LB dressing with mod cues    Status  Achieved      OT LONG TERM GOAL #6   Title  Pt will be min a for toilet transfers in bathroom (able to ambulate 3 steps into bathroom with wife).      Status  Achieved      OT LONG TERM GOAL #7   Title  Pt will be mod a for clothing mmgt with toileting    Status  On-going      OT LONG TERM GOAL #8   Title  Pt will demonstrate ability to use RUE as stabilizer during  basic self care 50% of the time with min a and cues.     Status  On-going            Plan - 02/27/18 1714    Clinical Impression Statement  Pt is progressing towards goals. [name] wife report pt activity level is improving with his schedule, pt was more active this weekend.    Occupational performance deficits (Please refer to evaluation for details):  ADL's;IADL's;Rest and Sleep;Work;Leisure;Social Participation    Rehab Potential  Good    OT Frequency  2x / week    OT Duration  8 weeks    OT Treatment/Interventions  Self-care/ADL training;Moist Heat;Therapeutic exercise;Neuromuscular education;DME and/or AE instruction;Manual Therapy;Functional Mobility Training;Passive range of motion;Therapeutic activities;Splinting;Patient/family education;Balance training    Plan  NMR trunk/RUE, sit to stand, stand to sit, balance, functional mobility, possible use of Give Mohr sling for ambulation    Consulted and Agree with Plan of Care  Patient    Family Member Consulted  [name]       Patient will benefit from skilled therapeutic intervention in order to improve the following deficits and impairments:  Abnormal gait, Decreased activity tolerance, Decreased balance, Decreased cognition, Decreased knowledge of use of DME, Decreased mobility, Decreased range of motion, Decreased safety awareness, Difficulty walking, Decreased strength, Impaired UE functional use, Impaired tone, Impaired sensation, Pain  Visit Diagnosis: Muscle weakness (generalized)  Hemiplegia and hemiparesis following cerebral infarction affecting right dominant side (HCC)  Other symptoms and signs involving the nervous system  Other symptoms and signs involving cognitive functions following cerebral infarction  Other symptoms and signs involving the musculoskeletal system  Unsteadiness on feet  Other abnormalities of gait and mobility    Problem List There are no active problems  to display for this  patient.   [name] 02/27/2018, 5:16 PM  [facility] [address] [place], [place], [address] Phone: [phone]   Fax:  [phone]  Name: Tim Stephens MRN: [id number] Date of Birth: 02/07/1956

## 2018-02-28 ENCOUNTER — Encounter: Payer: Self-pay | Admitting: Physical Therapy

## 2018-02-28 NOTE — Patient Instructions (Addendum)
  Access Code: [email or web address]  URL: [email or web address]  Date: 02/28/2018  Prepared by: Lonia BloodAmy Barabara Motz   Exercises  Single Leg Stance with Support - 10 reps - 2 sets - 3 second hold - 1x daily - 5x weekly  Standing Hip Abduction - 10 reps - 2 sets - 1x daily - 5x weekly  Seated Heel Slide - 10 reps - 1 sets - 5 sec hold - 2x daily - 7x weekly

## 2018-02-28 NOTE — Therapy (Signed)
[facility] [address] [place], [place], [address] Phone: [phone]   Fax:  [phone]  Physical Therapy Treatment  Patient Details  Name: Tim Stephens MRN: [id number] Date of Birth: 07/21/1955 Referring Provider: Jenita SeashoreSamuel Kelly, MD   Encounter Date: 02/27/2018  PT End of Session - 02/28/18 2051    Visit Number  20    Number of Visits  24   8 more visits approved on 8/6   Date for PT Re-Evaluation  04/23/18   recert 2x/wk for 8 weeks on 02/22/18   Authorization Type  BCBS- must request more visits and date extension     Authorization Time Period  8 more visist approved from 7/31 - 12/30 - can request more visits if needed    Authorization - Visit Number  20    Authorization - Number of Visits  24    PT Start Time  1317    PT Stop Time  1402    PT Time Calculation (min)  45 min    Activity Tolerance  Patient tolerated treatment well   BP 123/91   Behavior During Therapy  [facility] for tasks assessed/performed       History reviewed. No pertinent past medical history.  History reviewed. No pertinent surgical history.  There were no vitals filed for this visit.  Subjective Assessment - 02/27/18 1321    Subjective  Have been adhering to the schedule at home and doing exercises.    Pertinent History  Skull flap surgery 11/02/17.  PMH significant for hypertension and hyperlipidemia who was [facility] with a aphasia and right hemibody weakness. CTA was obtained and revealed left M1 cutoff; cerebral angiography with mechanical thrombectomy. The patient's acute hospital course was complicated by worsening cytotoxic edema as well areas of hemorrhagic conversion with left to right midline shift. He was taken to the OR on 06/01/2017 for decompressive left hemicraniectomy. [name]. He was found to have a right peroneal, right posterior tibial, right gastrocnemius,left basilic, and left axillary venous thrombi. He  then developed totally occluding superficial thrombophlebitis of the left cephalic vein and right cephalic vein. Venous thrombus then progressed in the left upper arm to the left brachial vein. The patient was not anticoagulated due to recent hemorrhagic conversion of stroke. PEG was placed by Dr. Allayne ButcherMowery on 06/15/2017. There was progression of right lower extremity DVT from the posterior tibial vein to the right common femoral vein. Vascular surgery was consulted and placed an inferior vena cava filter on 06/18/2017. expressive aphasia, verbal apraxia; [facility], then [facility], [facility], speech discharged 09/21/17    Patient Stated Goals  Per wife:  to get strong enough to get upstairs, and use the bathroom on his own; trying to be more mobile outside the home-fishing, more recreational things    Currently in Pain?  No/denies                       [facility] Adult PT Treatment/Exercise - 02/28/18 0001      Transfers   Transfers  Sit to Stand;Stand to Sit    Sit to Stand  6: Modified independent (Device/Increase time);With upper extremity assist;From chair/3-in-1    Stand to Sit  5: Supervision;With upper extremity assist;To chair/3-in-1    Number of Reps  --   Multiple reps through session   Transfer Cueing  Cues for full turn around  to sit at chair      Ambulation/Gait   Ambulation/Gait  Yes    Ambulation/Gait Assistance  4: Min assist    Ambulation/Gait Assistance Details  Wearing R Spry Step AFO with heelwedge.      Ambulation Distance (Feet)  115 Feet   30 ft   Assistive device  Small based quad cane    Gait Pattern  Decreased step length - left;Decreased stance time - right;Decreased stride length;Decreased dorsiflexion - right;Right genu recurvatum;Trunk flexed;Step-through pattern;Decreased weight shift to right    Ambulation Surface  Level;Indoor    Gait Comments  Gait activities in parallel bars, 8 reps x  10 ft, with use of visual cues for upright posture and improved R foot clearance.  Gait x 115 ft around gym with improved R foot clearance and heelstrike after use of visual cues at mirror and with pre-gait neuro re-ed activities.      Neuro Re-ed    Neuro Re-ed Details   At outside of parallel bars, with mirror in front for visual cues, for patient to "find" midline to improve RLE weightshifting.  RLE weightbearing activity with L knee flexion, 3 second hold 2 sets x 5 reps, then RLE weightbearing with L lateral step taps x 3 sets x 5 reps.  In standing, PT provides tactile cues and facilitation of R quads for terminal knee extension in static standing.  RLE as stance leg for weightbearing with LLE step taps to 2" threshold of parallel bars 2 sets x 5 reps.  Provided tactile cues to block R knee hyperextension.      Exercises   Exercises  Knee/Hip;Ankle      Knee/Hip Exercises: Stretches   Active Hamstring Stretch  Right;2 reps   Attempted with R foot propped on bolster     Ankle Exercises: Stretches   Other Stretch  Passive stretch to R gastroc,3 x 15 second hold    Other Stretch  Seated heelslides with therapist assisting into R foot flat position, then press heel down into floor, 8 reps to work on ROM for R ankle dorsiflexion             PT Education - 02/28/18 2033    Education provided  Yes    Education Details  Addition to HEP of RLE stance with LLE knee flexion, RLE stance with LLE step taps to side, seated heelslides; education to maintain full foot flat on RLE with sitting activities to promote improved ROM for dorsiflexion    Person(s) Educated  Patient;Spouse    Methods  Explanation;Demonstration   ran out of time to give pictures   Comprehension  Verbalized understanding;Returned demonstration       PT Short Term Goals - 02/23/18 1515      PT SHORT TERM GOAL #1   Title  Pt will perform updated HEP with wife's assistance for improved strength, balance, and gait.   TARGET 03/23/18    Time  4   per recert 02/22/18   Period  Weeks    Status  New    Target Date  03/23/18      PT SHORT TERM GOAL #2   Title  Pt will perform sit<>stand modified independently for improved transfer efficiency and safety.    Baseline  supervision 02/22/18    Time  4    Period  Weeks    Status  New    Target Date  03/23/18      PT SHORT TERM GOAL #3   Title  Pt will ambulate at least 200 ft using SBQC and AFO with supervision for improved gait safety and independence.    Baseline  115 ft [facility] 02/22/18 min assist    Time  4    Period  Weeks    Status  New    Target Date  03/23/18      PT SHORT TERM GOAL #4   Title  Pt will negotiate one step into downstairs bathroom at home, using [facility] with minimal assistance, for improved ADL participation and independence at home.    Baseline  mod assist with [facility]; is using hemiwalker at home    Time  4    Period  Weeks    Status  New    Target Date  03/23/18        PT Long Term Goals - 02/22/18 1328      PT LONG TERM GOAL #1   Title  Pt/wife will verbalize plans for continued community fitness upon d/c from PT.  TARGET 04/21/18    Baseline  Will continue to address as pt nears d/c    Time  8   per recert 01/15/14/19   Period  Weeks    Status  Revised    Target Date  04/21/18      PT LONG TERM GOAL #2   Title  Pt will improve Berg Balance test to at least 35/56 for decreased fall risk.    Baseline  27/56 02/20/18    Time  8    Period  Weeks    Status  Revised    Target Date  04/21/18      PT LONG TERM GOAL #3   Title  Pt will ambulate at least 300 ft using least restrictive assistive device (SBQC versus rubber tip quad cane), modified independently, for improved safety and independence with gait.    Baseline   115 ft 02/22/18 with min assist with [facility]    Time  8    Period  Weeks    Status  Revised    Target Date  04/21/18      PT LONG TERM GOAL #4   Title  Pt will improve gait velocity to >/= 1.3 ft/sec with use of  appropriate AFO and LRAD for improved gait efficiency and safety.    Baseline   0.73 ft/sec 02/20/18    Time  8    Period  Weeks    Status  Revised    Target Date  04/21/18      PT LONG TERM GOAL #5   Title  Pt will negotiate 12 steps using handrail, with supervision, for improved safety and independence with stair negotiation for home.    Baseline  4 steps min assist 02/20/18    Time  8    Period  Weeks    Status  Revised    Target Date  04/21/18      PT LONG TERM GOAL #6   Title  Pt and wife will verbalize/demonstrate maneuvers to decrease residual episodes of BPPV.    Status  Revised    Target Date  04/21/18   8 weeks     PT LONG TERM GOAL #7   Title       Baseline       Time  --    Period  --    Status  --            Plan - 02/28/18 2053    Clinical Impression Statement  Pt ready and motivated for therapy session today, which focused on neuro-reeducation to RLE and pre-gait activities in parallel bars, stretching to RLE gastroc and hamstrings, and gait training with SBQC using AFO and heelwedge.  Pt responds well to tactile cues in addition to visual cues of mirror to assist with RLE weightbearing and to decrease R knee hyperextension in standing.  Followiing pre-gait and neuro-reeducation activiteis in parallel bars, he has improved R foot clearance with gait in clinic.    Rehab Potential  Good    Clinical Impairments Affecting Rehab Potential  Good family support; severity of deficits    PT Frequency  2x / week    PT Duration  8 weeks   per recert 02/22/18   PT Treatment/Interventions  ADLs/Self Care Home Management;DME Instruction;Gait training;Stair training;Functional mobility training;Therapeutic activities;Therapeutic exercise;Balance training;Orthotic Fit/Training;Patient/family education;Neuromuscular re-education;Canalith Repostioning;Vestibular;Electrical Stimulation    PT Next Visit Plan  CHECK BP AND MONITOR FOR ORTHOSTASIS.  Review standing exercises verbally  given as HEP last visit (ran out of time to give pictures, but see MedBridge code below to give pateint a copy)Gait with quad tip vs SBQC with spry step PLS AFO, Treat L BPPV if needed/teach wife home maneuver with pillow.  Pt to receive AFO In <2 weeks from [employer].  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  Continue to work on RLE weightbearing.  If PT is first, trial patient walking back into therapy session.   Check with pt/wife on how they are doing with OT's activity list for home   PT Home Exercise Plan  [email or web address] (medBridge)    Consulted and Agree with Plan of Care  Patient;Family member/caregiver    Family Member Consulted  wife       Patient will benefit from skilled therapeutic intervention in order to improve the following deficits and impairments:  Abnormal gait, Decreased activity tolerance, Decreased balance, Decreased knowledge of precautions, Decreased endurance, Decreased knowledge of use of DME, Decreased mobility, Decreased safety awareness, Difficulty walking, Decreased strength, Impaired tone, Postural dysfunction, Dizziness  Visit Diagnosis: Muscle weakness (generalized)  Other abnormalities of gait and mobility  Unsteadiness on feet     Problem List There are no active problems to display for this patient.   MARRIOTT,AMY W. 02/28/2018, 9:00 PM  Gean Maidens., PT   [facility] [address] [place], [place], [address] Phone: [phone]   Fax:  [phone]  Name: Tim Stephens MRN: [id number] Date of Birth: 1955/10/04

## 2018-03-01 ENCOUNTER — Ambulatory Visit: Payer: BLUE CROSS/BLUE SHIELD | Admitting: Physical Therapy

## 2018-03-01 ENCOUNTER — Encounter: Payer: Self-pay | Admitting: Occupational Therapy

## 2018-03-01 ENCOUNTER — Ambulatory Visit: Payer: BLUE CROSS/BLUE SHIELD | Admitting: Occupational Therapy

## 2018-03-01 ENCOUNTER — Ambulatory Visit: Payer: BLUE CROSS/BLUE SHIELD | Admitting: Speech Pathology

## 2018-03-01 ENCOUNTER — Encounter: Payer: Self-pay | Admitting: Physical Therapy

## 2018-03-01 ENCOUNTER — Encounter: Payer: Self-pay | Admitting: Speech Pathology

## 2018-03-01 VITALS — BP 120/80

## 2018-03-01 DIAGNOSIS — I69318 Other symptoms and signs involving cognitive functions following cerebral infarction: Secondary | ICD-10-CM

## 2018-03-01 DIAGNOSIS — R4701 Aphasia: Secondary | ICD-10-CM

## 2018-03-01 DIAGNOSIS — I69351 Hemiplegia and hemiparesis following cerebral infarction affecting right dominant side: Secondary | ICD-10-CM

## 2018-03-01 DIAGNOSIS — R2681 Unsteadiness on feet: Secondary | ICD-10-CM

## 2018-03-01 DIAGNOSIS — R29818 Other symptoms and signs involving the nervous system: Secondary | ICD-10-CM

## 2018-03-01 DIAGNOSIS — R482 Apraxia: Secondary | ICD-10-CM

## 2018-03-01 DIAGNOSIS — R2689 Other abnormalities of gait and mobility: Secondary | ICD-10-CM

## 2018-03-01 DIAGNOSIS — R29898 Other symptoms and signs involving the musculoskeletal system: Secondary | ICD-10-CM

## 2018-03-01 DIAGNOSIS — M6281 Muscle weakness (generalized): Secondary | ICD-10-CM

## 2018-03-01 DIAGNOSIS — R293 Abnormal posture: Secondary | ICD-10-CM

## 2018-03-01 DIAGNOSIS — R42 Dizziness and giddiness: Secondary | ICD-10-CM | POA: Diagnosis not present

## 2018-03-01 NOTE — Therapy (Signed)
[place] [address] [place], [place], [address] Phone: [phone]   Fax:  [phone]  Speech Language Pathology Treatment  Patient Details  Name: Tim Stephens MRN: [id number] Date of Birth: [birth year] Referring Provider: Dr. Scarlette Ar   Encounter Date: 03/01/2018  End of Session - 03/01/18 1458    Visit Number  20    Number of Visits  26    Date for SLP Re-Evaluation  03/10/18    Authorization Type  30 total ST visits, no auth.  Spouse/pt to leave 5 visits prior to end of year. Per wife on 02-22-18, HHST did NOT count towards OPST visits.    Authorization Time Period  Pt on hold until he receives SGD Touch Talk 03/01/18    Authorization - Visit Number  64    Authorization - Number of Visits  25    SLP Start Time  1448    SLP Stop Time   1446    SLP Time Calculation (min)  42 min    Activity Tolerance  Patient tolerated treatment well       History reviewed. No pertinent past medical history.  History reviewed. No pertinent surgical history.  There were no vitals filed for this visit.  Subjective Assessment - 03/01/18 1450    Subjective  "He went to [employer] and wanted to go in, so we did. Then he wanted to go to [employer] "    Patient is accompained by:  Family member    Currently in Pain?  No/denies            ADULT SLP TREATMENT - 03/01/18 1451      General Information   Behavior/Cognition  Alert;Cooperative;Pleasant mood      Treatment Provided   Treatment provided  Cognitive-Linquistic      Cognitive-Linquistic Treatment   Treatment focused on  Aphasia;Apraxia    Skilled Treatment  Heard back from [place] [facility] - they are waiting on agreement from insurance  which will take 1-2 weeks. I had pt cancel ST appointments until the device arrives. According to wife, pt is to start ST at [facility] - she may not return to our clinic until the end of the year. Pt utilized our clinic's Touch Talk to communicate  what he takes on his burger. Pt approximates repetition of word after SGD says word. Pt navigates SGD with min A, however clinic's device is not personalized to him. Verbal expression of personall relevant phrases  with and without carrier phrases with consistent mod to max A.       Assessment / Recommendations / Plan   Plan  Other (Comment)   on hold for ST until SGD arrives     Progression Toward Goals   Progression toward goals  Not progressing toward goals (comment)   due no SGD has not arrived - on hold until pt receives SGD        SLP Short Term Goals - 03/01/18 1458      SLP SHORT TERM GOAL #1   Title  Pt will ID object f:5 to sentence description with 90% accuracy and occasional min A    Status  Not Met      SLP SHORT TERM GOAL #2   Title  Pt will complete automatic speech tasks with 70% accuracy and visual cues over 3 sessions    Status  Not Met      SLP SHORT TERM GOAL #3   Title  Pt will utilize  multimodal communication and AAC to communicate wants and answer questions with 70% accuracy and occasional min A over 3 sessions    Status  Not Met      SLP SHORT TERM GOAL #4   Title  Pt will write/type at word level with 80% accuracy and occasional min A over 2 sessions    Status  Not Met       SLP Long Term Goals - 03/01/18 1458      SLP LONG TERM GOAL #1   Title  Pt will comprehend mildly complex conversation over 8 minute conversation with appropriate responses (mulitmodal) and 2 or less requests for repetition    Status  Achieved      SLP LONG TERM GOAL #2   Title  Pt will utilize multimodal communication/ACC to produce 2 word response to conversation/question 8/10x with occasional min A    Status  Achieved      SLP LONG TERM GOAL #3   Title  Pt will write 1 word  with 70% accuracy and occasional min A    Baseline  02/13/18    Time  2    Period  Weeks    Status  Not Met      SLP LONG TERM GOAL #4   Title  Pt will produce 1-2 syllable words in phrase  completion task with 70% accuracy and occassional mod A    Baseline  01/04/18    Time  3    Period  Weeks    Status  On-going      SLP LONG TERM GOAL #5   Title  pt will use SGD to indicate needs, questions, and desires with rare min A    Time  3    Period  Weeks    Status  New      SLP LONG TERM GOAL #6   Title  pt will demo basic understanding of how to make new icons with SGD, with rare min A    Time  4    Period  Weeks    Status  New       Plan - 03/01/18 1455    Clinical Impression Statement  Severe non fluent aphasia persists. Contacted [employer] - they are awaiting agreement for payment/benefits from insurance companty which will take 1-2 weeks. Due to this, we will place pt on hold until his Touch Talk arrives. Per spouse, if pt starts ST at [facility], she may wait to re-start ST after the semester ends. Pt requires continued ST upon receiving SGD to maximize multimodal communication with AAC device.    Speech Therapy Frequency  2x / week    Duration  4 weeks   8 more sessions beginning week of 02/26/18   Treatment/Interventions  Language facilitation;Environmental controls;Cueing hierarchy;Oral motor exercises;SLP instruction and feedback;Compensatory strategies;Functional tasks;Cognitive reorganization;Internal/external aids;Multimodal communcation approach;Patient/family education;Other (comment)    Potential to Achieve Goals  Good    Potential Considerations  Severity of impairments       Patient will benefit from skilled therapeutic intervention in order to improve the following deficits and impairments:   Aphasia  Verbal apraxia    Problem List There are no active problems to display for this patient.   Billie Intriago, Annye Rusk MS, CCC-SLP 03/01/2018, 2:59 PM  [place] [address] [place], [place], [address] Phone: [phone]   Fax:  [phone]   Name: Tim Stephens MRN: [id number] Date of Birth:  1956/06/13

## 2018-03-01 NOTE — Therapy (Signed)
[facility] [address] [place], [place], [address] Phone: [phone]   Fax:  [phone]  Occupational Therapy Treatment  Patient Details  Name: Tim Stephens MRN: [id number] Date of Birth: [birth year] Referring Provider: Jenita Seashore, MD   Encounter Date: 03/01/2018  OT End of Session - 03/01/18 1653    Visit Number  17    Number of Visits  24    Date for OT Re-Evaluation  04/09/18    Authorization Type  BCBS    Authorization Time Period  pt has been approved for 8 additonal visits for OT by 04/09/2018 (24 total)    Authorization - Visit Number  17    Authorization - Number of Visits  24    OT Start Time  1446    OT Stop Time  1530    OT Time Calculation (min)  44 min    Activity Tolerance  Patient tolerated treatment well       History reviewed. No pertinent past medical history.  History reviewed. No pertinent surgical history.  There were no vitals filed for this visit.  Subjective Assessment - 03/01/18 1450    Subjective   Yes that was ok    Patient is accompained by:  Family member   wife   Pertinent History  MONITOR BP!!!  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  No/denies                   OT Treatments/Exercises (OP) - 03/01/18 0001      ADLs   Toileting  Addressed walking to bathroom using SBQC with min guard. Pt able to manage pants with cues and contact guard only. Min a to sit down on regular toilet seat.  Pt able to scoot forward with cues and min a on seat and then complete hygiene after bowel movement. Encouraged wife to allow pt to complete own hygiene and then follow behind if necessary.  Pt then able to do sit to stand from low seat with min a and walk back to clinic using quad tip cane  and contact guard.  Pt rated fatigue 3/10 and had no BP issues. Strongly recommended that pt stop using urinal during the day and use the toilet at home.Pt and wife in agreement.     ADL Comments  DIscussed use of GivMohr Sling when ambulating and showed pt and wife pictures.  They are open to considering it.  Will attempt to borrow loaner from inpt rehab so pt can try before ordering one.                OT Short Term Goals - 03/01/18 1648      OT SHORT TERM GOAL #1   Title  Pt and wife will be mod I with HEP for RUE ROM - 01/30/2018 (renewed)    Status  Achieved      OT SHORT TERM GOAL #2   Title  Pt will be min a for grooming with mod cues from wife and intermittent set up prn    Status  Achieved      OT SHORT TERM GOAL #3   Title  Pt will be mod a for UB bathing at wheelchair level with mod cues    Status  Achieved      OT SHORT TERM GOAL #4   Title  Pt will be mod a for LB bathing at sit to stand level  with mod cues and set up    Status  Achieved      OT SHORT TERM GOAL #5   Title  Pt will be max a for UB dressing at wheelchair level, mod cues    Status  Achieved      OT SHORT TERM GOAL #6   Title  Pt and wife will explore options for 3 in 1 commode for pt to use in bathroom or at bedside    Status  Achieved      OT SHORT TERM GOAL #7   Title  Pt will use RUE as stabilizer 25% of the time during basic self care skills with min a and cues.     Status  Achieved      OT SHORT TERM GOAL #8   Title  Pt will be supervion for toilet transfers - 03/29/2018    Status  New      OT SHORT TERM GOAL  #9   TITLE  Pt will tolerate functional activity at ambulatory level for 5 minutes with no rest breaks    Status  New        OT Long Term Goals - 03/01/18 1648      OT LONG TERM GOAL #1   Title  Pt and wife will be mod I with home activity program designed to increase independence in basic self care as well as address cognition -04/26/2018 (renewed)    Status  On-going       OT LONG TERM GOAL #2   Title  Pt will be min a for UB bathing after set up with mod cues    Status  Achieved      OT LONG TERM GOAL #3   Title  Pt will be min a for UB dressing with mod cues    Status  Achieved      OT LONG TERM GOAL #4   Title  Pt will be min a for LB bathing with mod cues    Status  New      OT LONG TERM GOAL #5   Title  Pt will be min a for LB dressing with mod cues    Status  New      OT LONG TERM GOAL #6   Title  Pt will be min a for toilet transfers in bathroom (able to ambulate 3 steps into bathroom with wife).      Status  Achieved      OT LONG TERM GOAL #7   Title  Pt will be mod a for clothing mmgt with toileting    Status  Achieved      OT LONG TERM GOAL #8   Title  Pt will demonstrate ability to use RUE as stabilizer during basic self care 50% of the time with min a and cues.     Status  On-going            Plan - 03/01/18 1652    Clinical Impression Statement  Pt continues to progress toward goals and is engaging more at home in basic self care tasks.     Occupational Profile and client history currently impacting functional performance  HLD, HTN, s/p craniotomy, IVC filter with Coumadin, seizures.    Occupational performance deficits (Please refer to evaluation for details):  ADL's;IADL's;Rest and Sleep;Work;Leisure;Social  Participation    Rehab Potential  Good    Current Impairments/barriers affecting progress:  severity of deficits    OT Frequency  2x / week    OT Duration  8 weeks    Plan  NMR trunk/RUE, sit to stand, stand to sit, balance, functional mobility, possible use of Give Mohr sling for ambulation    Consulted and Agree with Plan of Care  Patient    Family Member Consulted  [name]       Patient will benefit from skilled therapeutic intervention in order to improve the following deficits and impairments:  Abnormal gait, Decreased activity tolerance, Decreased balance, Decreased cognition, Decreased knowledge of use  of DME, Decreased mobility, Decreased range of motion, Decreased safety awareness, Difficulty walking, Decreased strength, Impaired UE functional use, Impaired tone, Impaired sensation, Pain  Visit Diagnosis: Muscle weakness (generalized) - Plan: Ot plan of care cert/re-cert  Hemiplegia and hemiparesis following cerebral infarction affecting right dominant side (HCC) - Plan: Ot plan of care cert/re-cert  Other symptoms and signs involving the nervous system - Plan: Ot plan of care cert/re-cert  Other symptoms and signs involving cognitive functions following cerebral infarction - Plan: Ot plan of care cert/re-cert  Other symptoms and signs involving the musculoskeletal system - Plan: Ot plan of care cert/re-cert  Unsteadiness on feet - Plan: Ot plan of care cert/re-cert  Abnormal posture - Plan: Ot plan of care cert/re-cert  Apraxia - Plan: Ot plan of care cert/re-cert    Problem List There are no active problems to display for this patient.   Mackie Paiulaski, Verla Bryngelson [facility] Center[facility]iday,OTR/L 03/01/2018, 4:55 PM  [facility] [address] [place], [place], [address] Phone: [phone]   Fax:  [phone]  Name: Pennelope BrackenJames M Wardlow MRN: [id number] Date of Birth: 05/24/1956

## 2018-03-01 NOTE — Patient Instructions (Signed)
Access Code: [email or web address]  URL: [email or web address]  Date: 03/01/2018  Prepared by: Bufford LopeAudra Surya Folden   Exercises  Single Leg Stance with Support - 3 sets - 10 second hold - 1x daily - 5x weekly  Standing Hip Abduction - 10 reps - 2 sets - 1x daily - 5x weekly  Stool Scoots - 10 reps - 3 sets - 1x daily - 5x weekly  Sit to Stand with Counter Support - 5 reps - 2 sets - 1x daily - 5x weekly

## 2018-03-01 NOTE — Therapy (Signed)
[facility] [address] [place], [place], [address] Phone: [phone]   Fax:  [phone]  Physical Therapy Treatment  Patient Details  Name: Tim Stephens MRN: [id number] Date of Birth: 09-22-1955 Referring Provider: Jenita Seashore, MD   Encounter Date: 03/01/2018  PT End of Session - 03/01/18 2125    Visit Number  21    Number of Visits  24   8 more visits approved on 8/6   Date for PT Re-Evaluation  04/23/18   recert 2x/wk for 8 weeks on 02/22/18   Authorization Type  BCBS- must request more visits and date extension     Authorization Time Period  8 more visist approved from 7/31 - 12/30 - can request more visits if needed    Authorization - Visit Number  21    Authorization - Number of Visits  24    PT Start Time  1320    PT Stop Time  1400    PT Time Calculation (min)  40 min    Activity Tolerance  Patient tolerated treatment well   BP 123/91   Behavior During Therapy  [facility] for tasks assessed/performed       History reviewed. No pertinent past medical history.  History reviewed. No pertinent surgical history.  Vitals:   03/01/18 1337  BP: 120/80    Subjective Assessment - 03/01/18 1324    Subjective  No longer having dizziness.  Asking about his AFO.  Will contact orthotist about AFO.   Has been sitting up more and has even gone out to eat in the w/c.  The other day he didn't have his stockings on and after being out all morning pt had one episode of syncope and emesis after using the restroom. Has not had another episode since then.    Pertinent History  Skull flap surgery 11/02/17.  PMH significant for hypertension and hyperlipidemia who was admitted to [facility] on 05/30/2017 with a aphasia and right hemibody weakness. CTA was obtained and revealed left M1 cutoff; cerebral angiography with mechanical thrombectomy. The patient's acute hospital course was complicated by worsening cytotoxic edema as well areas of  hemorrhagic conversion with left to right midline shift. He was taken to the OR on 06/01/2017 for decompressive left hemicraniectomy. [name] He was found to have a right peroneal, right posterior tibial, right gastrocnemius,left basilic, and left axillary venous thrombi. He then developed totally occluding superficial thrombophlebitis of the left cephalic vein and right cephalic vein. Venous thrombus then progressed in the left upper arm to the left brachial vein. The patient was not anticoagulated due to recent hemorrhagic conversion of stroke. PEG was placed by Dr. Allayne Butcher on 06/15/2017. There was progression of right lower extremity DVT from the posterior tibial vein to the right common femoral vein. Vascular surgery was consulted and placed an inferior vena cava filter on 06/18/2017. expressive aphasia, verbal apraxia; therapy at [facility] January 2019, then [facility] rehab for 4 weeks, then [facility] therapy PT, OT, speech discharged 09/21/17    Patient Stated Goals  Per wife:  to get strong enough to get upstairs, and use the bathroom on his own; trying to be more mobile outside the home-fishing, more recreational things    Currently in Pain?  No/denies       Access Code: [email or web address]  URL: [email or web address]  Date: 03/01/2018  Prepared by: Bufford Lope   Exercises reviewed and updated today during PT session:   Single Leg Stance with Support - 3  sets - 10 second hold - 1x daily - 5x weekly  Standing Hip Abduction - 10 reps - 2 sets - 1x daily - 5x weekly  Stool Scoots - 10 reps - 3 sets - 1x daily - 5x weekly  Sit to Stand with Counter Support - 5 reps - 2 sets - 1x daily - 5x weekly        PT Education - 03/01/18 2124    Education provided  Yes    Education Details  reviewed and updated HEP    Person(s) Educated  Patient;Spouse    Methods  Explanation;Demonstration;Handout    Comprehension  Verbalized understanding;Returned demonstration       PT Short Term Goals  - 02/23/18 1515      PT SHORT TERM GOAL #1   Title  Pt will perform updated HEP with wife's assistance for improved strength, balance, and gait.  TARGET 03/23/18    Time  4   per recert 02/22/18   Period  Weeks    Status  New    Target Date  03/23/18      PT SHORT TERM GOAL #2   Title  Pt will perform sit<>stand modified independently for improved transfer efficiency and safety.    Baseline  supervision 02/22/18    Time  4    Period  Weeks    Status  New    Target Date  03/23/18      PT SHORT TERM GOAL #3   Title  Pt will ambulate at least 200 ft using SBQC and AFO with supervision for improved gait safety and independence.    Baseline  115 ft [facility] 02/22/18 min assist    Time  4    Period  Weeks    Status  New    Target Date  03/23/18      PT SHORT TERM GOAL #4   Title  Pt will negotiate one step into downstairs bathroom at home, using [facility] with minimal assistance, for improved ADL participation and independence at home.    Baseline  mod assist with [facility]; is using hemiwalker at home    Time  4    Period  Weeks    Status  New    Target Date  03/23/18        PT Long Term Goals - 02/22/18 1328      PT LONG TERM GOAL #1   Title  Pt/wife will verbalize plans for continued community fitness upon d/c from PT.  TARGET 04/21/18    Baseline  Will continue to address as pt nears d/c    Time  8   per recert 01/15/14/19   Period  Weeks    Status  Revised    Target Date  04/21/18      PT LONG TERM GOAL #2   Title  Pt will improve Berg Balance test to at least 35/56 for decreased fall risk.    Baseline  27/56 02/20/18    Time  8    Period  Weeks    Status  Revised    Target Date  04/21/18      PT LONG TERM GOAL #3   Title  Pt will ambulate at least 300 ft using least restrictive assistive device (SBQC versus rubber tip quad cane), modified independently, for improved safety and independence with gait.    Baseline   115 ft 02/22/18 with min assist with [facility]    Time  8    Period  Weeks    Status  Revised    Target Date  04/21/18      PT LONG TERM GOAL #4   Title  Pt will improve gait velocity to >/= 1.3 ft/sec with use of appropriate AFO and LRAD for improved gait efficiency and safety.    Baseline   0.73 ft/sec 02/20/18    Time  8    Period  Weeks    Status  Revised    Target Date  04/21/18      PT LONG TERM GOAL #5   Title  Pt will negotiate 12 steps using handrail, with supervision, for improved safety and independence with stair negotiation for home.    Baseline  4 steps min assist 02/20/18    Time  8    Period  Weeks    Status  Revised    Target Date  04/21/18      PT LONG TERM GOAL #6   Title  Pt and wife will verbalize/demonstrate maneuvers to decrease residual episodes of BPPV.    Status  Revised    Target Date  04/21/18   8 weeks     PT LONG TERM GOAL #7   Title       Baseline       Time  --    Period  --    Status  --            Plan - 03/01/18 2129    Clinical Impression Statement  Treatment session today focused on review of HEP from yesterday.  Upgraded heel slides to w/c scoots with RLE only.  Also added sit > stand with feet staggered at counter to increase use and WB through RLE.  Pt tolerated well with no orthostasis.  Pt will continue to require reclining w/c due to ongoing intermittent episodes of orthostasis after being up for prolonged period of time, especially if not wearing TED hose.  Will contact orthotist regarding AFO delivery.  Will contiue to progress as pt is able to tolerate.      Rehab Potential  Good    Clinical Impairments Affecting Rehab Potential  Good family support; severity of deficits    PT Frequency  2x / week    PT Duration  8 weeks   per recert 02/22/18   PT Treatment/Interventions  ADLs/Self Care Home Management;DME Instruction;Gait training;Stair training;Functional mobility training;Therapeutic activities;Therapeutic exercise;Balance training;Orthotic Fit/Training;Patient/family education;Neuromuscular  re-education;Canalith Repostioning;Vestibular;Electrical Stimulation    PT Next Visit Plan  CHECK BP AND MONITOR FOR ORTHOSTASIS.  Continue to add to HEP below.  Gait with quad tip vs SBQC with spry step PLS AFO, Work on one step negotiation training to access downstairs bathroom, stair negotiation.  Continue to work on RLE weightbearing.  If PT is first, trial patient walking back into therapy session.   Check with pt/wife on how they are doing with OT's activity list for home   PT Home Exercise Plan  [email or web address] (medBridge)    Consulted and Agree with Plan of Care  Patient;Family member/caregiver    Family Member Consulted  wife       Patient will benefit from skilled therapeutic intervention in order to improve the following deficits and impairments:  Abnormal gait, Decreased activity tolerance, Decreased balance, Decreased knowledge of precautions, Decreased endurance, Decreased knowledge of use of DME, Decreased mobility, Decreased safety awareness, Difficulty walking, Decreased strength, Impaired tone, Postural dysfunction, Dizziness  Visit Diagnosis: Muscle weakness (generalized)  Hemiplegia and hemiparesis following cerebral infarction affecting right dominant  side (HCC)  Other symptoms and signs involving the nervous system  Unsteadiness on feet  Other abnormalities of gait and mobility     Problem List There are no active problems to display for this patient.  Dierdre Highman, PT, DPT 03/01/18    9:35 PM    [facility] [address] [place], [place], [address] Phone: [phone]   Fax:  [phone]  Name: Tim Stephens MRN: [id number] Date of Birth: 27-Feb-1956

## 2018-03-14 ENCOUNTER — Ambulatory Visit: Payer: BLUE CROSS/BLUE SHIELD

## 2018-03-15 ENCOUNTER — Encounter: Payer: Self-pay | Admitting: Physical Therapy

## 2018-03-15 ENCOUNTER — Ambulatory Visit: Payer: BLUE CROSS/BLUE SHIELD | Attending: Family Medicine | Admitting: Physical Therapy

## 2018-03-15 ENCOUNTER — Ambulatory Visit: Payer: BLUE CROSS/BLUE SHIELD | Admitting: Occupational Therapy

## 2018-03-15 ENCOUNTER — Encounter: Payer: Self-pay | Admitting: Occupational Therapy

## 2018-03-15 VITALS — BP 124/94 | HR 90

## 2018-03-15 VITALS — BP 113/92 | HR 93

## 2018-03-15 DIAGNOSIS — R2689 Other abnormalities of gait and mobility: Secondary | ICD-10-CM | POA: Diagnosis present

## 2018-03-15 DIAGNOSIS — R293 Abnormal posture: Secondary | ICD-10-CM

## 2018-03-15 DIAGNOSIS — R29818 Other symptoms and signs involving the nervous system: Secondary | ICD-10-CM | POA: Insufficient documentation

## 2018-03-15 DIAGNOSIS — I69351 Hemiplegia and hemiparesis following cerebral infarction affecting right dominant side: Secondary | ICD-10-CM

## 2018-03-15 DIAGNOSIS — R29898 Other symptoms and signs involving the musculoskeletal system: Secondary | ICD-10-CM | POA: Insufficient documentation

## 2018-03-15 DIAGNOSIS — M6281 Muscle weakness (generalized): Secondary | ICD-10-CM

## 2018-03-15 DIAGNOSIS — H8112 Benign paroxysmal vertigo, left ear: Secondary | ICD-10-CM | POA: Insufficient documentation

## 2018-03-15 DIAGNOSIS — I69318 Other symptoms and signs involving cognitive functions following cerebral infarction: Secondary | ICD-10-CM | POA: Diagnosis present

## 2018-03-15 DIAGNOSIS — R42 Dizziness and giddiness: Secondary | ICD-10-CM | POA: Insufficient documentation

## 2018-03-15 DIAGNOSIS — R482 Apraxia: Secondary | ICD-10-CM

## 2018-03-15 DIAGNOSIS — R4701 Aphasia: Secondary | ICD-10-CM | POA: Insufficient documentation

## 2018-03-15 DIAGNOSIS — R2681 Unsteadiness on feet: Secondary | ICD-10-CM | POA: Diagnosis present

## 2018-03-15 NOTE — Therapy (Signed)
[facility] [address] [place], [place], [address] Phone: [phone]   Fax:  [phone]  Physical Therapy Treatment  Patient Details  Name: CATHERINE OAK MRN: [id number] Date of Birth: Aug 23, 1955 Referring Provider: Jenita Seashore, MD   Encounter Date: 03/15/2018  PT End of Session - 03/15/18 1045    Visit Number  22    Number of Visits  32   8 more visits approved 8/6; 8 additional visits approved 03/01/18   Date for PT Re-Evaluation  04/23/18   recert 2x/wk for 8 weeks on 02/22/18   Authorization Type  [employer]- must request more visits and date extension     Authorization Time Period  8 more visist approved from 7/31 - 12/30, plus additional 8 visits approved 03/01/18-/05/01/18    Authorization - Visit Number  22    Authorization - Number of Visits  32    PT Start Time  0849    PT Stop Time  0928    PT Time Calculation (min)  39 min    Equipment Utilized During Treatment  Gait belt    Activity Tolerance  Patient tolerated treatment well   BP 124/98, 124/94 end of session   Behavior During Therapy  [facility] for tasks assessed/performed       History reviewed. No pertinent past medical history.  History reviewed. No pertinent surgical history.  Vitals:   03/15/18 0913 03/15/18 0925  BP: (!) 124/98 (!) 124/94  Pulse: 90     Subjective Assessment - 03/15/18 0850    Subjective  Went to the bathroom on his own.  Up and around more at the house, went out to dinner for wife's birthday.    Pertinent History  Skull flap surgery 11/02/17.  PMH significant for hypertension and hyperlipidemia who was admitted to [facility] on 05/30/2017 with a aphasia and right hemibody weakness. CTA was obtained and revealed left M1 cutoff; cerebral angiography with mechanical thrombectomy. The patient's acute hospital course was complicated by worsening cytotoxic edema as well areas of hemorrhagic conversion with left to right midline shift. He was  taken to the OR on 06/01/2017 for decompressive left hemicraniectomy. [name] He was found to have a right peroneal, right posterior tibial, right gastrocnemius,left basilic, and left axillary venous thrombi. He then developed totally occluding superficial thrombophlebitis of the left cephalic vein and right cephalic vein. Venous thrombus then progressed in the left upper arm to the left brachial vein. The patient was not anticoagulated due to recent hemorrhagic conversion of stroke. PEG was placed by Dr. Allayne Butcher on 06/15/2017. There was progression of right lower extremity DVT from the posterior tibial vein to the right common femoral vein. Vascular surgery was consulted and placed an inferior vena cava filter on 06/18/2017. expressive aphasia, verbal apraxia; therapy at [facility] January 2019, then [facility] rehab for 4 weeks, then [facility] therapy PT, OT, speech discharged 09/21/17    Patient Stated Goals  Per wife:  to get strong enough to get upstairs, and use the bathroom on his own; trying to be more mobile outside the home-fishing, more recreational things    Currently in Pain?  No/denies                       [facility] Adult PT Treatment/Exercise - 03/15/18 0001      Ambulation/Gait   Ambulation/Gait  Yes    Ambulation/Gait Assistance  4: Min assist    Ambulation/Gait Assistance Details  Wearing R  Spry step AFO with heelwedge    Ambulation Distance (Feet)  125 Feet    Assistive device  Straight cane   wiht rubber quad tip   Gait Pattern  Decreased step length - left;Decreased stance time - right;Decreased stride length;Decreased dorsiflexion - right;Right genu recurvatum;Trunk flexed;Step-through pattern;Decreased weight shift to right    Ambulation Surface  Level;Indoor    Gait Comments  PT provides cues for posture and for increased stance time on RLE to allow for improved LLE step length.        Self-Care   Self-Care  Other Self-Care Comments    Other Self-Care Comments    Assessed pt's blood pressures immediately after walking and then after seated activities.  Made patient/wife aware that AFO is in and orthotist is planning to come to upcoming PT appointment.  Discussed plan to walk back into therapy session next visit.      Neuro Re-ed    Neuro Re-ed Details   At outside of parallel bars, terminal knee extension in static standing x 10 reps, with tactile cues at quads.  Standing on RLE with LLE propped on 2" block with 1 UE support, tactile cues for full knee extension, 3 x 30 seconds, for quad activation and strengthening.  Minisquats x 10 reps with tactile cues for terminal R knee extension in upright standing.  Needs several brief seated rest breaks due to fatigue.  Pt demo seated scooting, using lower extremities.  Cues provided and patient demonstrates slowed pace with defined RLE kick and heel digs with uprihgt posture, 15 ft x 3 reps, for improved R hamstring activation.      Sit<>stand x 5 reps, with cues for equal weightbearing in upright standing.         PT Short Term Goals - 02/23/18 1515      PT SHORT TERM GOAL #1   Title  Pt will perform updated HEP with wife's assistance for improved strength, balance, and gait.  TARGET 03/23/18    Time  4   per recert 02/22/18   Period  Weeks    Status  New    Target Date  03/23/18      PT SHORT TERM GOAL #2   Title  Pt will perform sit<>stand modified independently for improved transfer efficiency and safety.    Baseline  supervision 02/22/18    Time  4    Period  Weeks    Status  New    Target Date  03/23/18      PT SHORT TERM GOAL #3   Title  Pt will ambulate at least 200 ft using SBQC and AFO with supervision for improved gait safety and independence.    Baseline  115 ft [facility] 02/22/18 min assist    Time  4    Period  Weeks    Status  New    Target Date  03/23/18      PT SHORT TERM GOAL #4   Title  Pt will negotiate one step into downstairs bathroom at home, using [facility] with minimal  assistance, for improved ADL participation and independence at home.    Baseline  mod assist with [facility]; is using hemiwalker at home    Time  4    Period  Weeks    Status  New    Target Date  03/23/18        PT Long Term Goals - 02/22/18 1328      PT LONG TERM GOAL #1   Title  Pt/wife will verbalize plans for continued community fitness upon d/c from PT.  TARGET 04/21/18    Baseline  Will continue to address as pt nears d/c    Time  8   per recert 01/15/14/19   Period  Weeks    Status  Revised    Target Date  04/21/18      PT LONG TERM GOAL #2   Title  Pt will improve Berg Balance test to at least 35/56 for decreased fall risk.    Baseline  27/56 02/20/18    Time  8    Period  Weeks    Status  Revised    Target Date  04/21/18      PT LONG TERM GOAL #3   Title  Pt will ambulate at least 300 ft using least restrictive assistive device (SBQC versus rubber tip quad cane), modified independently, for improved safety and independence with gait.    Baseline   115 ft 02/22/18 with min assist with [facility]    Time  8    Period  Weeks    Status  Revised    Target Date  04/21/18      PT LONG TERM GOAL #4   Title  Pt will improve gait velocity to >/= 1.3 ft/sec with use of appropriate AFO and LRAD for improved gait efficiency and safety.    Baseline   0.73 ft/sec 02/20/18    Time  8    Period  Weeks    Status  Revised    Target Date  04/21/18      PT LONG TERM GOAL #5   Title  Pt will negotiate 12 steps using handrail, with supervision, for improved safety and independence with stair negotiation for home.    Baseline  4 steps min assist 02/20/18    Time  8    Period  Weeks    Status  Revised    Target Date  04/21/18      PT LONG TERM GOAL #6   Title  Pt and wife will verbalize/demonstrate maneuvers to decrease residual episodes of BPPV.    Status  Revised    Target Date  04/21/18   8 weeks     PT LONG TERM GOAL #7   Title       Baseline       Time  --    Period  --    Status   --            Plan - 03/15/18 1047    Clinical Impression Statement  Treatment session focused on gait training, standing NMR to RLE, and review of seated w/c scoots for improved RLE strengthening.  Pt requires brief seated rest breaks during session due to fatigue, but tolerates session well (slightly elevated BP, but asymptomatic).  Pt will continue to benefit from skilled PT to address balance, stregnth, and gait towards LTGs.    Rehab Potential  Good    Clinical Impairments Affecting Rehab Potential  Good family support; severity of deficits    PT Frequency  2x / week    PT Duration  8 weeks   per recert 02/22/18   PT Treatment/Interventions  ADLs/Self Care Home Management;DME Instruction;Gait training;Stair training;Functional mobility training;Therapeutic activities;Therapeutic exercise;Balance training;Orthotic Fit/Training;Patient/family education;Neuromuscular re-education;Canalith Repostioning;Vestibular;Electrical Stimulation    PT Next Visit Plan  CHECK BP AND MONITOR FOR ORTHOSTASIS.  Continue to add to HEP below.  Gait with quad tip with spry step PLS AFO, Work on one step  negotiation training to access downstairs bathroom, stair negotiation.  Continue to work on RLE weightbearing.  If PT is first, trial patient walking back into therapy session.   Check with pt/wife on how they are doing with OT's activity list for home   PT Home Exercise Plan  [id number] (medBridge)    Consulted and Agree with Plan of Care  Patient;Family member/caregiver    Family Member Consulted  wife       Patient will benefit from skilled therapeutic intervention in order to improve the following deficits and impairments:  Abnormal gait, Decreased activity tolerance, Decreased balance, Decreased knowledge of precautions, Decreased endurance, Decreased knowledge of use of DME, Decreased mobility, Decreased safety awareness, Difficulty walking, Decreased strength, Impaired tone, Postural dysfunction,  Dizziness  Visit Diagnosis: Muscle weakness (generalized)  Unsteadiness on feet  Other abnormalities of gait and mobility     Problem List There are no active problems to display for this patient.   Lizzy Hamre W. 03/15/2018, 10:51 AM Gean Maidens., PT  [facility] [address] [place], [place], [address] Phone: [phone]   Fax:  [phone]  Name: CELESTINO ACKERMAN MRN: [id number] Date of Birth: 24-Apr-1956

## 2018-03-15 NOTE — Therapy (Signed)
[facility] [address] [place], [place], [address] Phone: [phone]   Fax:  [phone]  Occupational Therapy Treatment  Patient Details  Name: Tim Stephens MRN: [id number] Date of Birth: 06-17-1956 Referring Provider: Jenita Seashore, MD   Encounter Date: 03/15/2018  OT End of Session - 03/15/18 1040    Visit Number  18    Number of Visits  24    Date for OT Re-Evaluation  04/26/18    Authorization Type  BCBS    Authorization Time Period  pt has been approved for 8 additonal visits for OT by 04/09/2018 (24 total)    Authorization - Visit Number  18    Authorization - Number of Visits  24    OT Start Time  0805   wife checked in pt   OT Stop Time  0846    OT Time Calculation (min)  41 min    Activity Tolerance  Patient tolerated treatment well       History reviewed. No pertinent past medical history.  History reviewed. No pertinent surgical history.  Vitals:   03/15/18 0810  BP: (!) 113/92  Pulse: 93    Subjective Assessment - 03/15/18 0810    Subjective   He took a shower    Patient is accompained by:  Family member   wife   Pertinent History  MONITOR BP!!!  05/30/2017 admitted to [facility] with LMI cutoff, s/p thromectomy with resultant L CVA due mulitple thromboses;  IVC filter with Coumadin, 06/01/2017 craniotomy    Patient Stated Goals  unable to state due to aphasia    Currently in Pain?  No/denies                   OT Treatments/Exercises (OP) - 03/15/18 0001      ADLs   Functional Mobility  Addressed functional ambulation with quad tip cane and toilet transfers.  Pt able to ambulate 180" with only temporary increase in BP ( 133/100 after ambulation and transfer however quickly dropped to 116/93). Pt is still using urinal at home during the day - again encouraged pt to walk to bathroom with wife and use bathroom when necessary. Pt has had no accidents during the day so also recommended  that do trial of regular underwear and toilet every 2 hours.  Asked pt and wife to keep the urinal in bedroom and only use at night.  Pt and wife both verbalized understanding. Again pt is capable of supervision level toilet transfers but is rarely completinng at home.  Discussed importance of consistency in functional mobility and independence.      ADL Comments  Wife reports that pt was able to go upstairs (13 steps) with assistance and take a shower using transfer tub bench.  Wife reports pt "washed the top part of his body."  Again discussed that pt is capable of bathing everything with close stand by supervision - encouraged pt and wife to have pt be as independent as possible with basic self care, including dressing.  Pt is capable but wife continues to assist.        Neurological Re-education Exercises   Other Exercises 1  Neuro re ed to address RUE isolated movement using UE ranger in sitting. Pt needs max cues to relax L side of body and to breathe (pt tends to hold his breath). Pt with difficulty with grading force but does have isolated elbow flexion/extension as well as part range for horizontal ab/adduction.  Pt with significant sensory impairment and needs cues to stand tall and engage R shoulder during standing and functional ambulation.                OT Short Term Goals - 03/15/18 1038      OT SHORT TERM GOAL #1   Title  Pt and wife will be mod I with HEP for RUE ROM - 01/30/2018 (renewed)    Status  Achieved      OT SHORT TERM GOAL #2   Title  Pt will be min a for grooming with mod cues from wife and intermittent set up prn    Status  Achieved      OT SHORT TERM GOAL #3   Title  Pt will be mod a for UB bathing at wheelchair level with mod cues    Status  Achieved      OT SHORT TERM GOAL #4   Title  Pt will be mod a for LB bathing at sit to stand level  with mod cues and set up    Status  Achieved      OT SHORT TERM GOAL #5   Title  Pt will be max a for UB dressing  at wheelchair level, mod cues    Status  Achieved      OT SHORT TERM GOAL #6   Title  Pt and wife will explore options for 3 in 1 commode for pt to use in bathroom or at bedside    Status  Achieved      OT SHORT TERM GOAL #7   Title  Pt will use RUE as stabilizer 25% of the time during basic self care skills with min a and cues.     Status  Achieved      OT SHORT TERM GOAL #8   Title  Pt will be supervion for toilet transfers - 03/29/2018    Status  Achieved      OT SHORT TERM GOAL  #9   TITLE  Pt will tolerate functional activity at ambulatory level for 5 minutes with no rest breaks    Status  Achieved        OT Long Term Goals - 03/15/18 1039      OT LONG TERM GOAL #1   Title  Pt and wife will be mod I with home activity program designed to increase independence in basic self care as well as address cognition -04/26/2018 (renewed)    Status  On-going      OT LONG TERM GOAL #2   Title  Pt will be min a for UB bathing after set up with mod cues    Status  Achieved      OT LONG TERM GOAL #3   Title  Pt will be min a for UB dressing with mod cues    Status  Achieved      OT LONG TERM GOAL #4   Title  Pt will be min a for LB bathing with mod cues    Status  On-going      OT LONG TERM GOAL #5   Title  Pt will be min a for LB dressing with mod cues    Status  On-going      OT LONG TERM GOAL #6   Title  Pt will be min a for toilet transfers in bathroom (able to ambulate 3 steps into bathroom with wife).      Status  Achieved  OT LONG TERM GOAL #7   Title  Pt will be mod a for clothing mmgt with toileting    Status  Achieved      OT LONG TERM GOAL #8   Title  Pt will demonstrate ability to use RUE as stabilizer during basic self care 50% of the time with min a and cues.     Status  On-going            Plan - 03/15/18 1039    Clinical Impression Statement  Pt is progressing toward goals especially in clinic -pt and wife need encouragement to carry over  increased level of independence at home.     Occupational Profile and client history currently impacting functional performance  HLD, HTN, s/p craniotomy, IVC filter with Coumadin, seizures.    Occupational performance deficits (Please refer to evaluation for details):  ADL's;IADL's;Rest and Sleep;Work;Leisure;Social Participation    Rehab Potential  Good    Current Impairments/barriers affecting progress:  severity of deficits    OT Frequency  2x / week    OT Duration  8 weeks    OT Treatment/Interventions  Self-care/ADL training;Moist Heat;Therapeutic exercise;Neuromuscular education;DME and/or AE instruction;Manual Therapy;Functional Mobility Training;Passive range of motion;Therapeutic activities;Splinting;Patient/family education;Balance training    Plan  NMR trunk/RUE, sit to stand, stand to sit, balance, functional mobility, possible use of Give Mohr sling for ambulation    Consulted and Agree with Plan of Care  Patient    Family Member Consulted  [name]       Patient will benefit from skilled therapeutic intervention in order to improve the following deficits and impairments:  Abnormal gait, Decreased activity tolerance, Decreased balance, Decreased cognition, Decreased knowledge of use of DME, Decreased mobility, Decreased range of motion, Decreased safety awareness, Difficulty walking, Decreased strength, Impaired UE functional use, Impaired tone, Impaired sensation, Pain  Visit Diagnosis: Muscle weakness (generalized)  Hemiplegia and hemiparesis following cerebral infarction affecting right dominant side (HCC)  Other symptoms and signs involving the nervous system  Other symptoms and signs involving cognitive functions following cerebral infarction  Other symptoms and signs involving the musculoskeletal system  Unsteadiness on feet  Abnormal posture  Apraxia    Problem List There are no active problems to display for this patient.   Norton Pastel,  OTR/L 03/15/2018, 10:43 AM  [facility] [address] [place], [place], [address] Phone: [phone]   Fax:  [phone]  Name: Tim Stephens MRN: [id number] Date of Birth: 12/20/1955

## 2018-03-19 ENCOUNTER — Encounter: Payer: Self-pay | Admitting: Occupational Therapy

## 2018-03-19 ENCOUNTER — Ambulatory Visit: Payer: BLUE CROSS/BLUE SHIELD | Admitting: Occupational Therapy

## 2018-03-19 ENCOUNTER — Encounter: Payer: Self-pay | Admitting: Speech Pathology

## 2018-03-19 ENCOUNTER — Encounter: Payer: Self-pay | Admitting: Physical Therapy

## 2018-03-19 ENCOUNTER — Ambulatory Visit: Payer: BLUE CROSS/BLUE SHIELD | Admitting: Physical Therapy

## 2018-03-19 VITALS — BP 110/70

## 2018-03-19 DIAGNOSIS — I69318 Other symptoms and signs involving cognitive functions following cerebral infarction: Secondary | ICD-10-CM

## 2018-03-19 DIAGNOSIS — R2689 Other abnormalities of gait and mobility: Secondary | ICD-10-CM

## 2018-03-19 DIAGNOSIS — I69351 Hemiplegia and hemiparesis following cerebral infarction affecting right dominant side: Secondary | ICD-10-CM

## 2018-03-19 DIAGNOSIS — M6281 Muscle weakness (generalized): Secondary | ICD-10-CM | POA: Diagnosis not present

## 2018-03-19 DIAGNOSIS — R2681 Unsteadiness on feet: Secondary | ICD-10-CM

## 2018-03-19 DIAGNOSIS — R293 Abnormal posture: Secondary | ICD-10-CM

## 2018-03-19 DIAGNOSIS — R29818 Other symptoms and signs involving the nervous system: Secondary | ICD-10-CM

## 2018-03-19 DIAGNOSIS — R482 Apraxia: Secondary | ICD-10-CM

## 2018-03-19 DIAGNOSIS — R29898 Other symptoms and signs involving the musculoskeletal system: Secondary | ICD-10-CM

## 2018-03-19 NOTE — Patient Instructions (Signed)
AFO Wearing Time:  1)To start, wear the AFO for 2 hours during standing and walking time.  Remove the AFO and take off socks and stockings to inspect the area.  Make sure there is no redness around brace area and strapping.  2)  If there is redness that doesn't go away within 15 minutes, keep the wearing time the same for the next 1-2 days.  If redness resolves, increase the wearing time by 1-2 hours each day.  If the redness does not resolve, then contact Thayer Ohm at [place] [phone].  3)  If there is no redness, then increase the wearing time by 1-2 hours every day during your awake and active times.  4)  The goal is that you wear the brace for your full awake and active times that you have your shoes on.

## 2018-03-19 NOTE — Therapy (Signed)
[facility] [address] [place], [place], [address] Phone: [phone]   Fax:  [phone]  Occupational Therapy Treatment  Patient Details  Name: Tim Stephens MRN: [id number] Date of Birth: [birth year] Referring Provider: Jenita Seashore, MD   Encounter Date: 03/19/2018  OT End of Session - 03/19/18 1732    Visit Number  19    Number of Visits  24    Date for OT Re-Evaluation  04/26/18    Authorization Type  BC[employer]    Authorization Time Period  pt has been approved for 8 additonal visits for OT by 04/09/2018 (24 total)    Authorization - Visit Number  19    Authorization - Number of Visits  24    OT Start Time  1400    OT Stop Time  1445    OT Time Calculation (min)  45 min    Activity Tolerance  Patient tolerated treatment well    Behavior During Therapy  [facility] for tasks assessed/performed       History reviewed. No pertinent past medical history.  History reviewed. No pertinent surgical history.  Vitals:   03/19/18 1728  BP: 110/70    Subjective Assessment - 03/19/18 1728    Subjective   Patient's wife indicates he is no longer wearing a protective brief during the day,a nd he is walking to the bathroom    Patient is accompained by:  Family member    Pertinent History  MONITOR BP!!!  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  No/denies    Pain Score  0-No pain                   OT Treatments/Exercises (OP) - 03/19/18 0001      Neurological Re-education Exercises   Other Exercises 1  Neuromuscular reeducation to address right upper extremity as active support.  Patient able to actively accept weight through right arm and weight shift to right - using arm as support to rpevent fall.  Patient able to actively  flex and extend elbow now with fewer compensations after initial demonstration.  Patient needs cueing to not hold breath with effort. In standing continuing to work to increase right LE activation  and weight shift toward right for supported right reach.                 OT Short Term Goals - 03/15/18 1038      OT SHORT TERM GOAL #1   Title  Pt and wife will be mod I with HEP for RUE ROM - 01/30/2018 (renewed)    Status  Achieved      OT SHORT TERM GOAL #2   Title  Pt will be min a for grooming with mod cues from wife and intermittent set up prn    Status  Achieved      OT SHORT TERM GOAL #3   Title  Pt will be mod a for UB bathing at wheelchair level with mod cues    Status  Achieved      OT SHORT TERM GOAL #4   Title  Pt will be mod a for LB bathing at sit to stand level  with mod cues and set up    Status  Achieved  OT SHORT TERM GOAL #5   Title  Pt will be max a for UB dressing at wheelchair level, mod cues    Status  Achieved      OT SHORT TERM GOAL #6   Title  Pt and wife will explore options for 3 in 1 commode for pt to use in bathroom or at bedside    Status  Achieved      OT SHORT TERM GOAL #7   Title  Pt will use RUE as stabilizer 25% of the time during basic self care skills with min a and cues.     Status  Achieved      OT SHORT TERM GOAL #8   Title  Pt will be supervion for toilet transfers - 03/29/2018    Status  Achieved      OT SHORT TERM GOAL  #9   TITLE  Pt will tolerate functional activity at ambulatory level for 5 minutes with no rest breaks    Status  Achieved        OT Long Term Goals - 03/15/18 1039      OT LONG TERM GOAL #1   Title  Pt and wife will be mod I with home activity program designed to increase independence in basic self care as well as address cognition -04/26/2018 (renewed)    Status  On-going      OT LONG TERM GOAL #2   Title  Pt will be min a for UB bathing after set up with mod cues    Status  Achieved      OT  LONG TERM GOAL #3   Title  Pt will be min a for UB dressing with mod cues    Status  Achieved      OT LONG TERM GOAL #4   Title  Pt will be min a for LB bathing with mod cues    Status  On-going      OT LONG TERM GOAL #5   Title  Pt will be min a for LB dressing with mod cues    Status  On-going      OT LONG TERM GOAL #6   Title  Pt will be min a for toilet transfers in bathroom (able to ambulate 3 steps into bathroom with wife).      Status  Achieved      OT LONG TERM GOAL #7   Title  Pt will be mod a for clothing mmgt with toileting    Status  Achieved      OT LONG TERM GOAL #8   Title  Pt will demonstrate ability to use RUE as stabilizer during basic self care 50% of the time with min a and cues.     Status  On-going            Plan - 03/19/18 1732    Clinical Impression Statement  Pt is progressing toward goals in home as well as in clinic    Occupational Profile and client history currently impacting functional performance  HLD, HTN, s/p craniotomy, IVC filter with Coumadin, seizures.    Occupational performance deficits (Please refer to evaluation for details):  ADL's;IADL's;Rest and Sleep;Work;Leisure;Social Participation    Rehab Potential  Good    Current Impairments/barriers affecting progress:  severity of deficits    OT Frequency  2x / week    OT Duration  8 weeks    OT Treatment/Interventions  Self-care/ADL training;Moist Heat;Therapeutic exercise;Neuromuscular education;DME and/or AE instruction;Manual Therapy;Functional Mobility  Training;Passive range of motion;Therapeutic activities;Splinting;Patient/family education;Balance training    Plan  NMR trunk/RUE, sit to stand, stand to sit, balance, functional mobility, possible use of Give Mohr sling for ambulation    Clinical Decision Making  Multiple treatment options, significant modification of task necessary    Consulted and Agree with Plan of Care  Patient    Family Member Consulted  [name]        Patient will benefit from skilled therapeutic intervention in order to improve the following deficits and impairments:  Abnormal gait, Decreased activity tolerance, Decreased balance, Decreased cognition, Decreased knowledge of use of DME, Decreased mobility, Decreased range of motion, Decreased safety awareness, Difficulty walking, Decreased strength, Impaired UE functional use, Impaired tone, Impaired sensation, Pain  Visit Diagnosis: Muscle weakness (generalized)  Hemiplegia and hemiparesis following cerebral infarction affecting right dominant side (HCC)  Other symptoms and signs involving the nervous system  Other symptoms and signs involving cognitive functions following cerebral infarction  Other symptoms and signs involving the musculoskeletal system  Unsteadiness on feet  Abnormal posture  Apraxia    Problem List There are no active problems to display for this patient.   Collier Salina, OTR/L 03/19/2018, 5:33 PM  [facility] [address] [place], [place], [address] Phone: [phone]   Fax:  [phone]  Name: FLAY GHOSH MRN: [id number] Date of Birth: 07/16/1955

## 2018-03-20 NOTE — Therapy (Signed)
[facility] [address] [place], [place], [address] Phone: [phone]   Fax:  [phone]  Physical Therapy Treatment  Patient Details  Name: Tim Stephens MRN: [id number] Date of Birth: [birth year] Referring Provider: Jenita Seashore, MD   Encounter Date: 03/19/2018  PT End of Session - 03/20/18 1039    Visit Number  23    Number of Visits  32   8 more visits approved 8/6; 8 additional visits approved 03/01/18   Date for PT Re-Evaluation  04/23/18   recert 2x/wk for 8 weeks on 02/22/18   Authorization Type  BCBS- must request more visits and date extension     Authorization Time Period  8 more visist approved from 7/31 - 12/30, plus additional 8 visits approved 03/01/18-/05/01/18    Authorization - Visit Number  23    Authorization - Number of Visits  32    PT Start Time  1318    PT Stop Time  1402    PT Time Calculation (min)  44 min    Equipment Utilized During Treatment  Gait belt    Activity Tolerance  Patient tolerated treatment well    Behavior During Therapy  [facility] for tasks assessed/performed       History reviewed. No pertinent past medical history.  History reviewed. No pertinent surgical history.  There were no vitals filed for this visit.  Subjective Assessment - 03/20/18 1025    Subjective  No new reports since last visit.    Pertinent History  Skull flap surgery 11/02/17.  PMH significant for hypertension and hyperlipidemia who was admitted to [facility] on 05/30/2017 with a aphasia and right hemibody weakness. CTA was obtained and revealed left M1 cutoff; cerebral angiography with mechanical thrombectomy. The patient's acute hospital course was complicated by worsening cytotoxic edema as well areas of hemorrhagic conversion with left to right midline shift. He was taken to the OR on 06/01/2017 for decompressive left hemicraniectomy. [name] He was found to have a right peroneal, right posterior tibial, right  gastrocnemius,left basilic, and left axillary venous thrombi. He then developed totally occluding superficial thrombophlebitis of the left cephalic vein and right cephalic vein. Venous thrombus then progressed in the left upper arm to the left brachial vein. The patient was not anticoagulated due to recent hemorrhagic conversion of stroke. PEG was placed by Dr. Allayne Butcher on 06/15/2017. There was progression of right lower extremity DVT from the posterior tibial vein to the right common femoral vein. Vascular surgery was consulted and placed an inferior vena cava filter on 06/18/2017. expressive aphasia, verbal apraxia; therapy at [facility] January 2019, then [facility] rehab for 4 weeks, then [facility] therapy PT, OT, speech discharged 09/21/17    Patient Stated Goals  Per wife:  to get strong enough to get upstairs, and use the bathroom on his own; trying to be more mobile outside the home-fishing, more recreational things    Currently in Pain?  No/denies                       [facility] Adult PT Treatment/Exercise - 03/20/18 1025      Ambulation/Gait   Ambulation/Gait  Yes    Ambulation/Gait Assistance  4: Min assist    Ambulation/Gait Assistance Details  Orthotist present to deliver pt's Spry Step AFO.  Initial gait x 20 ft no AFO, with rubber tip quad cane.    Ambulation Distance (Feet)  115 Feet   x 3 (AFO only,  then AFO with heel wedge, then AFO only)   Assistive device  Straight cane   with rubber tip quad cane   Gait Pattern  Step-through pattern;Decreased step length - right;Decreased dorsiflexion - right;Decreased weight shift to right;Poor foot clearance - right;Right genu recurvatum   incr. intensity of recurvatum noted with use of heel wedge   Pre-Gait Activities  PT provides postural cues for upright posture during gait.  Ended session with standing and forward step/weightshift activity, with postural cues for improved step through pattern, using cane.    Gait Comments   With Spry step AFO on RLE, pt noted to have slight knee recurvatum, but improved RLE foot clearance; with use of heel wedge and AFO, pt's recurvatum of knee is more intense, more severe, and discussed with orthotist to not use heelwedge.  Orthotist discused also having brought more flexible AFO, but will not try due to already having some recurvatum with Spry Step AFO.  Provided information on increasing wear time, skin inspection and              PT Education - 03/20/18 1038    Education provided  Yes    Education Details  Instructions on increasing wear time, skin inspection with new Spry Step AFO    Person(s) Educated  Patient    Methods  Explanation;Handout    Comprehension  Verbalized understanding       PT Short Term Goals - 02/23/18 1515      PT SHORT TERM GOAL #1   Title  Pt will perform updated HEP with wife's assistance for improved strength, balance, and gait.  TARGET 03/23/18    Time  4   per recert 02/22/18   Period  Weeks    Status  New    Target Date  03/23/18      PT SHORT TERM GOAL #2   Title  Pt will perform sit<>stand modified independently for improved transfer efficiency and safety.    Baseline  supervision 02/22/18    Time  4    Period  Weeks    Status  New    Target Date  03/23/18      PT SHORT TERM GOAL #3   Title  Pt will ambulate at least 200 ft using SBQC and AFO with supervision for improved gait safety and independence.    Baseline  115 ft [facility] 02/22/18 min assist    Time  4    Period  Weeks    Status  New    Target Date  03/23/18      PT SHORT TERM GOAL #4   Title  Pt will negotiate one step into downstairs bathroom at home, using [facility] with minimal assistance, for improved ADL participation and independence at home.    Baseline  mod assist with [facility]; is using hemiwalker at home    Time  4    Period  Weeks    Status  New    Target Date  03/23/18        PT Long Term Goals - 02/22/18 1328      PT LONG TERM GOAL #1   Title  Pt/wife will  verbalize plans for continued community fitness upon d/c from PT.  TARGET 04/21/18    Baseline  Will continue to address as pt nears d/c    Time  8   per recert 01/15/14/19   Period  Weeks    Status  Revised    Target Date  04/21/18  PT LONG TERM GOAL #2   Title  Pt will improve Berg Balance test to at least 35/56 for decreased fall risk.    Baseline  27/56 02/20/18    Time  8    Period  Weeks    Status  Revised    Target Date  04/21/18      PT LONG TERM GOAL #3   Title  Pt will ambulate at least 300 ft using least restrictive assistive device (SBQC versus rubber tip quad cane), modified independently, for improved safety and independence with gait.    Baseline   115 ft 02/22/18 with min assist with [facility]    Time  8    Period  Weeks    Status  Revised    Target Date  04/21/18      PT LONG TERM GOAL #4   Title  Pt will improve gait velocity to >/= 1.3 ft/sec with use of appropriate AFO and LRAD for improved gait efficiency and safety.    Baseline   0.73 ft/sec 02/20/18    Time  8    Period  Weeks    Status  Revised    Target Date  04/21/18      PT LONG TERM GOAL #5   Title  Pt will negotiate 12 steps using handrail, with supervision, for improved safety and independence with stair negotiation for home.    Baseline  4 steps min assist 02/20/18    Time  8    Period  Weeks    Status  Revised    Target Date  04/21/18      PT LONG TERM GOAL #6   Title  Pt and wife will verbalize/demonstrate maneuvers to decrease residual episodes of BPPV.    Status  Revised    Target Date  04/21/18   8 weeks     PT LONG TERM GOAL #7   Title       Baseline       Time  --    Period  --    Status  --            Plan - 03/20/18 1040    Clinical Impression Statement  Skilled PT session today focused on orthotic training with new Spry Step AFO, delivered by orthotist during session today.  Trialed Spry Step AFO with and without heelwedge to attempt to control R knee recurvatum.  However,  with heelwedge, pt has more intense, significant R knee recurvatum due to pt's increased plantar flexion moment with swing phase of gait (in trials during previous PT sessions, recurvatum has not been this significant).  Overall, pt reports R AFO "feels good" and appears to help patient with RLE foot clearance, step length, and heelstrike.  With fatigue, pt tends to have decreased step length, decreased foot clearance on RLE.  Pt will continue to benefit from skilled PT to address strength, gait training, and balance.    Rehab Potential  Good    Clinical Impairments Affecting Rehab Potential  Good family support; severity of deficits    PT Frequency  2x / week    PT Duration  8 weeks   per recert 02/22/18   PT Treatment/Interventions  ADLs/Self Care Home Management;DME Instruction;Gait training;Stair training;Functional mobility training;Therapeutic activities;Therapeutic exercise;Balance training;Orthotic Fit/Training;Patient/family education;Neuromuscular re-education;Canalith Repostioning;Vestibular;Electrical Stimulation    PT Next Visit Plan  CHECK BP AND MONITOR FOR ORTHOSTASIS.  If PT is first, trial patient walking back into therapy session.  Check STGs-discuss timing for transition  to lesser assistive device at home (currently still using hemiwalker, I believe).  Also, at some point, wife wants to address transition to more standard wheelchair.   Check with pt/wife on how they are doing with OT's activity list for home   PT Home Exercise Plan  [id number] (medBridge)    Consulted and Agree with Plan of Care  Patient;Family member/caregiver    Family Member Consulted  wife       Patient will benefit from skilled therapeutic intervention in order to improve the following deficits and impairments:  Abnormal gait, Decreased activity tolerance, Decreased balance, Decreased knowledge of precautions, Decreased endurance, Decreased knowledge of use of DME, Decreased mobility, Decreased safety awareness,  Difficulty walking, Decreased strength, Impaired tone, Postural dysfunction, Dizziness  Visit Diagnosis: Other abnormalities of gait and mobility  Unsteadiness on feet  Abnormal posture     Problem List There are no active problems to display for this patient.   Leira Regino W. 03/20/2018, 10:46 AM  Gean Maidens., PT   [facility] [address] [place], [place], [address] Phone: [phone]   Fax:  [phone]  Name: Tim Stephens MRN: [id number] Date of Birth: 09-Jan-1956

## 2018-03-21 ENCOUNTER — Encounter: Payer: Self-pay | Admitting: Physical Therapy

## 2018-03-21 ENCOUNTER — Encounter: Payer: Self-pay | Admitting: Occupational Therapy

## 2018-03-21 ENCOUNTER — Ambulatory Visit: Payer: BLUE CROSS/BLUE SHIELD | Admitting: Occupational Therapy

## 2018-03-21 ENCOUNTER — Telehealth: Payer: Self-pay | Admitting: Physical Therapy

## 2018-03-21 ENCOUNTER — Ambulatory Visit: Payer: BLUE CROSS/BLUE SHIELD | Admitting: Physical Therapy

## 2018-03-21 VITALS — BP 130/100

## 2018-03-21 VITALS — BP 130/100 | HR 87

## 2018-03-21 DIAGNOSIS — I69318 Other symptoms and signs involving cognitive functions following cerebral infarction: Secondary | ICD-10-CM

## 2018-03-21 DIAGNOSIS — M6281 Muscle weakness (generalized): Secondary | ICD-10-CM

## 2018-03-21 DIAGNOSIS — R293 Abnormal posture: Secondary | ICD-10-CM

## 2018-03-21 DIAGNOSIS — R2689 Other abnormalities of gait and mobility: Secondary | ICD-10-CM

## 2018-03-21 DIAGNOSIS — R2681 Unsteadiness on feet: Secondary | ICD-10-CM

## 2018-03-21 DIAGNOSIS — R482 Apraxia: Secondary | ICD-10-CM

## 2018-03-21 DIAGNOSIS — R29818 Other symptoms and signs involving the nervous system: Secondary | ICD-10-CM

## 2018-03-21 DIAGNOSIS — R29898 Other symptoms and signs involving the musculoskeletal system: Secondary | ICD-10-CM

## 2018-03-21 DIAGNOSIS — I69351 Hemiplegia and hemiparesis following cerebral infarction affecting right dominant side: Secondary | ICD-10-CM

## 2018-03-21 NOTE — Therapy (Signed)
[facility] [address] [place], [place], [address] Phone: [phone]   Fax:  [phone]  Physical Therapy Treatment  Patient Details  Name: Tim Stephens MRN: [id number] Date of Birth: [birth year] Referring Provider: Scarlette Ar, MD   Encounter Date: 03/21/2018  PT End of Session - 03/21/18 1420    Visit Number  24    Number of Visits  32   8 more visits approved 8/6; 8 additional visits approved 03/01/18   Date for PT Re-Evaluation  53/97/67   recert 2x/wk for 8 weeks on 02/22/18   Authorization Type  BCBS- must request more visits and date extension     Authorization Time Period  8 more visist approved from 7/31 - 12/30, plus additional 8 visits approved 03/01/18-/05/01/18    Authorization - Visit Number  24    Authorization - Number of Visits  32    PT Start Time  1325    PT Stop Time  1403    PT Time Calculation (min)  38 min    Activity Tolerance  Patient tolerated treatment well    Behavior During Therapy  [facility] for tasks assessed/performed       History reviewed. No pertinent past medical history.  History reviewed. No pertinent surgical history.  Vitals:   03/21/18 1420  BP: (!) 130/100  Pulse: 87  SpO2: 97%    Subjective Assessment - 03/21/18 1328    Subjective  Wearing the brace for a few hours at home, no skin issues or pressure points.  Is interested in transitioning to a regular w/c.    Pertinent History  Skull flap surgery 11/02/17.  PMH significant for hypertension and hyperlipidemia who was admitted to [facility] on 05/30/2017 with a aphasia and right hemibody weakness. CTA was obtained and revealed left M1 cutoff; cerebral angiography with mechanical thrombectomy. The patient's acute hospital course was complicated by worsening cytotoxic edema as well areas of hemorrhagic conversion with left to right midline shift. He was taken to the OR on 06/01/2017 for decompressive left hemicraniectomy. [name] He  was found to have a right peroneal, right posterior tibial, right gastrocnemius,left basilic, and left axillary venous thrombi. He then developed totally occluding superficial thrombophlebitis of the left cephalic vein and right cephalic vein. Venous thrombus then progressed in the left upper arm to the left brachial vein. The patient was not anticoagulated due to recent hemorrhagic conversion of stroke. PEG was placed by Dr. [name] on 06/15/2017. There was progression of right lower extremity DVT from the posterior tibial vein to the right common femoral vein. Vascular surgery was consulted and placed an inferior vena cava filter on 06/18/2017. expressive aphasia, verbal apraxia; therapy at [facility] January 2019, then [facility] rehab for 4 weeks, then [facility] therapy PT, OT, speech discharged 09/21/17    Patient Stated Goals  Per wife:  to get strong enough to get upstairs, and use the bathroom on his own; trying to be more mobile outside the home-fishing, more recreational things    Currently in Pain?  No/denies                       [facility] Adult PT Treatment/Exercise - 03/21/18 1330      Transfers   Transfers  Sit to Stand;Stand to [employer] Transfers    Sit to Stand  6: Modified independent (Device/Increase time);With upper extremity assist;From chair/3-in-1    Sit to Stand Details (indicate cue type and reason)  extra time to self correct pushing from mat, not cane.  Performed from mat and chair without cushion to simulate standing from regular, lower manual w/c    Stand to Sit  6: Modified independent (Device/Increase time)    Stand Pivot Transfers  5: Supervision    Stand Pivot Transfer Details (indicate cue type and reason)  to L and R x 2 with supervision - one episode of LOB with min A to correct      Ambulation/Gait   Ambulation/Gait  Yes    Ambulation/Gait Assistance  5: Supervision;4: Min guard    Ambulation/Gait Assistance Details  AFO donned, use of cane  with quad tip making left and right turns; required standing rest breaks as pt fatigued; also as pt fatigued noted increased R genu recurvatum and required increased hands on assistance at R shoulder for upright posture over BOS and balance during 1-2 episodes of R foot catching on floor    Ambulation Distance (Feet)  230 Feet    Assistive device  Straight cane    Gait Pattern  Step-through pattern;Decreased stride length;Decreased dorsiflexion - right;Right genu recurvatum;Trunk flexed;Poor foot clearance - right    Ambulation Surface  Level;Indoor      Therapeutic Activites    Therapeutic Activities  Other Therapeutic Activities    Other Therapeutic Activities  Wife reports current reclining w/c provided by [employer] - 7 months since pt received.  Has a 10 month rent to own policy.  Measured pt for manual w/c and specs given to wife.  Wife to contact medical supply company about changing from reclining w/c to manual w/c.  Also discussed changing from hemiwalker to cane; hemiwalker was donated.  Will request order for cane from physiatrist.             PT Education - 03/21/18 1419    Education provided  Yes    Education Details  process for changing from reclining w/c to manual and measured for specs; process for obtaining cane    Person(s) Educated  Patient;Spouse    Methods  Explanation    Comprehension  Verbalized understanding       PT Short Term Goals - 03/21/18 1401      PT SHORT TERM GOAL #1   Title  Pt will perform updated HEP with wife's assistance for improved strength, balance, and gait.  TARGET 03/23/18    Time  4   per recert 6/96/29   Period  Weeks    Status  On-going      PT SHORT TERM GOAL #2   Title  Pt will perform sit<>stand modified independently for improved transfer efficiency and safety.    Baseline  mod I sit to stand; supervision for stand pivot with cane and AFO    Time  4    Period  Weeks    Status  Partially Met      PT  SHORT TERM GOAL #3   Title  Pt will ambulate at least 200 ft using SBQC and AFO with supervision for improved gait safety and independence.    Baseline  230' with quad based cane, AFO, supervision making left and right turns with intermittent min A when R foot catches    Time  4    Period  Weeks    Status  Partially Met      PT SHORT TERM GOAL #4   Title  Pt will negotiate one step into downstairs bathroom at home, using [facility] with minimal  assistance, for improved ADL participation and independence at home.    Baseline  mod assist with [facility]; is using hemiwalker at home    Time  4    Period  Weeks    Status  On-going        PT Long Term Goals - 02/22/18 1328      PT LONG TERM GOAL #1   Title  Pt/wife will verbalize plans for continued community fitness upon d/c from PT.  TARGET 04/21/18    Baseline  Will continue to address as pt nears d/c    Time  8   per recert 10/09/61/84   Period  Weeks    Status  Revised    Target Date  04/21/18      PT LONG TERM GOAL #2   Title  Pt will improve Berg Balance test to at least 35/56 for decreased fall risk.    Baseline  27/56 02/20/18    Time  8    Period  Weeks    Status  Revised    Target Date  04/21/18      PT LONG TERM GOAL #3   Title  Pt will ambulate at least 300 ft using least restrictive assistive device (SBQC versus rubber tip quad cane), modified independently, for improved safety and independence with gait.    Baseline   115 ft 02/22/18 with min assist with [facility]    Time  8    Period  Weeks    Status  Revised    Target Date  04/21/18      PT LONG TERM GOAL #4   Title  Pt will improve gait velocity to >/= 1.3 ft/sec with use of appropriate AFO and LRAD for improved gait efficiency and safety.    Baseline   0.73 ft/sec 02/20/18    Time  8    Period  Weeks    Status  Revised    Target Date  04/21/18      PT LONG TERM GOAL #5   Title  Pt will negotiate 12 steps using handrail, with supervision, for improved safety and  independence with stair negotiation for home.    Baseline  4 steps min assist 02/20/18    Time  8    Period  Weeks    Status  Revised    Target Date  04/21/18      PT LONG TERM GOAL #6   Title  Pt and wife will verbalize/demonstrate maneuvers to decrease residual episodes of BPPV.    Status  Revised    Target Date  04/21/18   8 weeks     PT LONG TERM GOAL #7   Title       Baseline       Time  --    Period  --    Status  --            Plan - 03/21/18 1421    Clinical Impression Statement  Initiated assessment of progress towards STG now that pt has obtained AFO.  Pt has partially met stand pivot transfer and indoor ambulation goals with cane and AFO.  Continues to require intermittent min A for balance and R foot clearance.  Discussed and will proceed with changing out w/c and obtaining a lesser AD for ambulation.  Will continue to assess at next session.    Rehab Potential  Good    Clinical Impairments Affecting Rehab Potential  Good family support; severity of deficits  PT Frequency  2x / week    PT Duration  8 weeks   per recert 2/82/08   PT Treatment/Interventions  ADLs/Self Care Home Management;DME Instruction;Gait training;Stair training;Functional mobility training;Therapeutic activities;Therapeutic exercise;Balance training;Orthotic Fit/Training;Patient/family education;Neuromuscular re-education;Canalith Repostioning;Vestibular;Electrical Stimulation    PT Next Visit Plan  CHECK BP AND MONITOR FOR ORTHOSTASIS.  get order for cane - give to pt; did they talk to supply store about w/c?  Finish checking STG.  household ambulation training with cane (vary surfaces, obstacles, dual task).     Check with pt/wife on how they are doing with OT's activity list for home   PT Home Exercise Plan  [id number] (medBridge)    Consulted and Agree with Plan of Care  Patient;Family member/caregiver    Family Member Consulted  wife       Patient will benefit from skilled therapeutic  intervention in order to improve the following deficits and impairments:  Abnormal gait, Decreased activity tolerance, Decreased balance, Decreased knowledge of precautions, Decreased endurance, Decreased knowledge of use of DME, Decreased mobility, Decreased safety awareness, Difficulty walking, Decreased strength, Impaired tone, Postural dysfunction, Dizziness  Visit Diagnosis: Muscle weakness (generalized)  Unsteadiness on feet  Other abnormalities of gait and mobility  Abnormal posture     Problem List There are no active problems to display for this patient.   Rico Junker, PT, DPT 03/21/18    2:25 PM    [facility] [address] [place], [place], [address] Phone: [phone]   Fax:  [phone]  Name: Tim Stephens MRN: [id number] Date of Birth: 1956/03/15

## 2018-03-21 NOTE — Therapy (Signed)
[facility] [address] [place], [place], [address] Phone: [phone]   Fax:  [phone]  Occupational Therapy Treatment  Patient Details  Name: Tim Stephens MRN: [id number] Date of Birth: September 09, 1955 Referring Provider: Jenita Seashore, MD   Encounter Date: 03/21/2018  OT End of Session - 03/21/18 1641    Visit Number  20    Number of Visits  24    Date for OT Re-Evaluation  04/26/18    Authorization Type  BCBS    Authorization Time Period  pt has been approved for 8 additonal visits for OT by 04/09/2018 (24 total)    Authorization - Visit Number  20    Authorization - Number of Visits  24    OT Start Time  1405    OT Stop Time  1445    OT Time Calculation (min)  40 min    Activity Tolerance  Patient tolerated treatment well    Behavior During Therapy  [facility] for tasks assessed/performed       History reviewed. No pertinent past medical history.  History reviewed. No pertinent surgical history.  Vitals:   03/21/18 1636  BP: (!) 130/100    Subjective Assessment - 03/21/18 1636    Subjective   Patient with slightly elevated BP at end of PT session after walking 2 laps around gym    Patient is accompained by:  Family member    Pertinent History  MONITOR BP!!!  05/30/2017 admitted to [facility] with LMI cutoff, s/p thromectomy with resultant L CVA due mulitple thromboses;  IVC filter with Coumadin, 06/01/2017 craniotomy    Patient Stated Goals  unable to state due to aphasia    Currently in Pain?  No/denies    Pain Score  0-No pain                   OT Treatments/Exercises (OP) - 03/21/18 1638      Neurological Re-education Exercises   Other Exercises 1  Neuromuscular reeducation at supine level (due to elevated BP) to address isolated shoulder, elbow, forearm, wrist motion.,  Worked on place and hold exercises to address muscle balance around shoulder girdle - then active asssited reach pattern shoulder  flex with elbow extension - decreasing effort and accessing correct musculature.  In sitting worked on long arm weight bearing then followed with isoalted digit flexion and extension,                 OT Short Term Goals - 03/15/18 1038      OT SHORT TERM GOAL #1   Title  Pt and wife will be mod I with HEP for RUE ROM - 01/30/2018 (renewed)    Status  Achieved      OT SHORT TERM GOAL #2   Title  Pt will be min a for grooming with mod cues from wife and intermittent set up prn    Status  Achieved      OT SHORT TERM GOAL #3   Title  Pt will be mod a for UB bathing at wheelchair level with mod cues    Status  Achieved      OT SHORT TERM GOAL #4   Title  Pt will be mod a for LB bathing at sit to stand level  with mod cues and set up    Status  Achieved      OT SHORT TERM GOAL #5   Title  Pt will be max a for  UB dressing at wheelchair level, mod cues    Status  Achieved      OT SHORT TERM GOAL #6   Title  Pt and wife will explore options for 3 in 1 commode for pt to use in bathroom or at bedside    Status  Achieved      OT SHORT TERM GOAL #7   Title  Pt will use RUE as stabilizer 25% of the time during basic self care skills with min a and cues.     Status  Achieved      OT SHORT TERM GOAL #8   Title  Pt will be supervion for toilet transfers - 03/29/2018    Status  Achieved      OT SHORT TERM GOAL  #9   TITLE  Pt will tolerate functional activity at ambulatory level for 5 minutes with no rest breaks    Status  Achieved        OT Long Term Goals - 03/15/18 1039      OT LONG TERM GOAL #1   Title  Pt and wife will be mod I with home activity program designed to increase independence in basic self care as well as address cognition -04/26/2018 (renewed)    Status  On-going      OT LONG TERM GOAL #2   Title  Pt will be min a for UB bathing after set up with mod cues    Status  Achieved      OT LONG TERM GOAL #3   Title  Pt will be min a for UB dressing with mod cues     Status  Achieved      OT LONG TERM GOAL #4   Title  Pt will be min a for LB bathing with mod cues    Status  On-going      OT LONG TERM GOAL #5   Title  Pt will be min a for LB dressing with mod cues    Status  On-going      OT LONG TERM GOAL #6   Title  Pt will be min a for toilet transfers in bathroom (able to ambulate 3 steps into bathroom with wife).      Status  Achieved      OT LONG TERM GOAL #7   Title  Pt will be mod a for clothing mmgt with toileting    Status  Achieved      OT LONG TERM GOAL #8   Title  Pt will demonstrate ability to use RUE as stabilizer during basic self care 50% of the time with min a and cues.     Status  On-going            Plan - 03/21/18 1642    Clinical Impression Statement  Pt is progressing toward goals in home as well as in clinic    Occupational Profile and client history currently impacting functional performance  HLD, HTN, s/p craniotomy, IVC filter with Coumadin, seizures.    Occupational performance deficits (Please refer to evaluation for details):  ADL's;IADL's;Rest and Sleep;Work;Leisure;Social Participation    Rehab Potential  Good    Current Impairments/barriers affecting progress:  severity of deficits    OT Frequency  2x / week    OT Duration  8 weeks    OT Treatment/Interventions  Self-care/ADL training;Moist Heat;Therapeutic exercise;Neuromuscular education;DME and/or AE instruction;Manual Therapy;Functional Mobility Training;Passive range of motion;Therapeutic activities;Splinting;Patient/family education;Balance training    Plan  NMR trunk/RUE, sit  to stand, stand to sit, balance, functional mobility, possible use of Give [employer]r sling for ambulation    Clinical Decision Making  Multiple treatment options, significant modification of task necessary    Consulted and Agree with Plan of Care  Patient    Family Member Consulted  [name]       Patient will benefit from skilled therapeutic intervention in order to  improve the following deficits and impairments:  Abnormal gait, Decreased activity tolerance, Decreased balance, Decreased cognition, Decreased knowledge of use of DME, Decreased mobility, Decreased range of motion, Decreased safety awareness, Difficulty walking, Decreased strength, Impaired UE functional use, Impaired tone, Impaired sensation, Pain  Visit Diagnosis: Muscle weakness (generalized)  Unsteadiness on feet  Abnormal posture  Hemiplegia and hemiparesis following cerebral infarction affecting right dominant side (HCC)  Other symptoms and signs involving the nervous system  Other symptoms and signs involving cognitive functions following cerebral infarction  Other symptoms and signs involving the musculoskeletal system  Apraxia    Problem List There are no active problems to display for this patient.   Collier Salina, OTR/L 03/21/2018, 4:43 PM  [facility] [address] [place], [place], [address] Phone: [phone]   Fax:  [phone]  Name: Tim Stephens MRN: [id number] Date of Birth: Nov 15, 1955

## 2018-03-21 NOTE — Telephone Encounter (Signed)
Fax request sent to Dr. Renato Gails for order for single point cane for home use.  Dierdre Highman, PT, DPT 03/21/18    2:29 PM

## 2018-03-22 ENCOUNTER — Ambulatory Visit: Payer: BLUE CROSS/BLUE SHIELD

## 2018-03-22 DIAGNOSIS — M6281 Muscle weakness (generalized): Secondary | ICD-10-CM | POA: Diagnosis not present

## 2018-03-22 DIAGNOSIS — R482 Apraxia: Secondary | ICD-10-CM

## 2018-03-22 DIAGNOSIS — R4701 Aphasia: Secondary | ICD-10-CM

## 2018-03-22 NOTE — Patient Instructions (Signed)
  Use the "fill in the blank" hints to help Rosanne AshingJim get the right words. Afterwards, leave off the last TWO words and give Rosanne AshingJim the chance to fill in those words.  Singing is a good thing to continue to do - to music that is well-memorized.

## 2018-03-22 NOTE — Therapy (Signed)
[facility] [address] [place], [place], [address] Phone: [phone]   Fax:  [phone]  Speech Language Pathology Treatment  Patient Details  Name: Tim Stephens MRN: [id number] Date of Birth: 02/25/1956 Referring Provider: Dr. Scarlette Ar   Encounter Date: 03/22/2018  End of Session - 03/22/18 1442    Visit Number  21    Number of Visits  26    Date for SLP Re-Evaluation  03/10/18    Authorization - Visit Number  21    Authorization - Number of Visits  25    SLP Start Time  8891    SLP Stop Time   1444    SLP Time Calculation (min)  41 min    Activity Tolerance  Patient tolerated treatment well       History reviewed. No pertinent past medical history.  History reviewed. No pertinent surgical history.  There were no vitals filed for this visit.  Subjective Assessment - 03/22/18 1415    Subjective  [name] wife gave authorization to release last two therapy (progress) notes for wife to take to [facility]. Full ST chart to follow via mail, as wife has signed release of information form which was faxed to [facility]    Patient is accompained by:  Family member   wife   Currently in Pain?  No/denies            ADULT SLP TREATMENT - 03/22/18 1418      General Information   Behavior/Cognition  Alert;Cooperative;Pleasant mood      Treatment Provided   Treatment provided  Cognitive-Linquistic      Cognitive-Linquistic Treatment   Treatment focused on  Aphasia;Apraxia    Skilled Treatment  "It's been tough for [place] to communicate without that iPad (SGD)." SLP with pt education today re: language rich environment to assist pt with rehab for aphasia and verbal apraxia. According to wife pt was using iPad and this alleviated some pressure of disordered communication between the two of them. Pt cont to await his Lingraphica touch talk device. He will be d/c'd from this skilled ST due to beginning ST at [facility].        Assessment / Recommendations / Plan   Plan  Other (Comment)   pt beginning [facility] ST     Progression Toward Goals   Progression toward goals  Progressing toward goals       SLP Education - 03/22/18 1440    Education provided  Yes    Education Details  details of a language-rich environment for pt, when/if to return to this center, what may occur in [place] at [facility], status of Lingraphica device, why a language rich environment is helpful/desired for pt    Person(s) Educated  Patient;Spouse    Methods  Explanation    Comprehension  Verbalized understanding       SLP Short Term Goals - 03/01/18 1458      SLP SHORT TERM GOAL #1   Title  Pt will ID object f:5 to sentence description with 90% accuracy and occasional min A    Status  Not Met      SLP SHORT TERM GOAL #2   Title  Pt will complete automatic speech tasks with 70% accuracy and visual cues over 3 sessions    Status  Not Met      SLP SHORT TERM GOAL #3   Title  Pt will utilize multimodal communication and AAC to communicate wants and answer questions with  70% accuracy and occasional min A over 3 sessions    Status  Not Met      SLP SHORT TERM GOAL #4   Title  Pt will write/type at word level with 80% accuracy and occasional min A over 2 sessions    Status  Not Met       SLP Long Term Goals - 03/22/18 1424      SLP LONG TERM GOAL #1   Title  Pt will comprehend mildly complex conversation over 8 minute conversation with appropriate responses (mulitmodal) and 2 or less requests for repetition    Status  Achieved      SLP LONG TERM GOAL #2   Title  Pt will utilize multimodal communication/ACC to produce 2 word response to conversation/question 8/10x with occasional min A    Status  Achieved      SLP LONG TERM GOAL #3   Title  Pt will write 1 word  with 70% accuracy and occasional min A    Baseline  --    Time  --    Period  Weeks    Status  Not Met      SLP LONG TERM GOAL #4   Title  Pt will produce 1-2 syllable  words in phrase completion task with 70% accuracy and occassional mod A    Baseline  --    Time  --    Period  --    Status  Partially Met      SLP LONG TERM GOAL #5   Title  pt will use SGD to indicate needs, questions, and desires with rare min A    Time  --    Period  --    Status  Partially Met      SLP LONG TERM GOAL #6   Title  pt will demo basic understanding of how to make new icons with SGD, with rare min A    Time  --    Period  --    Status  Partially Met       Plan - 03/22/18 1446    Clinical Impression Statement  Severe non fluent aphasia persists. Pt cont to wait for Lingraphica device so today was spent educating wife/pt on how to enrich pt's language skills at home (see SLP/ST education). Pt to be d/c'd at this time to begin ST at [facility].     Speech Therapy Frequency  --    Duration  --   8 more sessions beginning week of 02/26/18   Treatment/Interventions  Language facilitation;Environmental controls;Cueing hierarchy;Oral motor exercises;SLP instruction and feedback;Compensatory strategies;Functional tasks;Cognitive reorganization;Internal/external aids;Multimodal communcation approach;Patient/family education;Other (comment)    Potential to Achieve Goals  Good    Potential Considerations  Severity of impairments       Patient will benefit from skilled therapeutic intervention in order to improve the following deficits and impairments:   Aphasia  Verbal apraxia   SPEECH THERAPY DISCHARGE SUMMARY  Visits from Start of Care: 21  Current functional level related to goals / functional outcomes: See goal update above. Pt made gains mostly with rote speech and verbal expression with consistent SLP cues. His receptive language skills appear to have resolved more (in relative terms) than his expressive language skills.  Remaining deficits: Pt with cont'd severe expressive aphasia, some degree of receptive aphasia, and verbal apraxia.    Education /  Equipment: Creating a Tree surgeon for pt.   Plan: Patient agrees to discharge.  Patient goals were partially  met. Patient is being discharged due to the patient's request.  ?????(pt beginning ST at [facility]). Pt may want to re-eval here at this facility in late spring 2020, when school year/clinic will end.       Problem List There are no active problems to display for this patient.   [facility] ,[place], [place]  03/22/2018, 4:51 PM  [facility] [address] [place], [place], [address] Phone: [phone]   Fax:  [phone]   Name: CAIDIN HEIDENREICH MRN: [id number] Date of Birth: 09/01/1955

## 2018-03-28 ENCOUNTER — Ambulatory Visit: Payer: BLUE CROSS/BLUE SHIELD | Admitting: Physical Therapy

## 2018-03-28 ENCOUNTER — Encounter: Payer: Self-pay | Admitting: Occupational Therapy

## 2018-03-28 ENCOUNTER — Ambulatory Visit: Payer: BLUE CROSS/BLUE SHIELD | Admitting: Occupational Therapy

## 2018-03-28 ENCOUNTER — Encounter: Payer: Self-pay | Admitting: Physical Therapy

## 2018-03-28 VITALS — BP 134/97 | HR 86

## 2018-03-28 DIAGNOSIS — R2681 Unsteadiness on feet: Secondary | ICD-10-CM

## 2018-03-28 DIAGNOSIS — I69351 Hemiplegia and hemiparesis following cerebral infarction affecting right dominant side: Secondary | ICD-10-CM

## 2018-03-28 DIAGNOSIS — R29818 Other symptoms and signs involving the nervous system: Secondary | ICD-10-CM

## 2018-03-28 DIAGNOSIS — I69318 Other symptoms and signs involving cognitive functions following cerebral infarction: Secondary | ICD-10-CM

## 2018-03-28 DIAGNOSIS — R482 Apraxia: Secondary | ICD-10-CM

## 2018-03-28 DIAGNOSIS — R2689 Other abnormalities of gait and mobility: Secondary | ICD-10-CM

## 2018-03-28 DIAGNOSIS — M6281 Muscle weakness (generalized): Secondary | ICD-10-CM

## 2018-03-28 DIAGNOSIS — R29898 Other symptoms and signs involving the musculoskeletal system: Secondary | ICD-10-CM

## 2018-03-28 DIAGNOSIS — R293 Abnormal posture: Secondary | ICD-10-CM

## 2018-03-28 NOTE — Therapy (Signed)
[facility] [address] [place], [place], [address] Phone: [phone]   Fax:  [phone]  Physical Therapy Treatment  Patient Details  Name: Tim Stephens MRN: [id number] Date of Birth: March 29, 1956 Referring Provider: Scarlette Ar, MD   Encounter Date: 03/28/2018  PT End of Session - 03/28/18 2116    Visit Number  25    Number of Visits  32    Date for PT Re-Evaluation  04/22/18    Authorization Type  [employer]- must request more visits and date extension     Authorization Time Period  additional 8 visits approved 03/01/18-/05/01/18    Authorization - Visit Number  25    Authorization - Number of Visits  32    PT Start Time  4462    PT Stop Time  1445    PT Time Calculation (min)  33 min    Activity Tolerance  Patient tolerated treatment well    Behavior During Therapy  [facility] for tasks assessed/performed       History reviewed. No pertinent past medical history.  History reviewed. No pertinent surgical history.  Vitals:   03/28/18 1436 03/28/18 1440  BP: (!) 148/106 (!) 134/97  Pulse: 86 86    Subjective Assessment - 03/28/18 1424    Subjective  Checked on changing out the w/c; due to [name] chair is not rented and patient owns it.    Pertinent History  Skull flap surgery 11/02/17.  PMH significant for hypertension and hyperlipidemia who was admitted to [facility] on 05/30/2017 with a aphasia and right hemibody weakness. CTA was obtained and revealed left M1 cutoff; cerebral angiography with mechanical thrombectomy. The patient's acute hospital course was complicated by worsening cytotoxic edema as well areas of hemorrhagic conversion with left to right midline shift. He was taken to the OR on 06/01/2017 for decompressive left hemicraniectomy. [name] He was found to have a right peroneal, right posterior tibial, right gastrocnemius,left basilic, and left axillary venous thrombi. He then developed totally occluding superficial  thrombophlebitis of the left cephalic vein and right cephalic vein. Venous thrombus then progressed in the left upper arm to the left brachial vein. The patient was not anticoagulated due to recent hemorrhagic conversion of stroke. PEG was placed by Dr. [name] on 06/15/2017. There was progression of right lower extremity DVT from the posterior tibial vein to the right common femoral vein. Vascular surgery was consulted and placed an inferior vena cava filter on 06/18/2017. expressive aphasia, verbal apraxia; therapy at [facility] January 2019, then [facility] rehab for 4 weeks, then [facility] therapy PT, OT, speech discharged 09/21/17    Patient Stated Goals  Per wife:  to get strong enough to get upstairs, and use the bathroom on his own; trying to be more mobile outside the home-fishing, more recreational things    Currently in Pain?  No/denies                       [facility] Adult PT Treatment/Exercise - 03/28/18 1436      Transfers   Transfers  Sit to Stand;Stand Pivot Transfers;Stand to Sit    Sit to Stand  6: Modified independent (Device/Increase time);With upper extremity assist;From chair/3-in-1    Stand to Sit  6: Modified independent (Device/Increase time)    Stand Pivot Transfers  5: Supervision      Ambulation/Gait   Ambulation/Gait  Yes    Ambulation/Gait Assistance  4: Min guard   maintain shoulder retraction  and depression   Ambulation/Gait Assistance Details  AFO donned, quad tip cane.  Therapist on R side providing facilitation of shoulder retraction, depression and posterior tilt of scapula during gait for improved upright posture and weight shifting during gait    Ambulation Distance (Feet)  115 Feet    Assistive device  Straight cane    Gait Pattern  Step-through pattern;Decreased stride length;Decreased dorsiflexion - right;Right genu recurvatum;Trunk flexed;Poor foot clearance - right    Ambulation Surface  Level;Indoor    Stairs  Yes    Stairs Assistance   4: Min assist    Stairs Assistance Details (indicate cue type and reason)  First set of stairs pt performed step to sequencing ascending with LLE per therapist cues and then self-selecting to ascend and descend leading with RLE.   During final set of stairs pt cued to perform alternating sequence to ascend and descend - pt performed with supervision-min A with one UE support on rail    Stair Management Technique  Step to pattern;Forwards;One rail Left;Two rails;Alternating pattern    Number of Stairs  12    Height of Stairs  6    Curb  4: Min assist    Curb Details (indicate cue type and reason)  with quad tip cane and AFO, x 2 reps ascending and descending leading with RLE    Gait Comments  Following gait, curb and first set of stairs pt diastolic BP increased to 998; with prolonged seated rest break diastolic decreased to 97 before attempting stairs with alternating sequence      Therapeutic Activites    Therapeutic Activities  Other Therapeutic Activities    Other Therapeutic Activities  Unable to trade in reclining w/c for regular manual w/c due to now owning reclining w/c.  Wife may look into purchasing a manual w/c for patient; discussed size and features to look for in basic lightweight manual w/c.             PT Education - 03/28/18 2116    Education provided  Yes    Education Details  manual w/c recommendations if they purchase out of pocket    Person(s) Educated  Patient;Spouse    Methods  Explanation    Comprehension  Verbalized understanding       PT Short Term Goals - 03/28/18 2121      PT SHORT TERM GOAL #1   Title  Pt will perform updated HEP with wife's assistance for improved strength, balance, and gait.  TARGET 03/23/18    Time  4   per recert 3/38/25   Period  Weeks    Status  Achieved      PT SHORT TERM GOAL #2   Title  Pt will perform sit<>stand modified independently for improved transfer efficiency and safety.    Baseline  mod I sit to stand; supervision  for stand pivot with cane and AFO    Time  4    Period  Weeks    Status  Partially Met      PT SHORT TERM GOAL #3   Title  Pt will ambulate at least 200 ft using SBQC and AFO with supervision for improved gait safety and independence.    Baseline  230' with quad based cane, AFO, supervision making left and right turns with intermittent min A when R foot catches    Time  4    Period  Weeks    Status  Partially Met      PT SHORT  TERM GOAL #4   Title  Pt will negotiate one step into downstairs bathroom at home, using [facility] with minimal assistance, for improved ADL participation and independence at home.    Baseline  mod assist with [facility]; is using hemiwalker at home    Time  4    Period  Weeks    Status  Achieved        PT Long Term Goals - 03/28/18 2117      PT LONG TERM GOAL #1   Title  Pt/wife will verbalize plans for continued community fitness upon d/c from PT.      Baseline  Will continue to address as pt nears d/c    Time  8   per recert 02/10/65/29   Period  Weeks    Status  Revised    Target Date  04/22/18      PT LONG TERM GOAL #2   Title  Pt will improve Berg Balance test to at least 35/56 for decreased fall risk.    Baseline  27/56 02/20/18    Time  8    Period  Weeks    Status  Revised    Target Date  04/22/18      PT LONG TERM GOAL #3   Title  Pt will ambulate at least 300 ft using least restrictive assistive device (SBQC versus rubber tip quad cane), modified independently, for improved safety and independence with gait.    Baseline   115 ft 02/22/18 with min assist with [facility]    Time  8    Period  Weeks    Status  Revised    Target Date  04/22/18      PT LONG TERM GOAL #4   Title  Pt will improve gait velocity to >/= 1.3 ft/sec with use of appropriate AFO and LRAD for improved gait efficiency and safety.    Baseline   0.73 ft/sec 02/20/18    Time  8    Period  Weeks    Status  Revised    Target Date  04/22/18      PT LONG TERM GOAL #5   Title  Pt will  negotiate 12 steps using handrail, with supervision, for improved safety and independence with stair negotiation for home.    Baseline  4 steps min assist 02/20/18    Time  8    Period  Weeks    Status  Revised    Target Date  04/22/18      PT LONG TERM GOAL #6   Title  Pt and wife will verbalize/demonstrate maneuvers to decrease residual episodes of BPPV.    Status  Revised    Target Date  04/22/18      PT LONG TERM GOAL #7   Title       Baseline               Plan - 03/28/18 2118    Clinical Impression Statement  Completed assessment of STG with one step negotiation to access downstairs bathroom with use of cane with quad tip instead of hemi walker.  Wife reports pt is ambulating more around the house and has been going up stairs to use shower/bathroom.  Continued assessment with stair negotiation with one rail with pt demonstrating significant progress to be able to ascend and descend with alternating sequence and close supervision.  Pt continues to demonstrate slightly elevated BP after performing prolonged ambulation and stairs.  Also provided pt and wife information for  purchasing manual w/c if they are able to financially.  Pt has met 2/4 STG, partially meeting other two.  Will continue to progress towards LTG.      Rehab Potential  Good    Clinical Impairments Affecting Rehab Potential  Good family support; severity of deficits    PT Frequency  2x / week    PT Duration  8 weeks   per recert 3/55/21   PT Treatment/Interventions  ADLs/Self Care Home Management;DME Instruction;Gait training;Stair training;Functional mobility training;Therapeutic activities;Therapeutic exercise;Balance training;Orthotic Fit/Training;Patient/family education;Neuromuscular re-education;Canalith Repostioning;Vestibular;Electrical Stimulation    PT Next Visit Plan  CHECK BP AND MONITOR FOR ORTHOSTASIS/OR HIGH BP AFTER ACTIVITY.  Stair negotiation alternating sequence, household and outdoor over pavement  ambulation training with cane (vary surfaces, obstacles, dual task).  (need to follow up with Dr. Pricilla Holm about cane order)   Check with pt/wife on how they are doing with OT's activity list for home   PT Home Exercise Plan  [id number] (medBridge)    Consulted and Agree with Plan of Care  Patient;Family member/caregiver    Family Member Consulted  wife       Patient will benefit from skilled therapeutic intervention in order to improve the following deficits and impairments:  Abnormal gait, Decreased activity tolerance, Decreased balance, Decreased knowledge of precautions, Decreased endurance, Decreased knowledge of use of DME, Decreased mobility, Decreased safety awareness, Difficulty walking, Decreased strength, Impaired tone, Postural dysfunction, Dizziness  Visit Diagnosis: Muscle weakness (generalized)  Unsteadiness on feet  Other abnormalities of gait and mobility  Abnormal posture     Problem List There are no active problems to display for this patient.   Rico Junker, PT, DPT 03/28/18    9:25 PM    [facility] [address] [place], [place], [address] Phone: [phone]   Fax:  [phone]  Name: CORY KITT MRN: [id number] Date of Birth: Mar 27, 1956

## 2018-03-28 NOTE — Therapy (Signed)
[facility] [address] [place], [place], [address] Phone: [phone]   Fax:  [phone]  Occupational Therapy Treatment  Patient Details  Name: Tim Stephens MRN: [id number] Date of Birth: 10/19/1955 Referring Provider: Jenita Seashore, MD   Encounter Date: 03/28/2018  OT End of Session - 03/28/18 1557    Visit Number  21    Number of Visits  24    Date for OT Re-Evaluation  04/26/18    Authorization Type  [employer]    Authorization Time Period  pt has been approved for 8 additonal visits for OT by 04/09/2018 (24 total)    Authorization - Visit Number  21    Authorization - Number of Visits  24    OT Start Time  1447    OT Stop Time  1530    OT Time Calculation (min)  43 min    Activity Tolerance  Patient tolerated treatment well    Behavior During Therapy  [facility] for tasks assessed/performed       History reviewed. No pertinent past medical history.  History reviewed. No pertinent surgical history.  There were no vitals filed for this visit.  Subjective Assessment - 03/28/18 1447    Subjective   Patient with slightly elevated BP after climbing steps    Patient is accompained by:  Family member    Pertinent History  MONITOR BP!!!  05/30/2017 admitted to [facility] with LMI cutoff, s/p thromectomy with resultant L CVA due mulitple thromboses;  IVC filter with Coumadin, 06/01/2017 craniotomy    Patient Stated Goals  unable to state due to aphasia    Currently in Pain?  No/denies    Pain Score  0-No pain                   OT Treatments/Exercises (OP) - 03/28/18 1546      Neurological Re-education Exercises   Other Exercises 1  Neuromuscular reeducation to address midline orientation, postural control during transitional movements, and body on arm movement to gain stability in proximal RUE.  Patient with report of pain with weight bearing with increased demand for extesnion in right wrist.  Provided gentle  traction and realignment and able to retunr to weight bearing with slightly less extension without further report of pain.  Educated patient and wife about wrist alignment, and potential for malalignment with atypical muscle forces.  Issued wrist brace to be worn to protect and support right wrist as patient is becoming much more mobile.  Patient may benefit from a more custom wrist orthosis in the future.       Other Exercises 2  Worked on supported sidelying to supine to address proximal stability in RUE.  Patient relies heavily on left extrmeities for most functional mobility tasks - so utilized more dependent position to force activation in R limbs.               OT Education - 03/28/18 1557    Education provided  Yes    Education Details  wrist brace to support carpal bones - RUE    Person(s) Educated  Patient;Spouse    Methods  Explanation    Comprehension  Verbalized understanding       OT Short Term Goals - 03/15/18 1038      OT SHORT TERM GOAL #1   Title  Pt and wife will be mod I with HEP for RUE ROM - 01/30/2018 (renewed)    Status  Achieved  OT SHORT TERM GOAL #2   Title  Pt will be min a for grooming with mod cues from wife and intermittent set up prn    Status  Achieved      OT SHORT TERM GOAL #3   Title  Pt will be mod a for UB bathing at wheelchair level with mod cues    Status  Achieved      OT SHORT TERM GOAL #4   Title  Pt will be mod a for LB bathing at sit to stand level  with mod cues and set up    Status  Achieved      OT SHORT TERM GOAL #5   Title  Pt will be max a for UB dressing at wheelchair level, mod cues    Status  Achieved      OT SHORT TERM GOAL #6   Title  Pt and wife will explore options for 3 in 1 commode for pt to use in bathroom or at bedside    Status  Achieved      OT SHORT TERM GOAL #7   Title  Pt will use RUE as stabilizer 25% of the time during basic self care skills with min a and cues.     Status  Achieved      OT SHORT  TERM GOAL #8   Title  Pt will be supervion for toilet transfers - 03/29/2018    Status  Achieved      OT SHORT TERM GOAL  #9   TITLE  Pt will tolerate functional activity at ambulatory level for 5 minutes with no rest breaks    Status  Achieved        OT Long Term Goals - 03/15/18 1039      OT LONG TERM GOAL #1   Title  Pt and wife will be mod I with home activity program designed to increase independence in basic self care as well as address cognition -04/26/2018 (renewed)    Status  On-going      OT LONG TERM GOAL #2   Title  Pt will be min a for UB bathing after set up with mod cues    Status  Achieved      OT LONG TERM GOAL #3   Title  Pt will be min a for UB dressing with mod cues    Status  Achieved      OT LONG TERM GOAL #4   Title  Pt will be min a for LB bathing with mod cues    Status  On-going      OT LONG TERM GOAL #5   Title  Pt will be min a for LB dressing with mod cues    Status  On-going      OT LONG TERM GOAL #6   Title  Pt will be min a for toilet transfers in bathroom (able to ambulate 3 steps into bathroom with wife).      Status  Achieved      OT LONG TERM GOAL #7   Title  Pt will be mod a for clothing mmgt with toileting    Status  Achieved      OT LONG TERM GOAL #8   Title  Pt will demonstrate ability to use RUE as stabilizer during basic self care 50% of the time with min a and cues.     Status  On-going            Plan -  03/28/18 1558    Clinical Impression Statement  Pt is progressing toward goals due to improved activity tolerance, more stable BP, and improved functional mobility    Occupational Profile and client history currently impacting functional performance  HLD, HTN, s/p craniotomy, IVC filter with Coumadin, seizures.    Occupational performance deficits (Please refer to evaluation for details):  ADL's;IADL's;Rest and Sleep;Work;Leisure;Social Participation    Rehab Potential  Good    Current Impairments/barriers affecting  progress:  severity of deficits    OT Frequency  2x / week    OT Duration  8 weeks    OT Treatment/Interventions  Self-care/ADL training;Moist Heat;Therapeutic exercise;Neuromuscular education;DME and/or AE instruction;Manual Therapy;Functional Mobility Training;Passive range of motion;Therapeutic activities;Splinting;Patient/family education;Balance training    Plan  NMR trunk/RUE, sit to stand, stand to sit, balance, functional mobility, possible use of Give Mohr sling for ambulation    Clinical Decision Making  Multiple treatment options, significant modification of task necessary    Consulted and Agree with Plan of Care  Patient    Family Member Consulted  [name]       Patient will benefit from skilled therapeutic intervention in order to improve the following deficits and impairments:  Abnormal gait, Decreased activity tolerance, Decreased balance, Decreased cognition, Decreased knowledge of use of DME, Decreased mobility, Decreased range of motion, Decreased safety awareness, Difficulty walking, Decreased strength, Impaired UE functional use, Impaired tone, Impaired sensation, Pain  Visit Diagnosis: Muscle weakness (generalized)  Unsteadiness on feet  Abnormal posture  Hemiplegia and hemiparesis following cerebral infarction affecting right dominant side (HCC)  Other symptoms and signs involving the nervous system  Other symptoms and signs involving cognitive functions following cerebral infarction  Other symptoms and signs involving the musculoskeletal system  Apraxia    Problem List There are no active problems to display for this patient.  Merleen Milliner M,OTR/L 03/28/2018, 4:08 PM  [facility] [address] [place], [place], [address] Phone: [phone]   Fax:  [phone]  Name: MIDAS DAUGHETY MRN: [id number] Date of Birth: 07-13-1955

## 2018-03-30 ENCOUNTER — Ambulatory Visit: Payer: BLUE CROSS/BLUE SHIELD | Admitting: Physical Therapy

## 2018-03-30 ENCOUNTER — Encounter: Payer: BLUE CROSS/BLUE SHIELD | Admitting: Occupational Therapy

## 2018-04-02 ENCOUNTER — Ambulatory Visit: Payer: BLUE CROSS/BLUE SHIELD | Admitting: Occupational Therapy

## 2018-04-02 ENCOUNTER — Ambulatory Visit: Payer: BLUE CROSS/BLUE SHIELD | Admitting: Physical Therapy

## 2018-04-02 ENCOUNTER — Encounter: Payer: Self-pay | Admitting: Physical Therapy

## 2018-04-02 VITALS — BP 140/90

## 2018-04-02 DIAGNOSIS — R2689 Other abnormalities of gait and mobility: Secondary | ICD-10-CM

## 2018-04-02 DIAGNOSIS — I69351 Hemiplegia and hemiparesis following cerebral infarction affecting right dominant side: Secondary | ICD-10-CM

## 2018-04-02 DIAGNOSIS — H8112 Benign paroxysmal vertigo, left ear: Secondary | ICD-10-CM

## 2018-04-02 DIAGNOSIS — R29818 Other symptoms and signs involving the nervous system: Secondary | ICD-10-CM

## 2018-04-02 DIAGNOSIS — M6281 Muscle weakness (generalized): Secondary | ICD-10-CM

## 2018-04-02 DIAGNOSIS — R482 Apraxia: Secondary | ICD-10-CM

## 2018-04-02 DIAGNOSIS — R2681 Unsteadiness on feet: Secondary | ICD-10-CM

## 2018-04-02 DIAGNOSIS — R42 Dizziness and giddiness: Secondary | ICD-10-CM

## 2018-04-02 DIAGNOSIS — I69318 Other symptoms and signs involving cognitive functions following cerebral infarction: Secondary | ICD-10-CM

## 2018-04-02 DIAGNOSIS — R293 Abnormal posture: Secondary | ICD-10-CM

## 2018-04-02 DIAGNOSIS — R29898 Other symptoms and signs involving the musculoskeletal system: Secondary | ICD-10-CM

## 2018-04-02 NOTE — Therapy (Addendum)
[facility] [address] [place], [place], [address] Phone: [phone]   Fax:  [phone]  Occupational Therapy Treatment  Patient Details  Name: Tim Stephens MRN: [id number] Date of Birth: 09-27-1955 Referring Provider: Jenita Seashore, MD   Encounter Date: 04/02/2018  OT End of Session - 04/02/18 1650    Visit Number  22    Number of Visits  33 (pt only has 30 visits per calendar year - renewal completed 03/01/2018)   Date for OT Re-Evaluation  04/26/18    Authorization Type  BCBS    Authorization Time Period  pt has been approved for 8 additonal visits for OT by 04/09/2018 (24 total)    Authorization - Visit Number  22    Authorization - Number of Visits  24    OT Start Time  1401    OT Stop Time  1445    OT Time Calculation (min)  44 min    Activity Tolerance  Patient tolerated treatment well       No past medical history on file.  No past surgical history on file.  There were no vitals filed for this visit.  Subjective Assessment - 04/02/18 1409    Subjective   Pt stumbled on stairs yesterday per wife - see PN for details.     Patient is accompained by:  Family member   wife   Pertinent History  MONITOR BP!!!  05/30/2017 admitted to [facility] with LMI cutoff, s/p thromectomy with resultant L CVA due mulitple thromboses;  IVC filter with Coumadin, 06/01/2017 craniotomy    Patient Stated Goals  unable to state due to aphasia    Currently in Pain?  No/denies                   OT Treatments/Exercises (OP) - 04/02/18 1643      ADLs   ADL Comments  Wife reports that pt stumbled on stairs heading up to take a shower but did not fall all the way down.  Pt able to complete ascent to upstairs and enter shower however once seated on shower seat pt's blood pressure dropped and pt was assisted onto the floor by his wife.  Once BP stablized, pt able to move from floor to chair with wife's assistance.  Pt and  wife both report no injuries and pt did take shower later that day.        Neurological Re-education Exercises   Other Exercises 1  Neuro re ed in sitting, sidesitting to address trunk control, RUE proximal stability (body on arm, body moving over arm in side sitting and long arm), body and arm togther at mid reach range in closed chain using resistance and forced paradigm to increase activation.  Also addressed low to mid reach reach patterns using UE ranger - pt with improved abilty today to control RUE for this activity. Incorporated grading into all activiites as pt tends to hold breath and over activates relative to task.                 OT Short Term Goals - 04/02/18 1648      OT SHORT TERM GOAL #1   Title  Pt and wife will be mod I with HEP for RUE ROM - 01/30/2018 (renewed)    Status  Achieved      OT SHORT TERM GOAL #2   Title  Pt will be min a for grooming with mod cues from wife and intermittent  set up prn    Status  Achieved      OT SHORT TERM GOAL #3   Title  Pt will be mod a for UB bathing at wheelchair level with mod cues    Status  Achieved      OT SHORT TERM GOAL #4   Title  Pt will be mod a for LB bathing at sit to stand level  with mod cues and set up    Status  Achieved      OT SHORT TERM GOAL #5   Title  Pt will be max a for UB dressing at wheelchair level, mod cues    Status  Achieved      OT SHORT TERM GOAL #6   Title  Pt and wife will explore options for 3 in 1 commode for pt to use in bathroom or at bedside    Status  Achieved      OT SHORT TERM GOAL #7   Title  Pt will use RUE as stabilizer 25% of the time during basic self care skills with min a and cues.     Status  Achieved      OT SHORT TERM GOAL #8   Title  Pt will be supervion for toilet transfers - 03/29/2018    Status  Achieved      OT SHORT TERM GOAL  #9   TITLE  Pt will tolerate functional activity at ambulatory level for 5 minutes with no rest breaks    Status  Achieved         OT Long Term Goals - 04/02/18 1649      OT LONG TERM GOAL #1   Title  Pt and wife will be mod I with home activity program designed to increase independence in basic self care as well as address cognition -04/26/2018 (renewed)    Status  On-going      OT LONG TERM GOAL #2   Title  Pt will be min a for UB bathing after set up with mod cues    Status  Achieved      OT LONG TERM GOAL #3   Title  Pt will be min a for UB dressing with mod cues    Status  Achieved      OT LONG TERM GOAL #4   Title  Pt will be min a for LB bathing with mod cues    Status  On-going      OT LONG TERM GOAL #5   Title  Pt will be min a for LB dressing with mod cues    Status  On-going      OT LONG TERM GOAL #6   Title  Pt will be min a for toilet transfers in bathroom (able to ambulate 3 steps into bathroom with wife).      Status  Achieved      OT LONG TERM GOAL #7   Title  Pt will be mod a for clothing mmgt with toileting    Status  Achieved      OT LONG TERM GOAL #8   Title  Pt will demonstrate ability to use RUE as stabilizer during basic self care 50% of the time with min a and cues.     Status  On-going            Plan - 04/02/18 1649    Clinical Impression Statement  Pt continues to make slow but steady progress toward goals. Pt with slowly improving  proximal control and movement of LUE in forced use activities.      Occupational Profile and client history currently impacting functional performance  HLD, HTN, s/p craniotomy, IVC filter with Coumadin, seizures.    Occupational performance deficits (Please refer to evaluation for details):  ADL's;IADL's;Rest and Sleep;Work;Leisure;Social Participation    Rehab Potential  Good    Current Impairments/barriers affecting progress:  severity of deficits    OT Frequency  2x / week    OT Duration  8 weeks    OT Treatment/Interventions  Self-care/ADL training;Moist Heat;Therapeutic exercise;Neuromuscular education;DME and/or AE  instruction;Manual Therapy;Functional Mobility Training;Passive range of motion;Therapeutic activities;Splinting;Patient/family education;Balance training    Plan  NMR trunk/RUE, sit to stand, stand to sit, balance, functional mobility, possible use of Give Mohr sling for ambulation    Consulted and Agree with Plan of Care  Patient    Family Member Consulted  [name]       Patient will benefit from skilled therapeutic intervention in order to improve the following deficits and impairments:  Abnormal gait, Decreased activity tolerance, Decreased balance, Decreased cognition, Decreased knowledge of use of DME, Decreased mobility, Decreased range of motion, Decreased safety awareness, Difficulty walking, Decreased strength, Impaired UE functional use, Impaired tone, Impaired sensation, Pain  Visit Diagnosis: Unsteadiness on feet  Abnormal posture  Muscle weakness (generalized)  Hemiplegia and hemiparesis following cerebral infarction affecting right dominant side (HCC)  Other symptoms and signs involving the nervous system  Other symptoms and signs involving cognitive functions following cerebral infarction  Other symptoms and signs involving the musculoskeletal system  Apraxia    Problem List There are no active problems to display for this patient.   Norton Pastelulaski, Lillybeth Tal Halliday, OTR/L 04/02/2018, 4:51 PM  [facility] [address] [place], [place], [address] Phone: [phone]   Fax:  [phone]  Name: Pennelope BrackenJames M Oelkers MRN: [id number] Date of Birth: 03/31/1956

## 2018-04-02 NOTE — Therapy (Signed)
[facility] [address] [place], [place], [address] Phone: [phone]   Fax:  [phone]  Physical Therapy Treatment  Patient Details  Name: Tim Stephens MRN: [id number] Date of Birth: Dec 31, 1955 Referring Provider: Scarlette Ar, MD   Encounter Date: 04/02/2018  PT End of Session - 04/02/18 1330    Visit Number  26    Number of Visits  32    Date for PT Re-Evaluation  04/22/18    Authorization Type  BCBS- must request more visits and date extension     Authorization Time Period  additional 8 visits approved 03/01/18-/05/01/18    Authorization - Visit Number  26    Authorization - Number of Visits  32    PT Start Time  9937    PT Stop Time  1400    PT Time Calculation (min)  45 min    Activity Tolerance  Patient tolerated treatment well    Behavior During Therapy  [facility] for tasks assessed/performed       History reviewed. No pertinent past medical history.  History reviewed. No pertinent surgical history.  Vitals:   04/02/18 1323 04/02/18 1359  BP: 140/90 140/90    Subjective Assessment - 04/02/18 1323    Subjective  Pt reports every time he lays down on his L side he gets dizzy which may be 2/2 BPPV. Pt reports his BP has been high recently. [name] wife reports he missed a step when ascending stairs while attempting to perform with reciprocal stepping technique. [name] wife reports this accident occurred yesterday and he landed on his R knee with patient indicating soreness with bruising. Wife also reports his BP dropped while she was assisting him in the shower yesterday.      Patient is accompained by:  Family member   wife   Pertinent History  Skull flap surgery 11/02/17.  PMH significant for hypertension and hyperlipidemia who was admitted to [facility] on 05/30/2017 with a aphasia and right hemibody weakness. CTA was obtained and revealed left M1 cutoff; cerebral angiography with mechanical thrombectomy. The  patient's acute hospital course was complicated by worsening cytotoxic edema as well areas of hemorrhagic conversion with left to right midline shift. He was taken to the OR on 06/01/2017 for decompressive left hemicraniectomy. [name] He was found to have a right peroneal, right posterior tibial, right gastrocnemius,left basilic, and left axillary venous thrombi. He then developed totally occluding superficial thrombophlebitis of the left cephalic vein and right cephalic vein. Venous thrombus then progressed in the left upper arm to the left brachial vein. The patient was not anticoagulated due to recent hemorrhagic conversion of stroke. PEG was placed by Dr. [name] on 06/15/2017. There was progression of right lower extremity DVT from the posterior tibial vein to the right common femoral vein. Vascular surgery was consulted and placed an inferior vena cava filter on 06/18/2017. expressive aphasia, verbal apraxia; therapy at [facility] January 2019, then [facility] rehab for 4 weeks, then [facility] therapy PT, OT, speech discharged 09/21/17    Patient Stated Goals  Per wife:  to get strong enough to get upstairs, and use the bathroom on his own; trying to be more mobile outside the home-fishing, more recreational things    Currently in Pain?  Yes   Patient unable to feel pain in R knee 2/2 impaired sensation; however, he fell on his R knee yesterday when attempting to ascend stairs reciprocally   Pain Score  0-No pain   denies  pain; however, may be unable to distinguish 2/2 impaired sensation   Pain Location  Knee    Pain Orientation  Right    Pain Onset  Yesterday             Vestibular Assessment - 04/02/18 1641      Positional Testing   Dix-Hallpike  Dix-Hallpike Left      Dix-Hallpike Left   Dix-Hallpike Left Duration  10 seconds    Dix-Hallpike Left Symptoms  Upbeat, left rotatory nystagmus               [facility] Adult PT Treatment/Exercise - 04/02/18 1336      Transfers    Transfers  Sit to Stand;Stand to [employer] Transfers    Sit to Stand  5: Supervision    Stand to Sit  5: Supervision    Stand Pivot Transfers  4: Min guard      Therapeutic Activites    Therapeutic Activities  Other Therapeutic Activities    Other Therapeutic Activities  Advised pt if he sleeps on his L side to raise the [facility] to about 20 deg of flexion to prevent reoccurence of L posterior canal BPPV.  Also discussed ways to elevate HOB once pt returns to regular bed upstairs      Vestibular Treatment/Exercise - 04/02/18 1336      Vestibular Treatment/Exercise   Vestibular Treatment Provided  Canalith Repositioning    Canalith Repositioning  Epley Manuever Left   (+) upbeat left rotary      EPLEY MANUEVER LEFT   Number of Reps   2    Overall Response   Improved Symptoms     RESPONSE DETAILS LEFT  Pt reports dizziness s/p first Epley maneuver with no reports of nausea. Pt participated in 2nd [place] with decreased overall dizziness with improvement in sx            PT Education - 04/02/18 1328    Education provided  Yes    Education Details  Therapist provides education regarding sleeping with the head of the bed elevated before sleeping on his L side to reduce vertigo symptoms.    Person(s) Educated  Patient;Spouse    Methods  Explanation    Comprehension  Verbalized understanding       PT Short Term Goals - 03/28/18 2121      PT SHORT TERM GOAL #1   Title  Pt will perform updated HEP with wife's assistance for improved strength, balance, and gait.  TARGET 03/23/18    Time  4   per recert 2/69/48   Period  Weeks    Status  Achieved      PT SHORT TERM GOAL #2   Title  Pt will perform sit<>stand modified independently for improved transfer efficiency and safety.    Baseline  mod I sit to stand; supervision for stand pivot with cane and AFO    Time  4    Period  Weeks    Status  Partially Met      PT SHORT TERM GOAL #3   Title  Pt will ambulate at least  200 ft using SBQC and AFO with supervision for improved gait safety and independence.    Baseline  230' with quad based cane, AFO, supervision making left and right turns with intermittent min A when R foot catches    Time  4    Period  Weeks    Status  Partially Met      PT  SHORT TERM GOAL #4   Title  Pt will negotiate one step into downstairs bathroom at home, using [facility] with minimal assistance, for improved ADL participation and independence at home.    Baseline  mod assist with [facility]; is using hemiwalker at home    Time  4    Period  Weeks    Status  Achieved        PT Long Term Goals - 03/28/18 2117      PT LONG TERM GOAL #1   Title  Pt/wife will verbalize plans for continued community fitness upon d/c from PT.      Baseline  Will continue to address as pt nears d/c    Time  8   per recert 08/14/38/10   Period  Weeks    Status  Revised    Target Date  04/22/18      PT LONG TERM GOAL #2   Title  Pt will improve Berg Balance test to at least 35/56 for decreased fall risk.    Baseline  27/56 02/20/18    Time  8    Period  Weeks    Status  Revised    Target Date  04/22/18      PT LONG TERM GOAL #3   Title  Pt will ambulate at least 300 ft using least restrictive assistive device (SBQC versus rubber tip quad cane), modified independently, for improved safety and independence with gait.    Baseline   115 ft 02/22/18 with min assist with [facility]    Time  8    Period  Weeks    Status  Revised    Target Date  04/22/18      PT LONG TERM GOAL #4   Title  Pt will improve gait velocity to >/= 1.3 ft/sec with use of appropriate AFO and LRAD for improved gait efficiency and safety.    Baseline   0.73 ft/sec 02/20/18    Time  8    Period  Weeks    Status  Revised    Target Date  04/22/18      PT LONG TERM GOAL #5   Title  Pt will negotiate 12 steps using handrail, with supervision, for improved safety and independence with stair negotiation for home.    Baseline  4 steps min assist  02/20/18    Time  8    Period  Weeks    Status  Revised    Target Date  04/22/18      PT LONG TERM GOAL #6   Title  Pt and wife will verbalize/demonstrate maneuvers to decrease residual episodes of BPPV.    Status  Revised    Target Date  04/22/18      PT LONG TERM GOAL #7   Title       Baseline               Plan - 04/02/18 1338    Clinical Impression Statement  Today's session primarily focused on treating patient's L BPPV using the canalith repositioning maneuver with patient demonstrating obervable left upbeat rotary nystagmus with symptoms of dizzines during initial attempt. Therapist initiated second attempt of canalith repositioning maneuver with patient reporting reduction in overall symptoms with observabe less nystagmus. Therapist reports she will assess for R sided BPPV next session. Blood pressure was assessed manually pre and post canalith repositioning maneuver and noted to be within normal limits. Pt will continue to benefit from skilled physical therapy to address strength, balance,  and functional mobility impairments to improve independence and progress towards achieving LTG's.     Rehab Potential  Good    Clinical Impairments Affecting Rehab Potential  Good family support; severity of deficits    PT Frequency  2x / week    PT Duration  8 weeks   per recert 1/68/37   PT Treatment/Interventions  ADLs/Self Care Home Management;DME Instruction;Gait training;Stair training;Functional mobility training;Therapeutic activities;Therapeutic exercise;Balance training;Orthotic Fit/Training;Patient/family education;Neuromuscular re-education;Canalith Repostioning;Vestibular;Electrical Stimulation    PT Next Visit Plan  CHECK BP AND MONITOR FOR ORTHOSTASIS/OR HIGH BP AFTER ACTIVITY. Assess R sided BPPV, gait with cane from home, step up into truck,  Stair negotiation alternating sequence, household and outdoor over pavement ambulation training with cane (vary surfaces, obstacles,  dual task).  (need to follow up with Dr. Pricilla Holm about cane order)   Check with pt/wife on how they are doing with OT's activity list for home   PT Home Exercise Plan  [id number] (medBridge)    Consulted and Agree with Plan of Care  Patient;Family member/caregiver    Family Member Consulted  wife       Patient will benefit from skilled therapeutic intervention in order to improve the following deficits and impairments:  Abnormal gait, Decreased activity tolerance, Decreased balance, Decreased knowledge of precautions, Decreased endurance, Decreased knowledge of use of DME, Decreased mobility, Decreased safety awareness, Difficulty walking, Decreased strength, Impaired tone, Postural dysfunction, Dizziness  Visit Diagnosis: Dizziness and giddiness  BPPV (benign paroxysmal positional vertigo), left  Unsteadiness on feet  Other abnormalities of gait and mobility  Abnormal posture     Problem List There are no active problems to display for this patient.   Rico Junker, PT, DPT 04/02/18    4:46 PM    [facility] [address] [place], [place], [address] Phone: [phone]   Fax:  [phone]  Name: ERRIK MITCHELLE MRN: [id number] Date of Birth: 1955-11-28

## 2018-04-05 ENCOUNTER — Ambulatory Visit: Payer: BLUE CROSS/BLUE SHIELD | Admitting: Physical Therapy

## 2018-04-05 ENCOUNTER — Encounter: Payer: Self-pay | Admitting: Occupational Therapy

## 2018-04-05 ENCOUNTER — Encounter: Payer: Self-pay | Admitting: Physical Therapy

## 2018-04-05 ENCOUNTER — Ambulatory Visit: Payer: BLUE CROSS/BLUE SHIELD | Admitting: Occupational Therapy

## 2018-04-05 VITALS — BP 130/90

## 2018-04-05 DIAGNOSIS — R29818 Other symptoms and signs involving the nervous system: Secondary | ICD-10-CM

## 2018-04-05 DIAGNOSIS — R29898 Other symptoms and signs involving the musculoskeletal system: Secondary | ICD-10-CM

## 2018-04-05 DIAGNOSIS — M6281 Muscle weakness (generalized): Secondary | ICD-10-CM

## 2018-04-05 DIAGNOSIS — R293 Abnormal posture: Secondary | ICD-10-CM

## 2018-04-05 DIAGNOSIS — I69351 Hemiplegia and hemiparesis following cerebral infarction affecting right dominant side: Secondary | ICD-10-CM

## 2018-04-05 DIAGNOSIS — R2681 Unsteadiness on feet: Secondary | ICD-10-CM

## 2018-04-05 DIAGNOSIS — I69318 Other symptoms and signs involving cognitive functions following cerebral infarction: Secondary | ICD-10-CM

## 2018-04-05 DIAGNOSIS — R2689 Other abnormalities of gait and mobility: Secondary | ICD-10-CM

## 2018-04-05 DIAGNOSIS — R482 Apraxia: Secondary | ICD-10-CM

## 2018-04-05 NOTE — Therapy (Signed)
[facility] [address] [place], [place], [address] Phone: [phone]   Fax:  [phone]  Occupational Therapy Treatment  Patient Details  Name: Tim Stephens MRN: [id number] Date of Birth: 1956-07-05 Referring Provider: Jenita Seashore, MD   Encounter Date: 04/05/2018  OT End of Session - 04/05/18 1201    Visit Number  23    Number of Visits  33   pt only has 30 visits per calendar year   Date for OT Re-Evaluation  04/26/18    Authorization Type  BCBS    Authorization Time Period  pt has been approved for 8 additonal visits for OT by 04/09/2018 (24 total)    Authorization - Visit Number  23    Authorization - Number of Visits  24    OT Start Time  1103    OT Stop Time  1148    OT Time Calculation (min)  45 min    Activity Tolerance  Patient tolerated treatment well       History reviewed. No pertinent past medical history.  History reviewed. No pertinent surgical history.  There were no vitals filed for this visit.  Subjective Assessment - 04/05/18 1155    Subjective   Yes (pointing to walker and nodding head yes)    Patient is accompained by:  Family member   wife   Pertinent History  MONITOR BP!!!  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  No/denies                   OT Treatments/Exercises (OP) - 04/05/18 0001      Neurological Re-education Exercises   Other Exercises 1  After discussion with PT, had pt trial RW with hand orthosis  (vs cane) to address improved postural alignment and control, opportunity to activate RUE in low reach pattern as well as weight bearing and increase muscle activation for proximal shoulder control, R side of trunk and increased weight bearing on RLE during swing phase of  LLE.  Pt demonstrating much improved alignment and control as well as able to control RW using BUE's.  Pt able to amubulate with RW 119ft x2 with one rest break.  Pt nodding yes to use of walker vs cane and wife also in agreement. PT in agreement as well and will follow up with MD.                OT Short Term Goals - 04/05/18 1159      OT SHORT TERM GOAL #1   Title  Pt and wife will be mod I with HEP for RUE ROM - 01/30/2018 (renewed)    Status  Achieved      OT SHORT TERM GOAL #2   Title  Pt will be min a for grooming with mod cues from wife and intermittent set up prn    Status  Achieved      OT SHORT TERM GOAL #3   Title  Pt will be mod a for UB bathing at wheelchair level with mod cues    Status  Achieved      OT SHORT TERM GOAL #4   Title  Pt will be mod a for LB bathing at sit to stand level  with mod cues and set  up    Status  Achieved      OT SHORT TERM GOAL #5   Title  Pt will be max a for UB dressing at wheelchair level, mod cues    Status  Achieved      OT SHORT TERM GOAL #6   Title  Pt and wife will explore options for 3 in 1 commode for pt to use in bathroom or at bedside    Status  Achieved      OT SHORT TERM GOAL #7   Title  Pt will use RUE as stabilizer 25% of the time during basic self care skills with min a and cues.     Status  Achieved      OT SHORT TERM GOAL #8   Title  Pt will be supervion for toilet transfers - 03/29/2018    Status  Achieved      OT SHORT TERM GOAL  #9   TITLE  Pt will tolerate functional activity at ambulatory level for 5 minutes with no rest breaks    Status  Achieved        OT Long Term Goals - 04/05/18 1159      OT LONG TERM GOAL #1   Title  Pt and wife will be mod I with home activity program designed to increase independence in basic self care as well as address cognition -04/26/2018 (renewed)    Status  On-going      OT LONG TERM GOAL #2   Title  Pt will be min a for UB bathing after set up with mod cues     Status  Achieved      OT LONG TERM GOAL #3   Title  Pt will be min a for UB dressing with mod cues    Status  Achieved      OT LONG TERM GOAL #4   Title  Pt will be min a for LB bathing with mod cues    Status  On-going      OT LONG TERM GOAL #5   Title  Pt will be min a for LB dressing with mod cues    Status  On-going      OT LONG TERM GOAL #6   Title  Pt will be min a for toilet transfers in bathroom (able to ambulate 3 steps into bathroom with wife).      Status  Achieved      OT LONG TERM GOAL #7   Title  Pt will be mod a for clothing mmgt with toileting    Status  Achieved      OT LONG TERM GOAL #8   Title  Pt will demonstrate ability to use RUE as stabilizer during basic self care 50% of the time with min a and cues.     Status  On-going            Plan - 04/05/18 1200    Clinical Impression Statement  Pt progressing toward goals. Pt continues to improve in functional mobility as well as beginning to be able to use RUE within context of using RW (with hand orthosis)    Occupational Profile and client history currently impacting functional performance  HLD, HTN, s/p craniotomy, IVC filter with Coumadin, seizures.    Occupational performance deficits (Please refer to evaluation for details):  ADL's;IADL's;Rest and Sleep;Work;Leisure;Social Participation    Rehab Potential  Good    Current Impairments/barriers affecting progress:  severity of deficits    OT  Treatment/Interventions  Self-care/ADL training;Moist Heat;Therapeutic exercise;Neuromuscular education;DME and/or AE instruction;Manual Therapy;Functional Mobility Training;Passive range of motion;Therapeutic activities;Splinting;Patient/family education;Balance training    Plan  NMR trunk/RUE, sit to stand, stand to sit, balance, functional mobility, possible use of Give Mohr sling for ambulation    Consulted and Agree with Plan of Care  Patient;Family member/caregiver    Family Member Consulted  [name]        Patient will benefit from skilled therapeutic intervention in order to improve the following deficits and impairments:  Abnormal gait, Decreased activity tolerance, Decreased balance, Decreased cognition, Decreased knowledge of use of DME, Decreased mobility, Decreased range of motion, Decreased safety awareness, Difficulty walking, Decreased strength, Impaired UE functional use, Impaired tone, Impaired sensation, Pain  Visit Diagnosis: Unsteadiness on feet  Abnormal posture  Muscle weakness (generalized)  Hemiplegia and hemiparesis following cerebral infarction affecting right dominant side (HCC)  Other symptoms and signs involving the nervous system  Other symptoms and signs involving cognitive functions following cerebral infarction  Other symptoms and signs involving the musculoskeletal system  Apraxia    Problem List There are no active problems to display for this patient.   Norton Pastel, OTR/L 04/05/2018, 12:02 PM  [facility] [address] [place], [place], [address] Phone: [phone]   Fax:  [phone]  Name: OBINNA EHRESMAN MRN: [id number] Date of Birth: [birth year]

## 2018-04-05 NOTE — Therapy (Addendum)
[facility] [address] [place], [place], [address] Phone: [phone]   Fax:  [phone]  Physical Therapy Treatment  Patient Details  Name: Tim Stephens MRN: [id number] Date of Birth: 1956/03/02 Referring Provider (PT): Scarlette Ar, MD   Encounter Date: 04/05/2018  PT End of Session - 04/05/18 1244    Visit Number  27    Number of Visits  32   VL: 30 - need to decrease frequency for the next 3 weeks to 1x/week   Date for PT Re-Evaluation  04/22/18    Authorization Type  BC[employer]- must request more visits and date extension.  VL: 30 per discipline    Authorization Time Period  additional 8 visits approved 03/01/18-/05/01/18    Authorization - Visit Number  28    Authorization - Number of Visits  30   30 VL per year for each discipline   PT Start Time  4287    PT Stop Time  1100    PT Time Calculation (min)  45 min    Activity Tolerance  Patient tolerated treatment well    Behavior During Therapy  [facility] for tasks assessed/performed       History reviewed. No pertinent past medical history.  History reviewed. No pertinent surgical history.  Vitals:   04/05/18 1032  BP: 130/90    Subjective Assessment - 04/05/18 1030    Subjective  Dizziness is better, did not sleep on L side but made it hard for him to sleep.  Brought in cane today to practice with.  Has a UTI but is on antibiotics.      Patient is accompained by:  Family member   wife   Pertinent History  Skull flap surgery 11/02/17.  PMH significant for hypertension and hyperlipidemia who was admitted to [facility] on 05/30/2017 with a aphasia and right hemibody weakness. CTA was obtained and revealed left M1 cutoff; cerebral angiography with mechanical thrombectomy. The patient's acute hospital course was complicated by worsening cytotoxic edema as well areas of hemorrhagic conversion with left to right midline shift. He was taken to the OR on 06/01/2017 for  decompressive left hemicraniectomy. [name] He was found to have a right peroneal, right posterior tibial, right gastrocnemius,left basilic, and left axillary venous thrombi. He then developed totally occluding superficial thrombophlebitis of the left cephalic vein and right cephalic vein. Venous thrombus then progressed in the left upper arm to the left brachial vein. The patient was not anticoagulated due to recent hemorrhagic conversion of stroke. PEG was placed by Dr. [name] on 06/15/2017. There was progression of right lower extremity DVT from the posterior tibial vein to the right common femoral vein. Vascular surgery was consulted and placed an inferior vena cava filter on 06/18/2017. expressive aphasia, verbal apraxia; therapy at [facility] January 2019, then [facility] rehab for 4 weeks, then [facility] therapy PT, OT, speech discharged 09/21/17    Patient Stated Goals  Per wife:  to get strong enough to get upstairs, and use the bathroom on his own; trying to be more mobile outside the home-fishing, more recreational things    Currently in Pain?  No/denies    Pain Onset  [name] Adult PT Treatment/Exercise - 04/05/18 1032      Ambulation/Gait   Ambulation/Gait  Yes    Ambulation/Gait Assistance  4: Min guard  Ambulation/Gait Assistance Details  AFO donned, with quad tip cane patient and wife bought.  Performed ambulation from waiting area to gym and around treatment gym. Continues to require tactile cues and facilitation of L shoulder retraction and external rotation for more upright trunk to improve balance and RLE swing activation and clearance    Ambulation Distance (Feet)  200 Feet    Assistive device  Straight cane    Gait Pattern  Step-through pattern;Decreased stride length;Decreased dorsiflexion - right;Right genu recurvatum;Trunk flexed;Poor foot clearance - right    Ambulation Surface  Level;Indoor    Stairs  Yes    Stairs Assistance   4: Min assist    Stairs Assistance Details (indicate cue type and reason)  continued to review alternating sequence with pt demonstrating 3-4 episodes of catching his R foot on step when ascending and decreased R knee control when descending    Stair Management Technique  One rail Left;Alternating pattern;Forwards    Number of Stairs  8    Height of Stairs  6    Pre-Gait Activities  Performed in // bars beginning with LUE support progressing to bilat UE support for increased WB through RUE and postural control while having pt perform 10 reps: standing with LLE across black beam performing RLE step overs to floor and then to tap upright block to facilitate increased RLE flexion during swing.  With RUE supported and activation pt demonstrated significantly improved RLE activation and clearance.  Transitioned to standing on beam with RLE and stepping over to tap 15" box with LLE  x 10 reps with bilat UE support to begin to simulate stance on RLE with LLE stepping up to high running board on patient's truck.  Finished with forwards and retro gait in // bars stepping over beams turned upright to facilitate increased step length and LE activation for clearance.  Pt able to lead with LLE and then clear RLE but unable to lead with RLE and clear beam      Therapeutic Activites    Therapeutic Activities  Other Therapeutic Activities    Other Therapeutic Activities  Continued to discuss with pt and OT recommendations for w/c when downsizing from reclining w/c (manual w/c or transport chair) and pros/cons of each and recommendations for most appropriate AD now that pt is demonstrating active movement in RUE.  Will continue to assess               PT Short Term Goals - 03/28/18 2121      PT SHORT TERM GOAL #1   Title  Pt will perform updated HEP with wife's assistance for improved strength, balance, and gait.  TARGET 03/23/18    Time  4   per recert 4/97/02   Period  Weeks    Status  Achieved      PT  SHORT TERM GOAL #2   Title  Pt will perform sit<>stand modified independently for improved transfer efficiency and safety.    Baseline  mod I sit to stand; supervision for stand pivot with cane and AFO    Time  4    Period  Weeks    Status  Partially Met      PT SHORT TERM GOAL #3   Title  Pt will ambulate at least 200 ft using SBQC and AFO with supervision for improved gait safety and independence.    Baseline  230' with quad based cane, AFO, supervision making left and right turns with intermittent min A when R foot  catches    Time  4    Period  Weeks    Status  Partially Met      PT SHORT TERM GOAL #4   Title  Pt will negotiate one step into downstairs bathroom at home, using [facility] with minimal assistance, for improved ADL participation and independence at home.    Baseline  mod assist with [facility]; is using hemiwalker at home    Time  4    Period  Weeks    Status  Achieved        PT Long Term Goals - 03/28/18 2117      PT LONG TERM GOAL #1   Title  Pt/wife will verbalize plans for continued community fitness upon d/c from PT.      Baseline  Will continue to address as pt nears d/c    Time  8   per recert 03/13/22/55   Period  Weeks    Status  Revised    Target Date  04/22/18      PT LONG TERM GOAL #2   Title  Pt will improve Berg Balance test to at least 35/56 for decreased fall risk.    Baseline  27/56 02/20/18    Time  8    Period  Weeks    Status  Revised    Target Date  04/22/18      PT LONG TERM GOAL #3   Title  Pt will ambulate at least 300 ft using least restrictive assistive device (SBQC versus rubber tip quad cane), modified independently, for improved safety and independence with gait.    Baseline   115 ft 02/22/18 with min assist with [facility]    Time  8    Period  Weeks    Status  Revised    Target Date  04/22/18      PT LONG TERM GOAL #4   Title  Pt will improve gait velocity to >/= 1.3 ft/sec with use of appropriate AFO and LRAD for improved gait efficiency  and safety.    Baseline   0.73 ft/sec 02/20/18    Time  8    Period  Weeks    Status  Revised    Target Date  04/22/18      PT LONG TERM GOAL #5   Title  Pt will negotiate 12 steps using handrail, with supervision, for improved safety and independence with stair negotiation for home.    Baseline  4 steps min assist 02/20/18    Time  8    Period  Weeks    Status  Revised    Target Date  04/22/18      PT LONG TERM GOAL #6   Title  Pt and wife will verbalize/demonstrate maneuvers to decrease residual episodes of BPPV.    Status  Revised    Target Date  04/22/18      PT LONG TERM GOAL #7   Title       Baseline               Plan - 04/05/18 1247    Clinical Impression Statement  Dizziness resolved so treatment focus today returned to pre-gait, gait and stair negotiation training initially with one UE support but transitioning to bilat UE support due to pt demonstrating improved ability to activate RUE resulting in improved postural control, balance and RLE activation.  Pt tolerated well with no significant increase or decrease in BP but pt does continue to fatigue after performing gait  and standing balance activities.  Will continue to progress and discuss most appropriate options for w/c and ambulation AD.    Rehab Potential  Good    Clinical Impairments Affecting Rehab Potential  Good family support; severity of deficits    PT Frequency  2x / week    PT Duration  8 weeks   per recert 9/62/95   PT Treatment/Interventions  ADLs/Self Care Home Management;DME Instruction;Gait training;Stair training;Functional mobility training;Therapeutic activities;Therapeutic exercise;Balance training;Orthotic Fit/Training;Patient/family education;Neuromuscular re-education;Canalith Repostioning;Vestibular;Electrical Stimulation    PT Next Visit Plan  CHECK BP AND MONITOR FOR ORTHOSTASIS/OR HIGH BP AFTER ACTIVITY. Practice 17" step up into truck,  Stair negotiation alternating sequence, practice  using RW now that pt has increased activation in RUE - it improves activation in RLE when he is activating RUE shoulder/scapula for more upright trunk   Check with pt/wife on how they are doing with OT's activity list for home   PT Home Exercise Plan  [id number] (medBridge)    Consulted and Agree with Plan of Care  Patient;Family member/caregiver    Family Member Consulted  wife       Patient will benefit from skilled therapeutic intervention in order to improve the following deficits and impairments:  Abnormal gait, Decreased activity tolerance, Decreased balance, Decreased knowledge of precautions, Decreased endurance, Decreased knowledge of use of DME, Decreased mobility, Decreased safety awareness, Difficulty walking, Decreased strength, Impaired tone, Postural dysfunction, Dizziness  Visit Diagnosis: Muscle weakness (generalized)  Abnormal posture  Unsteadiness on feet  Other abnormalities of gait and mobility     Problem List There are no active problems to display for this patient.   Tim Stephens, PT, DPT 04/05/18    12:56 PM    [facility] [address] [place], [place], [address] Phone: [phone]   Fax:  [phone]  Name: Tim Stephens MRN: [id number] Date of Birth: 01/06/1956

## 2018-04-09 ENCOUNTER — Encounter: Payer: Self-pay | Admitting: Physical Therapy

## 2018-04-09 ENCOUNTER — Telehealth: Payer: Self-pay | Admitting: Physical Therapy

## 2018-04-09 ENCOUNTER — Encounter: Payer: Self-pay | Admitting: Occupational Therapy

## 2018-04-09 ENCOUNTER — Ambulatory Visit: Payer: BLUE CROSS/BLUE SHIELD | Admitting: Physical Therapy

## 2018-04-09 ENCOUNTER — Ambulatory Visit: Payer: BLUE CROSS/BLUE SHIELD | Admitting: Occupational Therapy

## 2018-04-09 VITALS — BP 141/106 | HR 94

## 2018-04-09 DIAGNOSIS — I69351 Hemiplegia and hemiparesis following cerebral infarction affecting right dominant side: Secondary | ICD-10-CM

## 2018-04-09 DIAGNOSIS — R2681 Unsteadiness on feet: Secondary | ICD-10-CM

## 2018-04-09 DIAGNOSIS — R2689 Other abnormalities of gait and mobility: Secondary | ICD-10-CM

## 2018-04-09 DIAGNOSIS — R293 Abnormal posture: Secondary | ICD-10-CM

## 2018-04-09 DIAGNOSIS — I69318 Other symptoms and signs involving cognitive functions following cerebral infarction: Secondary | ICD-10-CM

## 2018-04-09 DIAGNOSIS — M6281 Muscle weakness (generalized): Secondary | ICD-10-CM

## 2018-04-09 DIAGNOSIS — R482 Apraxia: Secondary | ICD-10-CM

## 2018-04-09 DIAGNOSIS — R29818 Other symptoms and signs involving the nervous system: Secondary | ICD-10-CM

## 2018-04-09 DIAGNOSIS — R29898 Other symptoms and signs involving the musculoskeletal system: Secondary | ICD-10-CM

## 2018-04-09 NOTE — Patient Instructions (Signed)
Home Program:  Do these every day at least 3 times per day:  1. Jess to sit in front of you and your right hand on her shoulder. Push her away then Jess will resist and you will count 1-10 or sing part of Happy Birthday. Once [name] starts to push with his right arm, try not to touch his arm. Do between 5-7 , 3 times per day.  2. Use the cane. Have [name] push the cane straight out then pull it back by using his arm (not by leaning his body back and forth - you may need to cue him at first). Do 10 straight in front then 10 slightly off to his right. Do 3 times per day.  3. Use cone to practice turning the hand on - Place cone in [name]'s right hand then try and pull it out.  Do 5 -10 reps several times per day. Jess will need to support your wrist.  Make sure your breath!!  Cue him about effort so he learns to grade how much force he uses.

## 2018-04-09 NOTE — Therapy (Signed)
[facility] [address] [place], [place], [address] Phone: [phone]   Fax:  [phone]  Physical Therapy Treatment  Patient Details  Name: Tim Stephens MRN: [id number] Date of Birth: 1956-01-22 Referring Provider (PT): Tim Ar, MD   Encounter Date: 04/09/2018  PT End of Session - 04/09/18 1427    Visit Number  28    Number of Visits  30   VL: 30    Date for PT Re-Evaluation  04/22/18    Authorization Type  [employer]- must request more visits and date extension.  VL: 30 per discipline    Authorization Time Period  additional 8 visits approved 03/01/18-/05/01/18    Authorization - Visit Number  28    Authorization - Number of Visits  30   30 VL per year for each discipline   PT Start Time  9675    PT Stop Time  1400    PT Time Calculation (min)  45 min    Activity Tolerance  Patient tolerated treatment well    Behavior During Therapy  [facility] for tasks assessed/performed       History reviewed. No pertinent past medical history.  History reviewed. No pertinent surgical history.  Vitals:   04/09/18 1402  BP: (!) 141/106  Pulse: 94    Subjective Assessment - 04/09/18 1416    Subjective  No dizziness; no issues over the weekend.  Open to ambulating with RW to treatment area today.    Patient is accompained by:  Family member   wife   Pertinent History  Skull flap surgery 11/02/17.  PMH significant for hypertension and hyperlipidemia who was admitted to [facility] on 05/30/2017 with a aphasia and right hemibody weakness. CTA was obtained and revealed left M1 cutoff; cerebral angiography with mechanical thrombectomy. The patient's acute hospital course was complicated by worsening cytotoxic edema as well areas of hemorrhagic conversion with left to right midline shift. He was taken to the OR on 06/01/2017 for decompressive left hemicraniectomy. [name] He was found to have a right peroneal, right posterior tibial, right  gastrocnemius,left basilic, and left axillary venous thrombi. He then developed totally occluding superficial thrombophlebitis of the left cephalic vein and right cephalic vein. Venous thrombus then progressed in the left upper arm to the left brachial vein. The patient was not anticoagulated due to recent hemorrhagic conversion of stroke. PEG was placed by Dr. Eilleen Stephens on 06/15/2017. There was progression of right lower extremity DVT from the posterior tibial vein to the right common femoral vein. Vascular surgery was consulted and placed an inferior vena cava filter on 06/18/2017. expressive aphasia, verbal apraxia; therapy at [facility] January 2019, then [facility] rehab for 4 weeks, then [facility] therapy PT, OT, speech discharged 09/21/17    Patient Stated Goals  Per wife:  to get strong enough to get upstairs, and use the bathroom on his own; trying to be more mobile outside the home-fishing, more recreational things    Currently in Pain?  No/denies    Pain Onset  Tim Sella Adult PT Treatment/Exercise - 04/09/18 1417      Ambulation/Gait   Ambulation/Gait  Yes    Ambulation/Gait Assistance  4: Min guard    Ambulation/Gait Assistance Details  decreased activation of RUE today; required increased cues to activate to place and remove RUE from hand orthosis and for activation  of RUE when lifting RW up/down curb, over obstacles and when turning to L and R.  Cues to slow gait in order to perform more safely when performing obstacles: weaving L and R around obstacles, stepping over low obstacles and across red foam mat to simulate thick carpet to simulate use of RW in home environment    Ambulation Distance (Feet)  300 Feet    Assistive device  Rolling walker    Gait Pattern  Step-through pattern;Decreased step length - right;Decreased stance time - right;Decreased hip/knee flexion - right;Decreased dorsiflexion - right;Right genu recurvatum    Ambulation  Surface  Level;Indoor    Ramp  4: Min assist    Ramp Details (indicate cue type and reason)  with RW with cues for upright posture    Curb  4: Min assist    Curb Details (indicate cue type and reason)  with RW, multiple attempts with min A and verbal cues for use of RUE to fully lift R side of RW onto and off of curb and to fully clear RLE up and down curb          Balance Exercises - 04/09/18 1425      Balance Exercises: Standing   Turning  Right;Left;3 reps   figure 8 gait with RW to work on RUE activation with turns       PT Education - 04/09/18 1426    Education provided  Yes    Education Details  correction of visits based on VL; plan for final 3 visits, gait with RW    Person(s) Educated  Patient;Spouse    Methods  Demonstration;Explanation    Comprehension  Need further instruction;Verbalized understanding       PT Short Term Goals - 03/28/18 2121      PT SHORT TERM GOAL #1   Title  Pt will perform updated HEP with wife's assistance for improved strength, balance, and gait.  TARGET 03/23/18    Time  4   per recert 1/60/10   Period  Weeks    Status  Achieved      PT SHORT TERM GOAL #2   Title  Pt will perform sit<>stand modified independently for improved transfer efficiency and safety.    Baseline  mod I sit to stand; supervision for stand pivot with cane and AFO    Time  4    Period  Weeks    Status  Partially Met      PT SHORT TERM GOAL #3   Title  Pt will ambulate at least 200 ft using SBQC and AFO with supervision for improved gait safety and independence.    Baseline  230' with quad based cane, AFO, supervision making left and right turns with intermittent min A when R foot catches    Time  4    Period  Weeks    Status  Partially Met      PT SHORT TERM GOAL #4   Title  Pt will negotiate one step into downstairs bathroom at home, using [facility] with minimal assistance, for improved ADL participation and independence at home.    Baseline  mod assist with  [facility]; is using hemiwalker at home    Time  4    Period  Weeks    Status  Achieved        PT Long Term Goals - 03/28/18 2117      PT LONG TERM GOAL #1   Title  Pt/wife will verbalize plans for continued community  fitness upon d/c from PT.      Baseline  Will continue to address as pt nears d/c    Time  8   per recert 08/12/00/54   Period  Weeks    Status  Revised    Target Date  04/22/18      PT LONG TERM GOAL #2   Title  Pt will improve Berg Balance test to at least 35/56 for decreased fall risk.    Baseline  27/56 02/20/18    Time  8    Period  Weeks    Status  Revised    Target Date  04/22/18      PT LONG TERM GOAL #3   Title  Pt will ambulate at least 300 ft using least restrictive assistive device (SBQC versus rubber tip quad cane), modified independently, for improved safety and independence with gait.    Baseline   115 ft 02/22/18 with min assist with [facility]    Time  8    Period  Weeks    Status  Revised    Target Date  04/22/18      PT LONG TERM GOAL #4   Title  Pt will improve gait velocity to >/= 1.3 ft/sec with use of appropriate AFO and LRAD for improved gait efficiency and safety.    Baseline   0.73 ft/sec 02/20/18    Time  8    Period  Weeks    Status  Revised    Target Date  04/22/18      PT LONG TERM GOAL #5   Title  Pt will negotiate 12 steps using handrail, with supervision, for improved safety and independence with stair negotiation for home.    Baseline  4 steps min assist 02/20/18    Time  8    Period  Weeks    Status  Revised    Target Date  04/22/18      PT LONG TERM GOAL #6   Title  Pt and wife will verbalize/demonstrate maneuvers to decrease residual episodes of BPPV.    Status  Revised    Target Date  04/22/18      PT LONG TERM GOAL #7   Title       Baseline               Plan - 04/09/18 1428    Clinical Impression Statement  Discussed revision of visits based on insurance limitations and plan for final 3 visits; pt and wife in  agreement.  Wife states they will not pursue a new w/c at this time since their goal is for pt to eventually not use w/c.  Focused treatment session on safety and gait training with RW with hand orthosis on ramp, curb and simulating home environment obstacles.  Decreased RUE activation noted today with PT first; pt likely demonstrates improved RUE activation and safety with gait following OT interventions for UE.  Will assess LTG and finalize HEP next two visits.    Rehab Potential  Good    Clinical Impairments Affecting Rehab Potential  Good family support; severity of deficits    PT Frequency  2x / week    PT Duration  8 weeks   per recert 2/70/62   PT Treatment/Interventions  ADLs/Self Care Home Management;DME Instruction;Gait training;Stair training;Functional mobility training;Therapeutic activities;Therapeutic exercise;Balance training;Orthotic Fit/Training;Patient/family education;Neuromuscular re-education;Canalith Repostioning;Vestibular;Electrical Stimulation    PT Next Visit Plan  CHECK BP AND MONITOR FOR ORTHOSTASIS/OR HIGH BP AFTER ACTIVITY. CHECK LTG and finalize HEP.  Check with pt/wife on how they are doing with OT's activity list for home   PT Home Exercise Plan  [id number] (medBridge)    Consulted and Agree with Plan of Care  Patient;Family member/caregiver    Family Member Consulted  wife       Patient will benefit from skilled therapeutic intervention in order to improve the following deficits and impairments:  Abnormal gait, Decreased activity tolerance, Decreased balance, Decreased knowledge of precautions, Decreased endurance, Decreased knowledge of use of DME, Decreased mobility, Decreased safety awareness, Difficulty walking, Decreased strength, Impaired tone, Postural dysfunction, Dizziness  Visit Diagnosis: Muscle weakness (generalized)  Unsteadiness on feet  Other abnormalities of gait and mobility  Abnormal posture     Problem List There are no active  problems to display for this patient.   Rico Junker, PT, DPT 04/09/18    2:32 PM    [facility] [address] [place], [place], [address] Phone: [phone]   Fax:  [phone]  Name: Tim Stephens MRN: [id number] Date of Birth: 27-Jul-1955

## 2018-04-09 NOTE — Telephone Encounter (Signed)
Fax request sent to referring physician, Dr. Cephus Richer, requesting prescription for rolling walker.  Will disregard order for cane.  Pt is demonstrating new activation in RUE and benefits from use of RW for WB/activation of RUE to improve postural control and safety with gait.    Dierdre Highman, PT, DPT 04/09/18    2:34 PM

## 2018-04-09 NOTE — Therapy (Signed)
[facility] [address] [place], [place], [address] Phone: [phone]   Fax:  [phone]  Occupational Therapy Treatment  Patient Details  Name: Tim Stephens MRN: [id number] Date of Birth: February 10, 1956 No data recorded  Encounter Date: 04/09/2018  OT End of Session - 04/09/18 1631    Visit Number  24    Number of Visits  33   pt only has 30 visits per calendar year   Authorization Type  [employer]    Authorization Time Period  pt has been approved for 8 additional visits by 06/11/2018 (30 total)    Authorization - Visit Number  24    Authorization - Number of Visits  30    OT Start Time  1403    OT Stop Time  1445    OT Time Calculation (min)  42 min    Activity Tolerance  Patient tolerated treatment well       History reviewed. No pertinent past medical history.  History reviewed. No pertinent surgical history.  There were no vitals filed for this visit.  Subjective Assessment - 04/09/18 1406    Subjective   Oh wow (when able to hold object in R hand)    Patient is accompained by:  Family member   wife   Pertinent History  MONITOR BP!!!  05/30/2017 admitted to [facility] with LMI cutoff, s/p thromectomy with resultant L CVA due mulitple thromboses;  IVC filter with Coumadin, 06/01/2017 craniotomy    Patient Stated Goals  unable to state due to aphasia    Currently in Pain?  No/denies                   OT Treatments/Exercises (OP) - 04/09/18 0001      Neurological Re-education Exercises   Other Exercises 1  Neuro re ed to address development of HEP for RUE - see pt instruction section for details.  Pt with increased activation and movement in RUE today and is also today able to demonstrate isolated finger flexion /grip (vs synergy movement) and beginning extension of R hand. See pt instruction section for details. Pt benefits from RUE activity prior to using RW in order to be able to use RUE to control  walker. After RUE activity, pt able to use RW with supervision and occassional min cueing.  [name] wife Sharlynn Oliphant able to demonstrate ability to assist pt with home activities program after instruction and practice.              OT Education - 04/09/18 1629    Education provided  Yes    Education Details  upgraded HEP for RUE    Person(s) Educated  Patient;Spouse    Methods  Explanation;Demonstration;Handout    Comprehension  Verbalized understanding;Returned demonstration       OT Short Term Goals - 04/09/18 1629      OT SHORT TERM GOAL #1   Title  Pt and wife will be mod I with HEP for RUE ROM - 01/30/2018 (renewed)    Status  Achieved      OT SHORT TERM GOAL #2   Title  Pt will be min a for grooming with mod cues from wife and intermittent set up prn    Status  Achieved      OT SHORT TERM GOAL #3   Title  Pt will be mod a for UB bathing at wheelchair level with mod cues    Status  Achieved  OT SHORT TERM GOAL #4   Title  Pt will be mod a for LB bathing at sit to stand level  with mod cues and set up    Status  Achieved      OT SHORT TERM GOAL #5   Title  Pt will be max a for UB dressing at wheelchair level, mod cues    Status  Achieved      OT SHORT TERM GOAL #6   Title  Pt and wife will explore options for 3 in 1 commode for pt to use in bathroom or at bedside    Status  Achieved      OT SHORT TERM GOAL #7   Title  Pt will use RUE as stabilizer 25% of the time during basic self care skills with min a and cues.     Status  Achieved      OT SHORT TERM GOAL #8   Title  Pt will be supervion for toilet transfers - 03/29/2018    Status  Achieved      OT SHORT TERM GOAL  #9   TITLE  Pt will tolerate functional activity at ambulatory level for 5 minutes with no rest breaks    Status  Achieved        OT Long Term Goals - 04/09/18 1629      OT LONG TERM GOAL #1   Title  Pt and wife will be mod I with home activity program designed to increase independence in basic  self care as well as address cognition -04/26/2018 (renewed)    Status  On-going      OT LONG TERM GOAL #2   Title  Pt will be min a for UB bathing after set up with mod cues    Status  Achieved      OT LONG TERM GOAL #3   Title  Pt will be min a for UB dressing with mod cues    Status  Achieved      OT LONG TERM GOAL #4   Title  Pt will be min a for LB bathing with mod cues    Status  Achieved      OT LONG TERM GOAL #5   Title  Pt will be min a for LB dressing with mod cues    Status  On-going      OT LONG TERM GOAL #6   Title  Pt will be min a for toilet transfers in bathroom (able to ambulate 3 steps into bathroom with wife).      Status  Achieved      OT LONG TERM GOAL #7   Title  Pt will be mod a for clothing mmgt with toileting    Status  Achieved      OT LONG TERM GOAL #8   Title  Pt will demonstrate ability to use RUE as stabilizer during basic self care 50% of the time with min a and cues.     Status  On-going            Plan - 04/09/18 1630    Clinical Impression Statement  Pt continues to progress toward goals. Pt with improved functional movement of RUE with forced use approach.      Occupational Profile and client history currently impacting functional performance  HLD, HTN, s/p craniotomy, IVC filter with Coumadin, seizures.    Occupational performance deficits (Please refer to evaluation for details):  ADL's;IADL's;Rest and Sleep;Work;Leisure;Social Participation    Rehab  Potential  Good    Current Impairments/barriers affecting progress:  severity of deficits    OT Frequency  2x / week    OT Duration  8 weeks    OT Treatment/Interventions  Self-care/ADL training;Moist Heat;Therapeutic exercise;Neuromuscular education;DME and/or AE instruction;Manual Therapy;Functional Mobility Training;Passive range of motion;Therapeutic activities;Splinting;Patient/family education;Balance training    Consulted and Agree with Plan of Care  Patient;Family  member/caregiver    Family Member Consulted  [name]       Patient will benefit from skilled therapeutic intervention in order to improve the following deficits and impairments:  Abnormal gait, Decreased activity tolerance, Decreased balance, Decreased cognition, Decreased knowledge of use of DME, Decreased mobility, Decreased range of motion, Decreased safety awareness, Difficulty walking, Decreased strength, Impaired UE functional use, Impaired tone, Impaired sensation, Pain  Visit Diagnosis: Muscle weakness (generalized)  Other abnormalities of gait and mobility  Abnormal posture  Hemiplegia and hemiparesis following cerebral infarction affecting right dominant side (HCC)  Other symptoms and signs involving the nervous system  Other symptoms and signs involving cognitive functions following cerebral infarction  Other symptoms and signs involving the musculoskeletal system  Apraxia    Problem List There are no active problems to display for this patient.   Norton Pastel , OTR/L 04/09/2018, 4:35 PM  [facility] [address] [place], [place], [address] Phone: [phone]   Fax:  [phone]  Name: Tim Stephens MRN: [id number] Date of Birth: [birth year]

## 2018-04-11 ENCOUNTER — Ambulatory Visit: Payer: BLUE CROSS/BLUE SHIELD | Admitting: Physical Therapy

## 2018-04-11 ENCOUNTER — Ambulatory Visit: Payer: BLUE CROSS/BLUE SHIELD | Attending: Family Medicine | Admitting: Occupational Therapy

## 2018-04-11 ENCOUNTER — Encounter: Payer: Self-pay | Admitting: Occupational Therapy

## 2018-04-11 VITALS — BP 134/93

## 2018-04-11 DIAGNOSIS — R2681 Unsteadiness on feet: Secondary | ICD-10-CM | POA: Diagnosis present

## 2018-04-11 DIAGNOSIS — R293 Abnormal posture: Secondary | ICD-10-CM

## 2018-04-11 DIAGNOSIS — R29818 Other symptoms and signs involving the nervous system: Secondary | ICD-10-CM | POA: Diagnosis present

## 2018-04-11 DIAGNOSIS — M6281 Muscle weakness (generalized): Secondary | ICD-10-CM | POA: Diagnosis present

## 2018-04-11 DIAGNOSIS — R482 Apraxia: Secondary | ICD-10-CM | POA: Insufficient documentation

## 2018-04-11 DIAGNOSIS — I69318 Other symptoms and signs involving cognitive functions following cerebral infarction: Secondary | ICD-10-CM | POA: Insufficient documentation

## 2018-04-11 DIAGNOSIS — I69351 Hemiplegia and hemiparesis following cerebral infarction affecting right dominant side: Secondary | ICD-10-CM | POA: Insufficient documentation

## 2018-04-11 DIAGNOSIS — R2689 Other abnormalities of gait and mobility: Secondary | ICD-10-CM | POA: Diagnosis present

## 2018-04-11 NOTE — Therapy (Signed)
[facility] [address] [place], [place], [address] Phone: [phone]   Fax:  [phone]  Occupational Therapy Treatment  Patient Details  Name: Tim Stephens MRN: [id number] Date of Birth: 17-Jun-1956 No data recorded  Encounter Date: 04/11/2018  OT End of Session - 04/11/18 1705    Visit Number  25    Number of Visits  33    Date for OT Re-Evaluation  04/26/18    Authorization Type  BCBS    Authorization Time Period  pt has been approved for 8 additional visits by 06/11/2018 (30 total)    Authorization - Visit Number  25    Authorization - Number of Visits  30    OT Start Time  1445    OT Stop Time  1530    OT Time Calculation (min)  45 min    Activity Tolerance  Patient tolerated treatment well    Behavior During Therapy  [facility] for tasks assessed/performed       History reviewed. No pertinent past medical history.  History reviewed. No pertinent surgical history.  Vitals:   04/11/18 1451  BP: (!) 134/93    Subjective Assessment - 04/11/18 1451    Subjective   I'm good    Patient is accompained by:  Family member    Pertinent History  MONITOR BP!!!  05/30/2017 admitted to [facility] with LMI cutoff, s/p thromectomy with resultant L CVA due mulitple thromboses;  IVC filter with Coumadin, 06/01/2017 craniotomy    Patient Stated Goals  unable to state due to aphasia    Currently in Pain?  No/denies    Pain Score  0-No pain                   OT Treatments/Exercises (OP) - 04/11/18 0001      ADLs   ADL Comments  Patient's wife indicated that he is dressing himself except for socks and compression hose.        Neurological Re-education Exercises   Other Exercises 1  Neuro reeducation to address forced use concept in RUE.  In modified plantigrade, worked to [employer] demand for right arm form 4 point to three point with rotation for arm on body challenge.  Patient maintained BP throughout standing  exercise.  Standing modified push up without report of pain in wrist or shoulder - with facilitation to assure alignment of humerus in socket.  Worked on standing with flexion in modified plantigrade to address increased passive stretch of shoulder flexion.                 OT Short Term Goals - 04/09/18 1629      OT SHORT TERM GOAL #1   Title  Pt and wife will be mod I with HEP for RUE ROM - 01/30/2018 (renewed)    Status  Achieved      OT SHORT TERM GOAL #2   Title  Pt will be min a for grooming with mod cues from wife and intermittent set up prn    Status  Achieved      OT SHORT TERM GOAL #3   Title  Pt will be mod a for UB bathing at wheelchair level with mod cues    Status  Achieved      OT SHORT TERM GOAL #4   Title  Pt will be mod a for LB bathing at sit to stand level  with mod cues and set up    Status  Achieved      OT SHORT TERM GOAL #5   Title  Pt will be max a for UB dressing at wheelchair level, mod cues    Status  Achieved      OT SHORT TERM GOAL #6   Title  Pt and wife will explore options for 3 in 1 commode for pt to use in bathroom or at bedside    Status  Achieved      OT SHORT TERM GOAL #7   Title  Pt will use RUE as stabilizer 25% of the time during basic self care skills with min a and cues.     Status  Achieved      OT SHORT TERM GOAL #8   Title  Pt will be supervion for toilet transfers - 03/29/2018    Status  Achieved      OT SHORT TERM GOAL  #9   TITLE  Pt will tolerate functional activity at ambulatory level for 5 minutes with no rest breaks    Status  Achieved        OT Long Term Goals - 04/09/18 1629      OT LONG TERM GOAL #1   Title  Pt and wife will be mod I with home activity program designed to increase independence in basic self care as well as address cognition -04/26/2018 (renewed)    Status  On-going      OT LONG TERM GOAL #2   Title  Pt will be min a for UB bathing after set up with mod cues    Status  Achieved      OT  LONG TERM GOAL #3   Title  Pt will be min a for UB dressing with mod cues    Status  Achieved      OT LONG TERM GOAL #4   Title  Pt will be min a for LB bathing with mod cues    Status  Achieved      OT LONG TERM GOAL #5   Title  Pt will be min a for LB dressing with mod cues    Status  On-going      OT LONG TERM GOAL #6   Title  Pt will be min a for toilet transfers in bathroom (able to ambulate 3 steps into bathroom with wife).      Status  Achieved      OT LONG TERM GOAL #7   Title  Pt will be mod a for clothing mmgt with toileting    Status  Achieved      OT LONG TERM GOAL #8   Title  Pt will demonstrate ability to use RUE as stabilizer during basic self care 50% of the time with min a and cues.     Status  On-going            Plan - 04/11/18 1705    Clinical Impression Statement  Pt continues to progress toward goals. Pt with improved functional movement of RUE with forced use approach.      Occupational Profile and client history currently impacting functional performance  HLD, HTN, s/p craniotomy, IVC filter with Coumadin, seizures.    Occupational performance deficits (Please refer to evaluation for details):  ADL's;IADL's;Rest and Sleep;Work;Leisure;Social Participation    Rehab Potential  Good    Current Impairments/barriers affecting progress:  severity of deficits    OT Frequency  2x / week    OT Duration  8 weeks  OT Treatment/Interventions  Self-care/ADL training;Moist Heat;Therapeutic exercise;Neuromuscular education;DME and/or AE instruction;Manual Therapy;Functional Mobility Training;Passive range of motion;Therapeutic activities;Splinting;Patient/family education;Balance training    Plan  NMR trunk/RUE, sit to stand, stand to sit, balance, functional mobility, possible use of Give Mohr sling for ambulation    Clinical Decision Making  Multiple treatment options, significant modification of task necessary    Consulted and Agree with Plan of Care   Patient;Family member/caregiver    Family Member Consulted  [name]       Patient will benefit from skilled therapeutic intervention in order to improve the following deficits and impairments:  Abnormal gait, Decreased activity tolerance, Decreased balance, Decreased cognition, Decreased knowledge of use of DME, Decreased mobility, Decreased range of motion, Decreased safety awareness, Difficulty walking, Decreased strength, Impaired UE functional use, Impaired tone, Impaired sensation, Pain  Visit Diagnosis: Muscle weakness (generalized)  Hemiplegia and hemiparesis following cerebral infarction affecting right dominant side (HCC)  Abnormal posture  Other symptoms and signs involving the nervous system  Other symptoms and signs involving cognitive functions following cerebral infarction  Apraxia  Unsteadiness on feet    Problem List There are no active problems to display for this patient.   Collier Salina, OTR/L 04/11/2018, 5:06 PM  [facility] [address] [place], [place], [address] Phone: [phone]   Fax:  [phone]  Name: LASHAWN ORREGO MRN: [id number] Date of Birth: 1955-07-21

## 2018-04-16 ENCOUNTER — Ambulatory Visit: Payer: BLUE CROSS/BLUE SHIELD | Admitting: Physical Therapy

## 2018-04-19 ENCOUNTER — Ambulatory Visit: Payer: BLUE CROSS/BLUE SHIELD | Admitting: Physical Therapy

## 2018-04-19 ENCOUNTER — Ambulatory Visit: Payer: BLUE CROSS/BLUE SHIELD | Admitting: Occupational Therapy

## 2018-04-19 ENCOUNTER — Encounter: Payer: Self-pay | Admitting: Occupational Therapy

## 2018-04-19 VITALS — BP 125/90

## 2018-04-19 VITALS — BP 120/90

## 2018-04-19 DIAGNOSIS — R293 Abnormal posture: Secondary | ICD-10-CM

## 2018-04-19 DIAGNOSIS — M6281 Muscle weakness (generalized): Secondary | ICD-10-CM

## 2018-04-19 DIAGNOSIS — I69318 Other symptoms and signs involving cognitive functions following cerebral infarction: Secondary | ICD-10-CM

## 2018-04-19 DIAGNOSIS — I69351 Hemiplegia and hemiparesis following cerebral infarction affecting right dominant side: Secondary | ICD-10-CM

## 2018-04-19 DIAGNOSIS — R2681 Unsteadiness on feet: Secondary | ICD-10-CM

## 2018-04-19 DIAGNOSIS — R29818 Other symptoms and signs involving the nervous system: Secondary | ICD-10-CM

## 2018-04-19 DIAGNOSIS — R2689 Other abnormalities of gait and mobility: Secondary | ICD-10-CM

## 2018-04-19 DIAGNOSIS — R482 Apraxia: Secondary | ICD-10-CM

## 2018-04-19 NOTE — Therapy (Signed)
[facility] [address] [place], [place], [address] Phone: [phone]   Fax:  [phone]  Occupational Therapy Treatment  Patient Details  Name: Tim Stephens MRN: [id number] Date of Birth: Dec 27, 1955 No data recorded  Encounter Date: 04/19/2018  OT End of Session - 04/19/18 1549    Visit Number  26    Number of Visits  33    Date for OT Re-Evaluation  04/26/18    Authorization Type  BCBS    Authorization Time Period  pt has been approved for 8 additional visits by 06/11/2018 (30 total)    Authorization - Visit Number  26    Authorization - Number of Visits  30    OT Start Time  1445    OT Stop Time  1530    OT Time Calculation (min)  45 min    Activity Tolerance  Patient tolerated treatment well    Behavior During Therapy  [facility] for tasks assessed/performed       History reviewed. No pertinent past medical history.  History reviewed. No pertinent surgical history.  Vitals:   04/19/18 1445  BP: 125/90    Subjective Assessment - 04/19/18 1537    Subjective   All is good    Patient is accompained by:  Family member    Patient Stated Goals  unable to state due to aphasia    Currently in Pain?  No/denies    Pain Score  0-No pain                   OT Treatments/Exercises (OP) - 04/19/18 0001      ADLs   Home Maintenance  Began to work on incorporating right UE into housekeeping tasks - wiping off counters, opening closing waist height drawers, assisted grasp / release of lightweight objects in drawers.  Encouraged patient and wife to  attempt at home.  Patient needing cueing to visually attend to right hand/arm, an dto move arm separately from body - did best with active assist.        Neurological Re-education Exercises   Other Exercises 1  Neuromuscular reeducation to address postural control and dynamic weight bearing through right UE in modified plantigrade.               OT  Education - 04/19/18 1548    Education provided  Yes    Education Details  begin to use RUE to wipe down counters.  Educated wife briefly on guided assist from hand    Person(s) Educated  Patient;Spouse    Methods  Explanation;Demonstration;Handout    Comprehension  Verbalized understanding;Need further instruction       OT Short Term Goals - 04/09/18 1629      OT SHORT TERM GOAL #1   Title  Pt and wife will be mod I with HEP for RUE ROM - 01/30/2018 (renewed)    Status  Achieved      OT SHORT TERM GOAL #2   Title  Pt will be min a for grooming with mod cues from wife and intermittent set up prn    Status  Achieved      OT SHORT TERM GOAL #3   Title  Pt will be mod a for UB bathing at wheelchair level with mod cues    Status  Achieved      OT SHORT TERM GOAL #4   Title  Pt will be mod a for LB bathing at sit to stand  level  with mod cues and set up    Status  Achieved      OT SHORT TERM GOAL #5   Title  Pt will be max a for UB dressing at wheelchair level, mod cues    Status  Achieved      OT SHORT TERM GOAL #6   Title  Pt and wife will explore options for 3 in 1 commode for pt to use in bathroom or at bedside    Status  Achieved      OT SHORT TERM GOAL #7   Title  Pt will use RUE as stabilizer 25% of the time during basic self care skills with min a and cues.     Status  Achieved      OT SHORT TERM GOAL #8   Title  Pt will be supervion for toilet transfers - 03/29/2018    Status  Achieved      OT SHORT TERM GOAL  #9   TITLE  Pt will tolerate functional activity at ambulatory level for 5 minutes with no rest breaks    Status  Achieved        OT Long Term Goals - 04/19/18 1452      OT LONG TERM GOAL #1   Title  Pt and wife will be mod I with home activity program designed to increase independence in basic self care as well as address cognition -04/26/2018 (renewed)    Status  Achieved      OT LONG TERM GOAL #2   Title  Pt will be min a for UB bathing after set up  with mod cues    Status  Achieved      OT LONG TERM GOAL #3   Title  Pt will be min a for UB dressing with mod cues    Status  Achieved      OT LONG TERM GOAL #4   Title  Pt will be min a for LB bathing with mod cues    Status  Achieved      OT LONG TERM GOAL #6   Title  Pt will be min a for toilet transfers in bathroom (able to ambulate 3 steps into bathroom with wife).      Status  Achieved      OT LONG TERM GOAL #7   Title  Pt will be mod a for clothing mmgt with toileting    Status  Achieved            Plan - 04/19/18 1549    Clinical Impression Statement  Pt continues to progress toward goals. Pt with improved functional movement of RUE with forced use approach now trying to transition to home function beyond ADL.      Occupational Profile and client history currently impacting functional performance  HLD, HTN, s/p craniotomy, IVC filter with Coumadin, seizures.    Occupational performance deficits (Please refer to evaluation for details):  ADL's;IADL's;Rest and Sleep;Work;Leisure;Social Participation    Rehab Potential  Good    Current Impairments/barriers affecting progress:  severity of deficits    OT Frequency  2x / week    OT Duration  8 weeks    OT Treatment/Interventions  Self-care/ADL training;Moist Heat;Therapeutic exercise;Neuromuscular education;DME and/or AE instruction;Manual Therapy;Functional Mobility Training;Passive range of motion;Therapeutic activities;Splinting;Patient/family education;Balance training    Plan  NMR Followed with RUE Function    Clinical Decision Making  Multiple treatment options, significant modification of task necessary    Consulted and Agree with  Plan of Care  Patient;Family member/caregiver    Family Member Consulted  wife Tim Stephens       Patient will benefit from skilled therapeutic intervention in order to improve the following deficits and impairments:  Abnormal gait, Decreased activity tolerance, Decreased balance, Decreased  cognition, Decreased knowledge of use of DME, Decreased mobility, Decreased range of motion, Decreased safety awareness, Difficulty walking, Decreased strength, Impaired UE functional use, Impaired tone, Impaired sensation, Pain  Visit Diagnosis: Muscle weakness (generalized)  Hemiplegia and hemiparesis following cerebral infarction affecting right dominant side (HCC)  Abnormal posture  Other symptoms and signs involving the nervous system  Other symptoms and signs involving cognitive functions following cerebral infarction  Apraxia  Unsteadiness on feet    Problem List There are no active problems to display for this patient.   Collier Salina, OTR/L 04/19/2018, 3:52 PM  [facility] [address] [place], [place], [address] Phone: [phone]   Fax:  [phone]  Name: Tim Stephens MRN: [id number] Date of Birth: 11-Nov-1955

## 2018-04-19 NOTE — Therapy (Signed)
Tim Stephens [address] [place], [place], [address] Phone: [phone]   Fax:  [phone]  Physical Therapy Treatment  Patient Details  Name: Tim Stephens MRN: [id number] Date of Birth: 08-Oct-1955 Referring Provider (PT): Tim Ar, MD   Encounter Date: 04/19/2018  PT End of Session - 04/19/18 2054    Visit Number  29    Number of Visits  30   VL: 30    Date for PT Re-Evaluation  04/22/18    Authorization Type  BCBS- must request more visits and date extension.  VL: 30 per discipline    Authorization Time Period  additional 8 visits approved 03/01/18-/05/01/18    Authorization - Visit Number  29    Authorization - Number of Visits  30   30 VL per year for each discipline   PT Start Time  1400    PT Stop Time  1445    PT Time Calculation (min)  45 min    Activity Tolerance  Patient tolerated treatment well    Behavior During Therapy  [facility] for tasks assessed/performed       No past medical history on file.  No past surgical history on file.  Vitals:   04/19/18 1413  BP: 120/90    Subjective Assessment - 04/19/18 1413    Subjective  Did a lot of walking in the community, walked into and out of restaurants with cane.  Was able to go and get RW from [employer].    Patient is accompained by:  Family member   wife   Pertinent History  Skull flap surgery 11/02/17.  PMH significant for hypertension and hyperlipidemia who was admitted to [facility] on 05/30/2017 with a aphasia and right hemibody weakness. CTA was obtained and revealed left M1 cutoff; cerebral angiography with mechanical thrombectomy. The patient's acute hospital course was complicated by worsening cytotoxic edema as well areas of hemorrhagic conversion with left to right midline shift. He was taken to the OR on 06/01/2017 for decompressive left hemicraniectomy. [name] He was found to have a right peroneal, right posterior tibial, right gastrocnemius,left  basilic, and left axillary venous thrombi. He then developed totally occluding superficial thrombophlebitis of the left cephalic vein and right cephalic vein. Venous thrombus then progressed in the left upper arm to the left brachial vein. The patient was not anticoagulated due to recent hemorrhagic conversion of stroke. PEG was placed by Dr. [name] on 06/15/2017. There was progression of right lower extremity DVT from the posterior tibial vein to the right common femoral vein. Vascular surgery was consulted and placed an inferior vena cava filter on 06/18/2017. expressive aphasia, verbal apraxia; therapy at [facility] January 2019, then [facility] rehab for 4 weeks, then [facility] therapy PT, OT, speech discharged 09/21/17    Patient Stated Goals  Per wife:  to get strong enough to get upstairs, and use the bathroom on his own; trying to be more mobile outside the home-fishing, more recreational things    Currently in Pain?  No/denies    Pain Onset  [name] PT Assessment - 04/19/18 1416      Ambulation/Gait   Ambulation/Gait  Yes    Ambulation/Gait Assistance  4: Min guard    Ambulation/Gait Assistance Details  with RW and hand orthosis with intermittent cues required for more upright posture, to activate RUE when pivoting and turning to R/L and to increase step length and clearance of RLE during  changes in direction    Ambulation Distance (Feet)  322 Feet    Assistive device  Rolling walker    Gait Pattern  Step-through pattern;Decreased step length - right;Decreased stance time - right;Decreased hip/knee flexion - right;Decreased dorsiflexion - right;Right genu recurvatum    Ambulation Surface  Level;Indoor    Stairs  Yes    Stairs Assistance  4: Min assist    Stairs Assistance Details (indicate cue type and reason)  as pt fatigued he demonstrated decreased ability to fully clear R foot when ascending with alternating sequence; also required increased assistance to descend  safely with step to sequence    Stair Management Technique  One rail Left;Alternating pattern;Step to pattern;Forwards    Number of Stairs  12    Height of Stairs  6      Standardized Balance Assessment   Standardized Balance Assessment  Berg Balance Test;10 meter walk test    10 Meter Walk  21.09 seconds or 1.55 ft/sec with RW      Berg Balance Test   Sit to Stand  Able to stand without using hands and stabilize independently    Standing Unsupported  Able to stand safely 2 minutes    Sitting with Back Unsupported but Feet Supported on Floor or Stool  Able to sit safely and securely 2 minutes    Stand to Sit  Sits safely with minimal use of hands    Transfers  Able to transfer safely, definite need of hands    Standing Unsupported with Eyes Closed  Able to stand 10 seconds safely    Standing Ubsupported with Feet Together  Able to place feet together independently and stand for 1 minute with supervision    From Standing, Reach Forward with Outstretched Arm  Can reach confidently >25 cm (10")    From Standing Position, Pick up Object from Floor  Able to pick up shoe, needs supervision    From Standing Position, Turn to Look Behind Over each Shoulder  Looks behind from both sides and weight shifts well    Turn 360 Degrees  Able to turn 360 degrees safely but slowly    Standing Unsupported, Alternately Place Feet on Step/Stool  Able to complete >2 steps/needs minimal assist    Standing Unsupported, One Foot in Front  Able to plae foot ahead of the other independently and hold 30 seconds    Standing on One Leg  Able to lift leg independently and hold 5-10 seconds    Total Score  46    Berg comment:  46/56                           PT Education - 04/19/18 2053    Education provided  Yes    Education Details  progress towards LTG; focus of final therapy session, educated on safety during stair negotiation when fatigued, gait with RW with hand orthosis    Person(s)  Educated  Patient;Spouse    Methods  Explanation;Demonstration    Comprehension  Verbalized understanding;Returned demonstration       PT Short Term Goals - 03/28/18 2121      PT SHORT TERM GOAL #1   Title  Pt will perform updated HEP with wife's assistance for improved strength, balance, and gait.  TARGET 03/23/18    Time  4   per recert 9/70/26   Period  Weeks    Status  Achieved      PT SHORT  TERM GOAL #2   Title  Pt will perform sit<>stand modified independently for improved transfer efficiency and safety.    Baseline  mod I sit to stand; supervision for stand pivot with cane and AFO    Time  4    Period  Weeks    Status  Partially Met      PT SHORT TERM GOAL #3   Title  Pt will ambulate at least 200 ft using SBQC and AFO with supervision for improved gait safety and independence.    Baseline  230' with quad based cane, AFO, supervision making left and right turns with intermittent min A when R foot catches    Time  4    Period  Weeks    Status  Partially Met      PT SHORT TERM GOAL #4   Title  Pt will negotiate one step into downstairs bathroom at home, using [facility] with minimal assistance, for improved ADL participation and independence at home.    Baseline  mod assist with [facility]; is using hemiwalker at home    Time  4    Period  Weeks    Status  Achieved        PT Long Term Goals - 04/19/18 2054      PT LONG TERM GOAL #1   Title  Pt/wife will verbalize plans for continued community fitness upon d/c from PT.      Baseline  Will continue to address as pt nears d/c    Time  8   per recert 09/14/08/62   Period  Weeks    Status  On-going      PT LONG TERM GOAL #2   Title  Pt will improve Berg Balance test to at least 35/56 for decreased fall risk.    Baseline  46/56    Time  8    Period  Weeks    Status  Achieved      PT LONG TERM GOAL #3   Title  Pt will ambulate at least 300 ft using least restrictive assistive device (SBQC versus rubber tip quad cane), modified  independently, for improved safety and independence with gait.    Baseline  322' with RW with hand orthosis for improved posture and WB through RUE    Time  8    Period  Weeks    Status  Achieved      PT LONG TERM GOAL #4   Title  Pt will improve gait velocity to >/= 1.3 ft/sec with use of appropriate AFO and LRAD for improved gait efficiency and safety.    Baseline  1.55 ft/sec with RW and AFO    Time  8    Period  Weeks    Status  Achieved      PT LONG TERM GOAL #5   Title  Pt will negotiate 12 steps using handrail, with supervision, for improved safety and independence with stair negotiation for home.    Baseline  12 with min A    Time  8    Period  Weeks    Status  Partially Met      PT LONG TERM GOAL #6   Title  Pt and wife will verbalize/demonstrate maneuvers to decrease residual episodes of BPPV.    Status  Achieved      PT LONG TERM GOAL #7   Title       Baseline  Plan - 04/19/18 2056    Clinical Impression Statement  Began assessment of LTG with BERG, gait velocity, gait in controlled, indoor environment with patient's new RW and hand orthosis, and stair negotiation.  Pt is making excellent progress but continues to demonstrate decreased safety and R sided motor control as he fatigues.  If insurance will allow more visits, will likely recertify pt to continue to address.  If insurance visit limit is a hard max of 30, will finalize HEP at next visit and D/C.  Pt and wife in agreement with plan.    Rehab Potential  Good    Clinical Impairments Affecting Rehab Potential  Good family support; severity of deficits    PT Frequency  2x / week    PT Duration  8 weeks   per recert 1/51/76   PT Treatment/Interventions  ADLs/Self Care Home Management;DME Instruction;Gait training;Stair training;Functional mobility training;Therapeutic activities;Therapeutic exercise;Balance training;Orthotic Fit/Training;Patient/family education;Neuromuscular re-education;Canalith  Repostioning;Vestibular;Electrical Stimulation    PT Next Visit Plan  CHECK BP AND MONITOR FOR ORTHOSTASIS/OR HIGH BP AFTER ACTIVITY. Finalize HEP.  What did [name] find out about insurance VL: hard stop or can go over 30 for medical necessity?   Check with pt/wife on how they are doing with OT's activity list for home   PT Home Exercise Plan  [email or web address] (medBridge)    Consulted and Agree with Plan of Care  Patient;Family member/caregiver    Family Member Consulted  wife       Patient will benefit from skilled therapeutic intervention in order to improve the following deficits and impairments:  Abnormal gait, Decreased activity tolerance, Decreased balance, Decreased knowledge of precautions, Decreased endurance, Decreased knowledge of use of DME, Decreased mobility, Decreased safety awareness, Difficulty walking, Decreased strength, Impaired tone, Postural dysfunction, Dizziness  Visit Diagnosis: Muscle weakness (generalized)  Abnormal posture  Unsteadiness on feet  Other abnormalities of gait and mobility     Problem List There are no active problems to display for this patient.  Tim Stephens, PT, DPT 04/19/18    9:03 PM    [facility] [address] [place], [place], [address] Phone: [phone]   Fax:  [phone]  Name: Tim Stephens MRN: [id number] Date of Birth: 02-16-1956

## 2018-04-24 ENCOUNTER — Ambulatory Visit: Payer: BLUE CROSS/BLUE SHIELD | Admitting: Rehabilitation

## 2018-04-27 ENCOUNTER — Encounter: Payer: Self-pay | Admitting: Physical Therapy

## 2018-04-27 ENCOUNTER — Ambulatory Visit: Payer: BLUE CROSS/BLUE SHIELD | Admitting: Physical Therapy

## 2018-04-27 DIAGNOSIS — R2681 Unsteadiness on feet: Secondary | ICD-10-CM

## 2018-04-27 DIAGNOSIS — M6281 Muscle weakness (generalized): Secondary | ICD-10-CM

## 2018-04-27 DIAGNOSIS — R2689 Other abnormalities of gait and mobility: Secondary | ICD-10-CM

## 2018-04-27 DIAGNOSIS — R293 Abnormal posture: Secondary | ICD-10-CM

## 2018-04-27 NOTE — Therapy (Signed)
[facility] [address] [place], [place], [address] Phone: [phone]   Fax:  [phone]  Physical Therapy Treatment and D/C Summary  Patient Details  Name: Tim Stephens MRN: [id number] Date of Birth: 27-Dec-1955 Referring Provider (PT): Scarlette Ar, MD   Encounter Date: 04/27/2018  PT End of Session - 04/27/18 1500    Visit Number  30    Number of Visits  30   VL: 30    Date for PT Re-Evaluation  04/22/18    Authorization Type  BCBS- must request more visits and date extension.  VL: 30 per discipline    Authorization Time Period  additional 8 visits approved 03/01/18-/05/01/18    Authorization - Visit Number  29    Authorization - Number of Visits  30   30 VL per year for each discipline   PT Start Time  5701    PT Stop Time  1450    PT Time Calculation (min)  45 min    Activity Tolerance  Patient tolerated treatment well    Behavior During Therapy  [facility] for tasks assessed/performed       History reviewed. No pertinent past medical history.  History reviewed. No pertinent surgical history.  There were no vitals filed for this visit.  Subjective Assessment - 04/27/18 1408    Subjective  Walked in from car with RW today.  Walked back into treatment gym with RW and supervision.  Jessica contacted insurance - VL is 30 max, hard stop.  Re-starts in January.    Patient is accompained by:  Family member   wife   Pertinent History  Skull flap surgery 11/02/17.  PMH significant for hypertension and hyperlipidemia who was admitted to [facility] on 05/30/2017 with a aphasia and right hemibody weakness. CTA was obtained and revealed left M1 cutoff; cerebral angiography with mechanical thrombectomy. The patient's acute hospital course was complicated by worsening cytotoxic edema as well areas of hemorrhagic conversion with left to right midline shift. He was taken to the OR on 06/01/2017 for decompressive left hemicraniectomy.  [name] He was found to have a right peroneal, right posterior tibial, right gastrocnemius,left basilic, and left axillary venous thrombi. He then developed totally occluding superficial thrombophlebitis of the left cephalic vein and right cephalic vein. Venous thrombus then progressed in the left upper arm to the left brachial vein. The patient was not anticoagulated due to recent hemorrhagic conversion of stroke. PEG was placed by Dr. [name] on 06/15/2017. There was progression of right lower extremity DVT from the posterior tibial vein to the right common femoral vein. Vascular surgery was consulted and placed an inferior vena cava filter on 06/18/2017. expressive aphasia, verbal apraxia; therapy at [facility] January 2019, then [facility] rehab for 4 weeks, then [facility] therapy PT, OT, speech discharged 09/21/17    Patient Stated Goals  Per wife:  to get strong enough to get upstairs, and use the bathroom on his own; trying to be more mobile outside the home-fishing, more recreational things    Currently in Pain?  No/denies    Pain Onset  Yesterday         04/27/18 1514  Bed Mobility  Bed Mobility Supine to Sit  Supine to Sit Minimal Assistance - Patient > 75%  Sit to Supine Minimal Assistance - Patient > 75%  Transfers  Transfers Sit to Stand;Stand to Sit  Sit to Stand 5: Supervision  Stand to Sit 5: Supervision  Ambulation/Gait  Ambulation/Gait Yes  Ambulation/Gait Assistance 5: Supervision  Ambulation/Gait Assistance Details from waiting area <> treatment gym; no use of w/c today for car <> clinic  Ambulation Distance (Feet) 115 Feet  Assistive device Rolling walker  Gait Pattern Step-through pattern;Decreased step length - right;Decreased stance time - right;Decreased dorsiflexion - right;Right genu recurvatum  Ambulation Surface Level;Indoor     Access Code: [email or web address]  URL: [email or web address]  Date: 04/27/2018  Prepared by: Misty Stanley   Exercises  Single  Leg Stance with Support - 3 sets - 10 second hold - 1x daily - 5x weekly  Standing Hip Abduction - 10 reps - 2 sets - 1x daily - 5x weekly  Stool Scoots - 10 reps - 3 sets - 1x daily - 5x weekly  Single Leg Bridge - 5 reps - 2 sets - 1x daily - 7x weekly  Sit to Stand with AFO and UE Assist in LE Alignment - 10 reps - 2 sets - 1x daily - 7x weekly  Half Tandem Stance with Head Rotation on Foam Pad - 10 reps - 4 sets - 1x daily - 7x weekly  Standing Quarter Turn with Counter Support - 10 reps - 2 sets - 1x daily - 7x weekly        PT Education - 04/27/18 1457    Education provided  Yes    Education Details  final HEP, continued recommendations for OOB activity    Person(s) Educated  Patient;Spouse    Methods  Explanation;Demonstration;Handout    Comprehension  Verbalized understanding;Returned demonstration       PT Short Term Goals - 03/28/18 2121      PT SHORT TERM GOAL #1   Title  Pt will perform updated HEP with wife's assistance for improved strength, balance, and gait.  TARGET 03/23/18    Time  4   per recert 9/40/76   Period  Weeks    Status  Achieved      PT SHORT TERM GOAL #2   Title  Pt will perform sit<>stand modified independently for improved transfer efficiency and safety.    Baseline  mod I sit to stand; supervision for stand pivot with cane and AFO    Time  4    Period  Weeks    Status  Partially Met      PT SHORT TERM GOAL #3   Title  Pt will ambulate at least 200 ft using SBQC and AFO with supervision for improved gait safety and independence.    Baseline  230' with quad based cane, AFO, supervision making left and right turns with intermittent min A when R foot catches    Time  4    Period  Weeks    Status  Partially Met      PT SHORT TERM GOAL #4   Title  Pt will negotiate one step into downstairs bathroom at home, using [facility] with minimal assistance, for improved ADL participation and independence at home.    Baseline  mod assist with [facility]; is using  hemiwalker at home    Time  4    Period  Weeks    Status  Achieved        PT Long Term Goals - 04/27/18 1510      PT LONG TERM GOAL #1   Title  Pt/wife will verbalize plans for continued community fitness upon d/c from PT.      Baseline  Will continue to address as pt nears d/c    Time  8  per recert 12/16/59/95   Period  Weeks    Status  Achieved      PT LONG TERM GOAL #2   Title  Pt will improve Berg Balance test to at least 35/56 for decreased fall risk.    Baseline  46/56    Time  8    Period  Weeks    Status  Achieved      PT LONG TERM GOAL #3   Title  Pt will ambulate at least 300 ft using least restrictive assistive device (SBQC versus rubber tip quad cane), modified independently, for improved safety and independence with gait.    Baseline  322' with RW with hand orthosis for improved posture and WB through RUE    Time  8    Period  Weeks    Status  Achieved      PT LONG TERM GOAL #4   Title  Pt will improve gait velocity to >/= 1.3 ft/sec with use of appropriate AFO and LRAD for improved gait efficiency and safety.    Baseline  1.55 ft/sec with RW and AFO    Time  8    Period  Weeks    Status  Achieved      PT LONG TERM GOAL #5   Title  Pt will negotiate 12 steps using handrail, with supervision, for improved safety and independence with stair negotiation for home.    Baseline  12 with min A    Time  8    Period  Weeks    Status  Partially Met      PT LONG TERM GOAL #6   Title  Pt and wife will verbalize/demonstrate maneuvers to decrease residual episodes of BPPV.    Status  Achieved      PT LONG TERM GOAL #7   Title       Baseline               Plan - 04/27/18 1501    Clinical Impression Statement  Final session focused on review of final HEP.  Updated HEP adding in supine single leg bridges for coordination and reciprocal LE activation, weight shifting and strengthening, standing balance on compliant surface with narrow BOS and head turns for  multi-sensory balance training and quarter turns to focus on weight shifting and safety with pivoting.  Pt tolerated well; wife observed safe performance of exercises in order to guard and cue at home.  Pt has made excellent progress and has met 5/6 LTG with significant improvement in upright activity tolerance, standing balance, LE strength and gait; pt partially met stair negotiation goal but continues to require min A when fatigued due to decreased LE clearance and increased risk of catching R foot on stair.  Pt to D/C at this time but will likely return for further PT at beginning of year.    Rehab Potential  Good    Clinical Impairments Affecting Rehab Potential  Good family support; severity of deficits    PT Frequency  2x / week    PT Duration  8 weeks   per recert 0/93/26   PT Treatment/Interventions  ADLs/Self Care Home Management;DME Instruction;Gait training;Stair training;Functional mobility training;Therapeutic activities;Therapeutic exercise;Balance training;Orthotic Fit/Training;Patient/family education;Neuromuscular re-education;Canalith Repostioning;Vestibular;Electrical Stimulation    PT Next Visit Plan  --   Check with pt/wife on how they are doing with OT's activity list for home   PT Home Exercise Plan  [email or web address] (medBridge)    Consulted and Agree with Plan of Care  Patient;Family member/caregiver    Family Member Consulted  wife       Patient will benefit from skilled therapeutic intervention in order to improve the following deficits and impairments:  Abnormal gait, Decreased activity tolerance, Decreased balance, Decreased knowledge of precautions, Decreased endurance, Decreased knowledge of use of DME, Decreased mobility, Decreased safety awareness, Difficulty walking, Decreased strength, Impaired tone, Postural dysfunction, Dizziness  Visit Diagnosis: Muscle weakness (generalized)  Unsteadiness on feet  Other abnormalities of gait and mobility  Abnormal  posture     Problem List There are no active problems to display for this patient.  PHYSICAL THERAPY DISCHARGE SUMMARY  Visits from Start of Care: 30  Current functional level related to goals / functional outcomes: See impression statement and LTG above   Remaining deficits: Decreased LE strength and motor planning, impaired endurance, impaired balance and gait   Education / Equipment: HEP, AFO, RW with hand orthosis  Plan: Patient agrees to discharge.  Patient goals were met. Patient is being discharged due to meeting the stated rehab goals.  ?????     Rico Junker, PT, DPT 04/27/18    3:17 PM    [facility] [address] [place], [place], [address] Phone: [phone]   Fax:  [phone]  Name: Tim Stephens MRN: [id number] Date of Birth: [birth year]

## 2018-04-27 NOTE — Patient Instructions (Signed)
Access Code: [email or web address]  URL: [email or web address]  Date: 04/27/2018  Prepared by: Bufford Lope   Exercises  Single Leg Stance with Support - 3 sets - 10 second hold - 1x daily - 5x weekly  Standing Hip Abduction - 10 reps - 2 sets - 1x daily - 5x weekly  Stool Scoots - 10 reps - 3 sets - 1x daily - 5x weekly  Single Leg Bridge - 5 reps - 2 sets - 1x daily - 7x weekly  Sit to Stand with AFO and UE Assist in LE Alignment - 10 reps - 2 sets - 1x daily - 7x weekly  Half Tandem Stance with Head Rotation on Foam Pad - 10 reps - 4 sets - 1x daily - 7x weekly  Standing Quarter Turn with Counter Support - 10 reps - 2 sets - 1x daily - 7x weekly

## 2018-05-01 ENCOUNTER — Ambulatory Visit: Payer: BLUE CROSS/BLUE SHIELD | Admitting: Rehabilitation

## 2018-05-03 ENCOUNTER — Ambulatory Visit: Payer: BLUE CROSS/BLUE SHIELD | Admitting: Physical Therapy

## 2018-05-07 ENCOUNTER — Ambulatory Visit: Payer: BLUE CROSS/BLUE SHIELD | Admitting: Rehabilitation

## 2018-05-09 ENCOUNTER — Ambulatory Visit: Payer: BLUE CROSS/BLUE SHIELD | Admitting: Physical Therapy

## 2018-05-14 ENCOUNTER — Ambulatory Visit: Payer: BLUE CROSS/BLUE SHIELD | Attending: Family Medicine | Admitting: Occupational Therapy

## 2018-05-14 ENCOUNTER — Encounter: Payer: Self-pay | Admitting: Occupational Therapy

## 2018-05-14 DIAGNOSIS — I69351 Hemiplegia and hemiparesis following cerebral infarction affecting right dominant side: Secondary | ICD-10-CM | POA: Insufficient documentation

## 2018-05-14 DIAGNOSIS — I69318 Other symptoms and signs involving cognitive functions following cerebral infarction: Secondary | ICD-10-CM | POA: Insufficient documentation

## 2018-05-14 DIAGNOSIS — R293 Abnormal posture: Secondary | ICD-10-CM | POA: Diagnosis present

## 2018-05-14 DIAGNOSIS — R29898 Other symptoms and signs involving the musculoskeletal system: Secondary | ICD-10-CM | POA: Diagnosis present

## 2018-05-14 DIAGNOSIS — R29818 Other symptoms and signs involving the nervous system: Secondary | ICD-10-CM | POA: Diagnosis present

## 2018-05-14 DIAGNOSIS — M6281 Muscle weakness (generalized): Secondary | ICD-10-CM | POA: Insufficient documentation

## 2018-05-14 DIAGNOSIS — R482 Apraxia: Secondary | ICD-10-CM | POA: Diagnosis present

## 2018-05-14 DIAGNOSIS — R2681 Unsteadiness on feet: Secondary | ICD-10-CM | POA: Diagnosis present

## 2018-05-14 NOTE — Therapy (Signed)
[facility] [address] [place], [place], [address] Phone: [phone]   Fax:  [phone]  Occupational Therapy Treatment  Patient Details  Name: Tim Stephens MRN: [id number] Date of Birth: May 31, 1956 No data recorded  Encounter Date: 05/14/2018  OT End of Session - 05/14/18 1656    Visit Number  27    Number of Visits  30    Date for OT Re-Evaluation  06/11/18    Authorization Type  BCBS    Authorization Time Period  pt has been approved for 8 additional visits by 06/11/2018 (30 total)    Authorization - Visit Number  27    Authorization - Number of Visits  30    OT Start Time  1402    OT Stop Time  1445    OT Time Calculation (min)  43 min    Activity Tolerance  Patient tolerated treatment well       History reviewed. No pertinent past medical history.  History reviewed. No pertinent surgical history.  There were no vitals filed for this visit.  Subjective Assessment - 05/14/18 1403    Subjective   Wife reports BP has been stable.      Pertinent History  MONITOR BP!!!  05/30/2017 admitted to [facility] with LMI cutoff, s/p thromectomy with resultant L CVA due mulitple thromboses;  IVC filter with Coumadin, 06/01/2017 craniotomy    Patient Stated Goals  unable to state due to aphasia    Currently in Pain?  No/denies                   OT Treatments/Exercises (OP) - 05/14/18 0001      ADLs   ADL Comments  Pt continues to receive assistance for dressing at home.  Practiced dressing and undressing shirt, pants, socks and shoes with brace.  Pt able to complete with assistance only for donning R shoe/AFO, TED hose.  Strongly encouraged pt and wife to ensure that pt at this point completes dressing with only this assistance and supervision for standing balance.  Pt and wife verbalized understanding.        Neurological Re-education Exercises   Other Exercises 1  Pt returns to clinic today after begin on  hold for OT in order to maximize remaining visits. Pt walked into clinic from parking lot using RW (no wheelchair) and also walked back into clinic with supervision only.  Neuro re ed for RUE/trunk control in sitting to address closed chain low to mid reach using moveable surface with intermittent manual resistance to increase load.  Also addressed heavy weight bearing and as well as changing direction and force using varying degrees of resistance against therapist's shoulder.  Transitioned into supine and used PVC square to address place and hold at end range of chest press - pt's performance improves with repetition.  This is the first time pt has been able to complete place and hold closed chain in this position with cues only.               OT Education - 05/14/18 1653    Education provided  Yes    Education Details  Reviewed and practiced dressing and undressing     Person(s) Educated  Patient;Spouse    Methods  Explanation;Demonstration    Comprehension  Verbalized understanding;Returned demonstration       OT Short Term Goals - 05/14/18 1411      OT SHORT TERM GOAL #1   Title  Pt  and wife will be mod I with HEP for RUE ROM - 01/30/2018 (renewed)  (Pended)     Status  Achieved  (Pended)       OT SHORT TERM GOAL #2   Title  Pt will be min a for grooming with mod cues from wife and intermittent set up prn  (Pended)     Status  Achieved  (Pended)       OT SHORT TERM GOAL #3   Title  Pt will be mod a for UB bathing at wheelchair level with mod cues  (Pended)     Status  Achieved  (Pended)       OT SHORT TERM GOAL #4   Title  Pt will be mod a for LB bathing at sit to stand level  with mod cues and set up  (Pended)     Status  Achieved  (Pended)       OT SHORT TERM GOAL #5   Title  Pt will be max a for UB dressing at wheelchair level, mod cues  (Pended)     Status  Achieved  (Pended)       OT SHORT TERM GOAL #6   Title  Pt and wife will explore options for 3 in 1 commode for  pt to use in bathroom or at bedside  (Pended)     Status  Achieved  (Pended)       OT SHORT TERM GOAL #7   Title  Pt will use RUE as stabilizer 25% of the time during basic self care skills with min a and cues.   (Pended)     Status  Achieved  (Pended)       OT SHORT TERM GOAL #8   Title  Pt will be supervion for toilet transfers - 03/29/2018  (Pended)     Status  Achieved  (Pended)       OT SHORT TERM GOAL  #9   TITLE  Pt will tolerate functional activity at ambulatory level for 5 minutes with no rest breaks  (Pended)     Status  Achieved  (Pended)         OT Long Term Goals - 05/14/18 1410      OT LONG TERM GOAL #1   Title  Pt and wife will be mod I with home activity program designed to increase independence in basic self care as well as address cognition -06/11/2018 renewed on 05/14/2018)    Status  On-going   05/14/2018 in process of upgrading HEP for RUE     OT LONG TERM GOAL #2   Title  Pt will be min a for UB bathing after set up with mod cues    Status  Achieved      OT LONG TERM GOAL #3   Title  Pt will be min a for UB dressing with mod cues    Status  Achieved      OT LONG TERM GOAL #4   Title  Pt will be min a for LB bathing with mod cues    Status  Achieved      OT LONG TERM GOAL #6   Title  Pt will be min a for toilet transfers in bathroom (able to ambulate 3 steps into bathroom with wife).      Status  Achieved      OT LONG TERM GOAL #7   Title  Pt will be mod a for clothing mmgt with toileting  Status  Achieved      OT LONG TERM GOAL #8   Title  Pt will demonstrate ability to use RUE as stabilizer during basic self care 50% of the time with min a and cues.     Status  On-going            Plan - 05/14/18 1654    Clinical Impression Statement  Pt returns to clinic after being on hold for OT to maximize remaining visits. Pt with improved activity tolerance as well as improved functional mobility    Occupational Profile and client history currently  impacting functional performance  HLD, HTN, s/p craniotomy, IVC filter with Coumadin, seizures.    Occupational performance deficits (Please refer to evaluation for details):  ADL's;IADL's;Rest and Sleep;Work;Leisure;Social Participation    Rehab Potential  Good    Current Impairments/barriers affecting progress:  severity of deficits    OT Frequency  2x / week    OT Duration  8 weeks    OT Treatment/Interventions  Self-care/ADL training;Moist Heat;Therapeutic exercise;Neuromuscular education;DME and/or AE instruction;Manual Therapy;Functional Mobility Training;Passive range of motion;Therapeutic activities;Splinting;Patient/family education;Balance training    Plan  NMR Followed with RUE Function, upgrade HEP as possible       Patient will benefit from skilled therapeutic intervention in order to improve the following deficits and impairments:  Abnormal gait, Decreased activity tolerance, Decreased balance, Decreased cognition, Decreased knowledge of use of DME, Decreased mobility, Decreased range of motion, Decreased safety awareness, Difficulty walking, Decreased strength, Impaired UE functional use, Impaired tone, Impaired sensation, Pain  Visit Diagnosis: Muscle weakness (generalized) - Plan: Ot plan of care cert/re-cert  Unsteadiness on feet - Plan: Ot plan of care cert/re-cert  Abnormal posture - Plan: Ot plan of care cert/re-cert  Hemiplegia and hemiparesis following cerebral infarction affecting right dominant side (HCC) - Plan: Ot plan of care cert/re-cert  Other symptoms and signs involving the nervous system - Plan: Ot plan of care cert/re-cert  Other symptoms and signs involving cognitive functions following cerebral infarction - Plan: Ot plan of care cert/re-cert  Apraxia - Plan: Ot plan of care cert/re-cert  Other symptoms and signs involving the musculoskeletal system - Plan: Ot plan of care cert/re-cert    Problem List There are no active problems to display for this  patient.   Norton Pastel, OTR/L 05/14/2018, 4:59 PM  [facility] [address] [place], [place], [address] Phone: [phone]   Fax:  [phone]  Name: TALBERT TREMBATH MRN: [id number] Date of Birth: [birth year]

## 2018-05-16 ENCOUNTER — Encounter: Payer: Self-pay | Admitting: Occupational Therapy

## 2018-05-16 ENCOUNTER — Ambulatory Visit: Payer: BLUE CROSS/BLUE SHIELD | Admitting: Occupational Therapy

## 2018-05-16 DIAGNOSIS — M6281 Muscle weakness (generalized): Secondary | ICD-10-CM

## 2018-05-16 DIAGNOSIS — R482 Apraxia: Secondary | ICD-10-CM

## 2018-05-16 DIAGNOSIS — I69318 Other symptoms and signs involving cognitive functions following cerebral infarction: Secondary | ICD-10-CM

## 2018-05-16 DIAGNOSIS — R2681 Unsteadiness on feet: Secondary | ICD-10-CM

## 2018-05-16 DIAGNOSIS — R293 Abnormal posture: Secondary | ICD-10-CM

## 2018-05-16 DIAGNOSIS — I69351 Hemiplegia and hemiparesis following cerebral infarction affecting right dominant side: Secondary | ICD-10-CM

## 2018-05-16 DIAGNOSIS — R29818 Other symptoms and signs involving the nervous system: Secondary | ICD-10-CM

## 2018-05-16 DIAGNOSIS — R29898 Other symptoms and signs involving the musculoskeletal system: Secondary | ICD-10-CM

## 2018-05-16 NOTE — Therapy (Signed)
[facility] [address] [place], [place], [address] Phone: [phone]   Fax:  [phone]  Occupational Therapy Treatment  Patient Details  Name: Tim Stephens MRN: [id number] Date of Birth: 11/20/1955 No data recorded  Encounter Date: 05/16/2018  OT End of Session - 05/16/18 1649    Visit Number  28    Number of Visits  30    Date for OT Re-Evaluation  06/11/18    Authorization Type  BCBS    Authorization Time Period  pt has been approved for 8 additional visits by 06/11/2018 (30 total)    Authorization - Visit Number  28    Authorization - Number of Visits  30    OT Start Time  1447    OT Stop Time  1530    OT Time Calculation (min)  43 min    Activity Tolerance  Patient tolerated treatment well    Behavior During Therapy  [facility] for tasks assessed/performed       History reviewed. No pertinent past medical history.  History reviewed. No pertinent surgical history.  There were no vitals filed for this visit.  Subjective Assessment - 05/16/18 1451    Subjective   Patient indicates that he is working on dressing    Patient is accompained by:  Family member    Pertinent History  MONITOR BP!!!  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  No/denies    Pain Score  0-No pain                   OT Treatments/Exercises (OP) - 05/16/18 0001      Neurological Re-education Exercises   Other Exercises 1  Neuromuscular reeducation to address reach patterns in supine.  Place and hold of RUE - first static holding, then moving between mid ranges of flex/ext, horiz abd/add.  Patient initially pushed downward with UE but with cueing and visual feedback, able to sustain more balance muscle control.  Patient even able to maintain  humeral position at 90 degrees of flexion and isolate elbow flexion and extension without support, able to internally / extrenally rotate arm, and flex extend wrist.       Other Exercises 2  Had patient transition to quadruped, to further address strength - proximal stability in right upper extremity.  Patient with limited ability to accept weight on extended wrist in this position, and had difficulty with stretch toward ulnar aspect of hand.  Patient with joint laxity in right wrist, may benefit from wrist support.        Splinting   Splinting  Initiated wrist support to prevent subluxation of carpals.  Patient is now walking as primary mode of mobility - and uses walker splint.  Patient may be limited in wrist splint options with walker - will assess next week.               OT Education - 05/16/18 1649    Education provided  Yes    Education Details  Wrist support to protect carpals    Person(s) Educated  Patient;Spouse    Methods  Explanation;Demonstration    Comprehension  Need further instruction       OT Short Term Goals - 05/14/18 1411      OT SHORT  TERM GOAL #1   Title  Pt and wife will be mod I with HEP for RUE ROM - 01/30/2018 (renewed)  (Pended)     Status  Achieved  (Pended)       OT SHORT TERM GOAL #2   Title  Pt will be min a for grooming with mod cues from wife and intermittent set up prn  (Pended)     Status  Achieved  (Pended)       OT SHORT TERM GOAL #3   Title  Pt will be mod a for UB bathing at wheelchair level with mod cues  (Pended)     Status  Achieved  (Pended)       OT SHORT TERM GOAL #4   Title  Pt will be mod a for LB bathing at sit to stand level  with mod cues and set up  (Pended)     Status  Achieved  (Pended)       OT SHORT TERM GOAL #5   Title  Pt will be max a for UB dressing at wheelchair level, mod cues  (Pended)     Status  Achieved  (Pended)       OT SHORT TERM GOAL #6   Title  Pt and wife will explore options for 3 in 1 commode for  pt to use in bathroom or at bedside  (Pended)     Status  Achieved  (Pended)       OT SHORT TERM GOAL #7   Title  Pt will use RUE as stabilizer 25% of the time during basic self care skills with min a and cues.   (Pended)     Status  Achieved  (Pended)       OT SHORT TERM GOAL #8   Title  Pt will be supervion for toilet transfers - 03/29/2018  (Pended)     Status  Achieved  (Pended)       OT SHORT TERM GOAL  #9   TITLE  Pt will tolerate functional activity at ambulatory level for 5 minutes with no rest breaks  (Pended)     Status  Achieved  (Pended)         OT Long Term Goals - 05/14/18 1410      OT LONG TERM GOAL #1   Title  Pt and wife will be mod I with home activity program designed to increase independence in basic self care as well as address cognition -06/11/2018 renewed on 05/14/2018)    Status  On-going   05/14/2018 in process of upgrading HEP for RUE     OT LONG TERM GOAL #2   Title  Pt will be min a for UB bathing after set up with mod cues    Status  Achieved      OT LONG TERM GOAL #3   Title  Pt will be min a for UB dressing with mod cues    Status  Achieved      OT LONG TERM GOAL #4   Title  Pt will be min a for LB bathing with mod cues    Status  Achieved      OT LONG TERM GOAL #6   Title  Pt will be min a for toilet transfers in bathroom (able to ambulate 3 steps into bathroom with wife).      Status  Achieved      OT LONG TERM GOAL #7   Title  Pt will be mod a  for clothing mmgt with toileting    Status  Achieved      OT LONG TERM GOAL #8   Title  Pt will demonstrate ability to use RUE as stabilizer during basic self care 50% of the time with min a and cues.     Status  On-going            Plan - 05/16/18 1650    Clinical Impression Statement  Patient continues to show improvement in his active movement in his RUE, and he and his wife report improved performance with ADL's at home.      Occupational Profile and client history currently impacting  functional performance  HLD, HTN, s/p craniotomy, IVC filter with Coumadin, seizures.    Occupational performance deficits (Please refer to evaluation for details):  ADL's;IADL's;Rest and Sleep;Work;Leisure;Social Participation    Rehab Potential  Good    Current Impairments/barriers affecting progress:  severity of deficits    OT Frequency  2x / week    OT Duration  8 weeks    OT Treatment/Interventions  Self-care/ADL training;Moist Heat;Therapeutic exercise;Neuromuscular education;DME and/or AE instruction;Manual Therapy;Functional Mobility Training;Passive range of motion;Therapeutic activities;Splinting;Patient/family education;Balance training    Plan  Check out right wrist splint - is it reasonable - can he use with walker?  , RUE NMR, Functional use of RUE, upgrade HEP as possible    Clinical Decision Making  Multiple treatment options, significant modification of task necessary    Consulted and Agree with Plan of Care  Patient;Family member/caregiver    Family Member Consulted  [name]       Patient will benefit from skilled therapeutic intervention in order to improve the following deficits and impairments:  Abnormal gait, Decreased activity tolerance, Decreased balance, Decreased cognition, Decreased knowledge of use of DME, Decreased mobility, Decreased range of motion, Decreased safety awareness, Difficulty walking, Decreased strength, Impaired UE functional use, Impaired tone, Impaired sensation, Pain  Visit Diagnosis: Muscle weakness (generalized)  Unsteadiness on feet  Abnormal posture  Hemiplegia and hemiparesis following cerebral infarction affecting right dominant side (HCC)  Other symptoms and signs involving the nervous system  Other symptoms and signs involving cognitive functions following cerebral infarction  Apraxia  Other symptoms and signs involving the musculoskeletal system    Problem List There are no active problems to display for this  patient.   Collier Salina, OTR/L 05/16/2018, 4:53 PM  [facility] [address] [place], [place], [address] Phone: [phone]   Fax:  [phone]  Name: KEIGHAN AMEZCUA MRN: [id number] Date of Birth: 1956/04/29

## 2018-05-21 ENCOUNTER — Encounter: Payer: Self-pay | Admitting: Occupational Therapy

## 2018-05-21 ENCOUNTER — Ambulatory Visit: Payer: BLUE CROSS/BLUE SHIELD | Admitting: Occupational Therapy

## 2018-05-21 DIAGNOSIS — R2681 Unsteadiness on feet: Secondary | ICD-10-CM

## 2018-05-21 DIAGNOSIS — I69351 Hemiplegia and hemiparesis following cerebral infarction affecting right dominant side: Secondary | ICD-10-CM

## 2018-05-21 DIAGNOSIS — M6281 Muscle weakness (generalized): Secondary | ICD-10-CM | POA: Diagnosis not present

## 2018-05-21 DIAGNOSIS — R482 Apraxia: Secondary | ICD-10-CM

## 2018-05-21 DIAGNOSIS — R29818 Other symptoms and signs involving the nervous system: Secondary | ICD-10-CM

## 2018-05-21 DIAGNOSIS — R293 Abnormal posture: Secondary | ICD-10-CM

## 2018-05-21 DIAGNOSIS — R29898 Other symptoms and signs involving the musculoskeletal system: Secondary | ICD-10-CM

## 2018-05-21 DIAGNOSIS — I69318 Other symptoms and signs involving cognitive functions following cerebral infarction: Secondary | ICD-10-CM

## 2018-05-21 NOTE — Therapy (Signed)
[facility] [address] [place], [place], [address] Phone: [phone]   Fax:  [phone]  Occupational Therapy Treatment  Patient Details  Name: Tim Stephens MRN: [id number] Date of Birth: 1955-11-14 No data recorded  Encounter Date: 05/21/2018  OT End of Session - 05/21/18 1624    Visit Number  29    Number of Visits  30    Date for OT Re-Evaluation  06/11/18    Authorization Type  BCBS    Authorization Time Period  pt has been approved for 8 additional visits by 06/11/2018 (30 total)    Authorization - Visit Number  29    Authorization - Number of Visits  30    OT Start Time  1316    OT Stop Time  1400    OT Time Calculation (min)  44 min    Activity Tolerance  Patient tolerated treatment well       History reviewed. No pertinent past medical history.  History reviewed. No pertinent surgical history.  There were no vitals filed for this visit.  Subjective Assessment - 05/21/18 1338    Subjective   He has a rash on his right leg (see note)    Patient is accompained by:  Family member   wife   Pertinent History  MONITOR BP!!!  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  No/denies                   OT Treatments/Exercises (OP) - 05/21/18 0001      ADLs   ADL Comments  Wife arrived today and stated pt had red area on front of RLE (shin).  Upon examination, red area looks to be a rash and wife report it appears to be spreading.  This would not be caused by AFO as pt has posterior upright. Pt does wear compression hose daily. Encouraged wife to contact MD today and follow up with him. Wife verbalized understanding and agreement. Also discussed pros and cons of R wrist splint to protect carpal alignment.  Pt is  unable to use RW with splint and is very mobile a this time.  Pt has 3 ring D splint for RUE and recommended that pt wear this in the afternoon when resting and anytime that he will be sitting for longer periods of time.  Insructed pt and wife in [place] for wrist flexion and extension in gravity eliminated positon. Pt and wife able to return demonstrate.       Neurological Re-education Exercises   Other Exercises 1  Neuro re ed to address RLE control in functional ambulation - pt issuesd small wedge in shoe to assist with hyperextension of R knee and wife to monitor skin and tolerance.  Also addressed use of PVC square for home use - demonstated chest presses as well as bilateral overhead reach.  Wife also practiced with pt but will need further practice and instruction next session. Wife understands that this will be done in conjunction to current HEP.  Pt has one visit left this year and then wishes to return in January.               OT Education - 05/21/18 1618    Education provided  Yes    Education  Details  Initiated instruction for use of PVC square at home for UE re education (for HEP)    Person(s) Educated  Patient;Spouse    Methods  Explanation;Demonstration;Tactile cues;Verbal cues    Comprehension  Verbalized understanding;Returned demonstration;Need further instruction       OT Short Term Goals - 05/21/18 1622      OT SHORT TERM GOAL #1   Title  Pt and wife will be mod I with HEP for RUE ROM - 01/30/2018 (renewed)    Status  Achieved      OT SHORT TERM GOAL #2   Title  Pt will be min a for grooming with mod cues from wife and intermittent set up prn    Status  Achieved      OT SHORT TERM GOAL #3   Title  Pt will be mod a for UB bathing at wheelchair level with mod cues    Status  Achieved      OT SHORT TERM GOAL #4   Title  Pt will be mod a for LB bathing at sit to stand level  with mod cues and set up    Status  Achieved      OT SHORT TERM GOAL #5   Title  Pt will  be max a for UB dressing at wheelchair level, mod cues    Status  Achieved      OT SHORT TERM GOAL #6   Title  Pt and wife will explore options for 3 in 1 commode for pt to use in bathroom or at bedside    Status  Achieved      OT SHORT TERM GOAL #7   Title  Pt will use RUE as stabilizer 25% of the time during basic self care skills with min a and cues.     Status  Achieved      OT SHORT TERM GOAL #8   Title  Pt will be supervion for toilet transfers - 03/29/2018    Status  Achieved      OT SHORT TERM GOAL  #9   TITLE  Pt will tolerate functional activity at ambulatory level for 5 minutes with no rest breaks    Status  Achieved        OT Long Term Goals - 05/21/18 1622      OT LONG TERM GOAL #1   Title  Pt and wife will be mod I with home activity program designed to increase independence in basic self care as well as address cognition -06/11/2018 renewed on 05/14/2018)    Status  On-going   05/14/2018 in process of upgrading HEP for RUE     OT LONG TERM GOAL #2   Title  Pt will be min a for UB bathing after set up with mod cues    Status  Achieved      OT LONG TERM GOAL #3   Title  Pt will be min a for UB dressing with mod cues    Status  Achieved      OT LONG TERM GOAL #4   Title  Pt will be min a for LB bathing with mod cues    Status  Achieved      OT LONG TERM GOAL #5   Title  Pt will be min a for LB dressing with mod cues    Status  Achieved      OT LONG TERM GOAL #6   Title  Pt will be min a for toilet  transfers in bathroom (able to ambulate 3 steps into bathroom with wife).      Status  Achieved      OT LONG TERM GOAL #7   Title  Pt will be mod a for clothing mmgt with toileting    Status  Achieved      OT LONG TERM GOAL #8   Title  Pt will demonstrate ability to use RUE as stabilizer during basic self care 50% of the time with min a and cues.     Status  On-going            Plan - 05/21/18 1622    Clinical Impression Statement  Pt continues to  demonstrate improvement in functional mobility as well as isolated movement of RUE.      Occupational Profile and client history currently impacting functional performance  HLD, HTN, s/p craniotomy, IVC filter with Coumadin, seizures.    Occupational performance deficits (Please refer to evaluation for details):  ADL's;IADL's;Rest and Sleep;Work;Leisure;Social Participation    Rehab Potential  Good    Current Impairments/barriers affecting progress:  severity of deficits    OT Frequency  2x / week    OT Duration  8 weeks    OT Treatment/Interventions  Self-care/ADL training;Moist Heat;Therapeutic exercise;Neuromuscular education;DME and/or AE instruction;Manual Therapy;Functional Mobility Training;Passive range of motion;Therapeutic activities;Splinting;Patient/family education;Balance training    Plan  Complete education for HEP with PVC square, provide handout as well as reinforce wrist flexion/extension exercises, and seated exercises with dowel.  Place on hold until january (pt will then have more visits available for OT)    Consulted and Agree with Plan of Care  Patient;Family member/caregiver    Family Member Consulted  [name]       Patient will benefit from skilled therapeutic intervention in order to improve the following deficits and impairments:     Visit Diagnosis: Muscle weakness (generalized)  Unsteadiness on feet  Abnormal posture  Hemiplegia and hemiparesis following cerebral infarction affecting right dominant side (HCC)  Other symptoms and signs involving the nervous system  Other symptoms and signs involving cognitive functions following cerebral infarction  Apraxia  Other symptoms and signs involving the musculoskeletal system    Problem List There are no active problems to display for this patient.   Norton Pastel, OTR/L 05/21/2018, 4:26 PM  [facility] [address] [place], [place], [address] Phone: [phone]   Fax:  [phone]  Name: Tim Stephens MRN: [id number] Date of Birth: 12/09/1955

## 2018-05-23 ENCOUNTER — Encounter: Payer: Self-pay | Admitting: Occupational Therapy

## 2018-05-23 ENCOUNTER — Ambulatory Visit: Payer: BLUE CROSS/BLUE SHIELD | Admitting: Occupational Therapy

## 2018-05-23 DIAGNOSIS — I69351 Hemiplegia and hemiparesis following cerebral infarction affecting right dominant side: Secondary | ICD-10-CM

## 2018-05-23 DIAGNOSIS — I69318 Other symptoms and signs involving cognitive functions following cerebral infarction: Secondary | ICD-10-CM

## 2018-05-23 DIAGNOSIS — R29818 Other symptoms and signs involving the nervous system: Secondary | ICD-10-CM

## 2018-05-23 DIAGNOSIS — R293 Abnormal posture: Secondary | ICD-10-CM

## 2018-05-23 DIAGNOSIS — M6281 Muscle weakness (generalized): Secondary | ICD-10-CM

## 2018-05-23 DIAGNOSIS — R2681 Unsteadiness on feet: Secondary | ICD-10-CM

## 2018-05-23 DIAGNOSIS — R482 Apraxia: Secondary | ICD-10-CM

## 2018-05-23 NOTE — Patient Instructions (Addendum)
Lying on your back in bed and using the PVC frame.   Tim Stephens will help hold right hand onto frame initially.   1)  Chest press - press both hands up from chest toward ceiling.  Work to maintain straight elbows, and keep hands reaching toward ceiling.  5-10 reps  2)  Place and Hold - press frame up toward ceiling at chin level. Hold that position emphasizing straight elbows, active arms, BREATHE!    3)  From position #2, move hands gently - small range backward toward forehead then back to chin.    In seated position:  Tim Stephens seated beside on right side on couch or bed. Tim Stephens supporting right forearm - thumb pointing upward (wrist neutral) work on extending wrist (touch back of knuckles to her finger - target)  More important to work on extending wrist than flexing forward.    In seated position - use can for support of right hand and work to push cane forward and back.  Moving arm not body.  Side to side, circle clockwise and counter clockwise.

## 2018-05-23 NOTE — Therapy (Signed)
[facility] [address] [place], [place], [address] Phone: [phone]   Fax:  [phone]  Occupational Therapy Treatment  Patient Details  Name: Tim Stephens MRN: [id number] Date of Birth: 07-09-1956 No data recorded  Encounter Date: 05/23/2018  OT End of Session - 05/23/18 1619    Visit Number  30    Number of Visits  30    Date for OT Re-Evaluation  06/11/18    Authorization Type  BCBS    Authorization Time Period  pt has been approved for 8 additional visits by 06/11/2018 (30 total)    Authorization - Visit Number  30    Authorization - Number of Visits  30    OT Start Time  1448    OT Stop Time  1531    OT Time Calculation (min)  43 min    Activity Tolerance  Patient tolerated treatment well       History reviewed. No pertinent past medical history.  History reviewed. No pertinent surgical history.  There were no vitals filed for this visit.  Subjective Assessment - 05/23/18 1454    Subjective   last visit until new year    Patient is accompained by:  Family member    Pertinent History  MONITOR BP!!!  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  No/denies    Pain Score  0-No pain                   OT Treatments/Exercises (OP) - 05/23/18 0001      Neurological Re-education Exercises   Other Exercises 1  Finalized HEP with patient and wife to address right UE activation.  See aptient instructions.  Patient will be palced on hold for therapy until January 2020.               OT Education - 05/23/18 1618    Education provided  Yes    Education Details  HEP RUE    Person(s) Educated  Patient;Spouse    Methods  Explanation;Demonstration;Tactile cues;Verbal cues;Handout    Comprehension   Verbalized understanding;Returned demonstration       OT Short Term Goals - 05/21/18 1622      OT SHORT TERM GOAL #1   Title  Pt and wife will be mod I with HEP for RUE ROM - 01/30/2018 (renewed)    Status  Achieved      OT SHORT TERM GOAL #2   Title  Pt will be min a for grooming with mod cues from wife and intermittent set up prn    Status  Achieved      OT SHORT TERM GOAL #3   Title  Pt will be mod a for UB bathing at wheelchair level with mod cues    Status  Achieved      OT SHORT TERM GOAL #4   Title  Pt will be mod a for LB bathing at sit to stand level  with mod cues and set up    Status  Achieved      OT SHORT TERM GOAL #5   Title  Pt will be max a for UB dressing at wheelchair level, mod cues    Status  Achieved      OT SHORT TERM GOAL #  6   Title  Pt and wife will explore options for 3 in 1 commode for pt to use in bathroom or at bedside    Status  Achieved      OT SHORT TERM GOAL #7   Title  Pt will use RUE as stabilizer 25% of the time during basic self care skills with min a and cues.     Status  Achieved      OT SHORT TERM GOAL #8   Title  Pt will be supervion for toilet transfers - 03/29/2018    Status  Achieved      OT SHORT TERM GOAL  #9   TITLE  Pt will tolerate functional activity at ambulatory level for 5 minutes with no rest breaks    Status  Achieved        OT Long Term Goals - 05/23/18 1621      OT LONG TERM GOAL #1   Title  Pt and wife will be mod I with home activity program designed to increase independence in basic self care as well as address cognition -06/11/2018 renewed on 05/14/2018)    Status  On-going      OT LONG TERM GOAL #2   Title  Pt will be min a for UB bathing after set up with mod cues    Status  Achieved      OT LONG TERM GOAL #3   Title  Pt will be min a for UB dressing with mod cues    Status  Achieved      OT LONG TERM GOAL #4   Title  Pt will be min a for LB bathing with mod cues    Status  Achieved      OT LONG  TERM GOAL #5   Title  Pt will be min a for LB dressing with mod cues    Status  Achieved      OT LONG TERM GOAL #6   Title  Pt will be min a for toilet transfers in bathroom (able to ambulate 3 steps into bathroom with wife).      Status  Achieved      OT LONG TERM GOAL #7   Title  Pt will be mod a for clothing mmgt with toileting    Status  Achieved      OT LONG TERM GOAL #8   Title  Pt will demonstrate ability to use RUE as stabilizer during basic self care 50% of the time with min a and cues.     Status  On-going            Plan - 05/23/18 1619    Clinical Impression Statement  Patient has made steady improvement in functional mobility, ADL independence, and active movement and control in RUE.  Patient will benefit from additional OT in the new year 2020 to further address functional use of RUE, and further improve independence with ADL/IADL.  Patient and wife agreeable to holding further OT until [place].      Occupational Profile and client history currently impacting functional performance  HLD, HTN, s/p craniotomy, IVC filter with Coumadin, seizures.    Occupational performance deficits (Please refer to evaluation for details):  ADL's;IADL's;Rest and Sleep;Work;Leisure;Social Participation    Rehab Potential  Good    Current Impairments/barriers affecting progress:  severity of deficits    OT Frequency  2x / week    OT Duration  8 weeks    OT Treatment/Interventions  Self-care/ADL training;Moist  Heat;Therapeutic exercise;Neuromuscular education;DME and/or AE instruction;Manual Therapy;Functional Mobility Training;Passive range of motion;Therapeutic activities;Splinting;Patient/family education;Balance training    Plan  place on hold until 2020    Clinical Decision Making  Multiple treatment options, significant modification of task necessary    Consulted and Agree with Plan of Care  Patient;Family member/caregiver    Family Member Consulted  [name]       Patient  will benefit from skilled therapeutic intervention in order to improve the following deficits and impairments:  Abnormal gait, Decreased activity tolerance, Decreased balance, Decreased cognition, Decreased knowledge of use of DME, Decreased mobility, Decreased range of motion, Decreased safety awareness, Difficulty walking, Decreased strength, Impaired UE functional use, Impaired tone, Impaired sensation, Pain  Visit Diagnosis: Muscle weakness (generalized)  Hemiplegia and hemiparesis following 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   Collier Salina, OTR/L 05/23/2018, 4:23 PM  [facility] [address] [place], [place], [address] Phone: [phone]   Fax:  [phone]  Name: Tim Stephens MRN: [id number] Date of Birth: 05-13-1956

## 2018-05-28 ENCOUNTER — Encounter: Payer: BLUE CROSS/BLUE SHIELD | Admitting: Occupational Therapy

## 2018-05-30 ENCOUNTER — Encounter: Payer: BLUE CROSS/BLUE SHIELD | Admitting: Occupational Therapy

## 2018-07-16 ENCOUNTER — Encounter: Payer: BLUE CROSS/BLUE SHIELD | Admitting: Occupational Therapy

## 2018-07-18 ENCOUNTER — Ambulatory Visit: Payer: BLUE CROSS/BLUE SHIELD | Admitting: Occupational Therapy

## 2018-07-19 ENCOUNTER — Encounter: Payer: BLUE CROSS/BLUE SHIELD | Admitting: Occupational Therapy

## 2018-07-23 ENCOUNTER — Encounter: Payer: BLUE CROSS/BLUE SHIELD | Admitting: Occupational Therapy

## 2018-07-26 ENCOUNTER — Encounter: Payer: BLUE CROSS/BLUE SHIELD | Admitting: Occupational Therapy

## 2018-07-30 ENCOUNTER — Encounter: Payer: BLUE CROSS/BLUE SHIELD | Admitting: Occupational Therapy

## 2018-08-02 ENCOUNTER — Encounter: Payer: BLUE CROSS/BLUE SHIELD | Admitting: Occupational Therapy

## 2018-08-06 ENCOUNTER — Encounter: Payer: BLUE CROSS/BLUE SHIELD | Admitting: Occupational Therapy

## 2018-08-09 ENCOUNTER — Encounter: Payer: BLUE CROSS/BLUE SHIELD | Admitting: Occupational Therapy

## 2019-01-18 IMAGING — CT CT HEAD W/O CM
3 series · 15 of 47 positions shown, 18 images · non-contrast
Comparison: None.

CLINICAL DATA: 61 y/o M; history of hemorrhagic stroke. Seizure
with postictal emesis.

EXAM:
CT HEAD WITHOUT CONTRAST
TECHNIQUE: Contiguous axial images were obtained from the base of the skull
through the vertex without intravenous contrast.

[Series 3: head 5.0 h30s · axial · 0.47mm/px · z∈[-117,+58]mm · 9 of 41 slices shown, 12 images]
[im 3/41  brain]
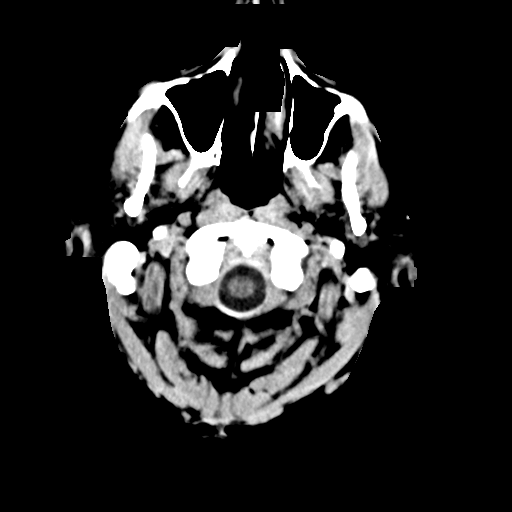
[im 3/41  bone]
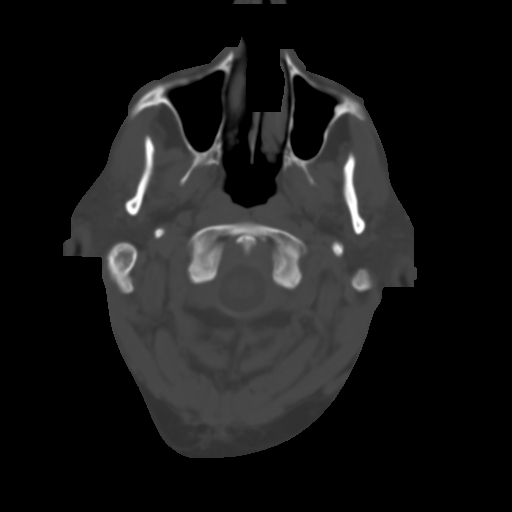
[im 7/41  brain]
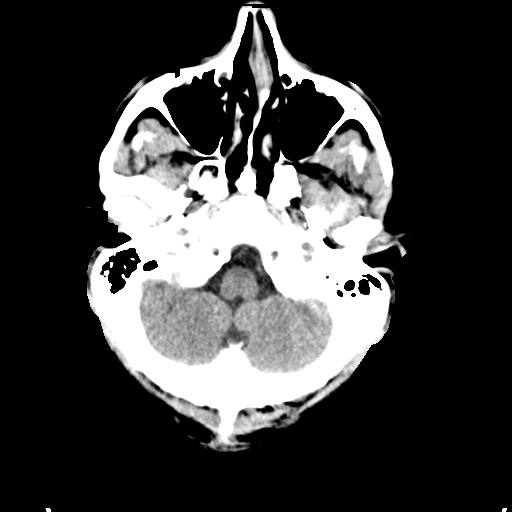
[im 12/41  brain]
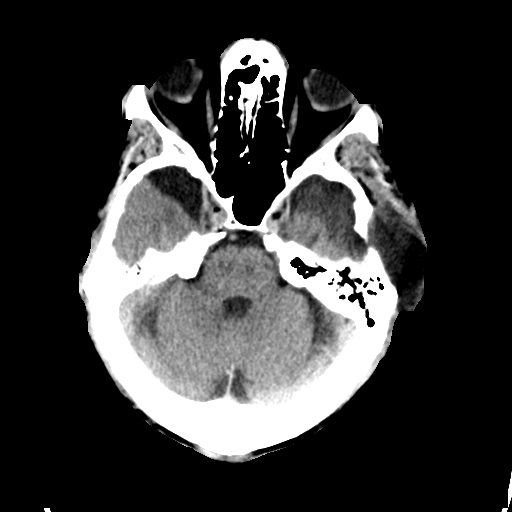
[im 16/41  brain]
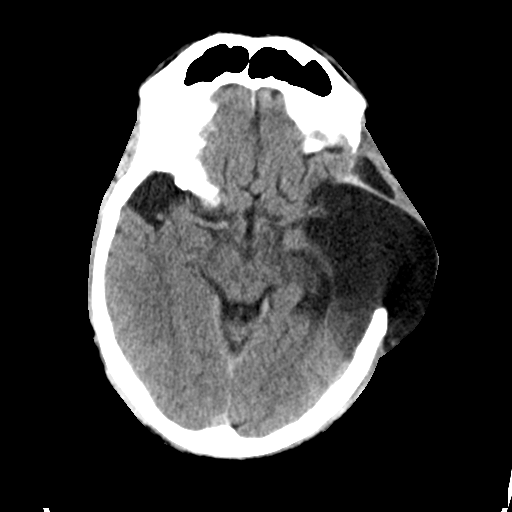
[im 21/41  brain]
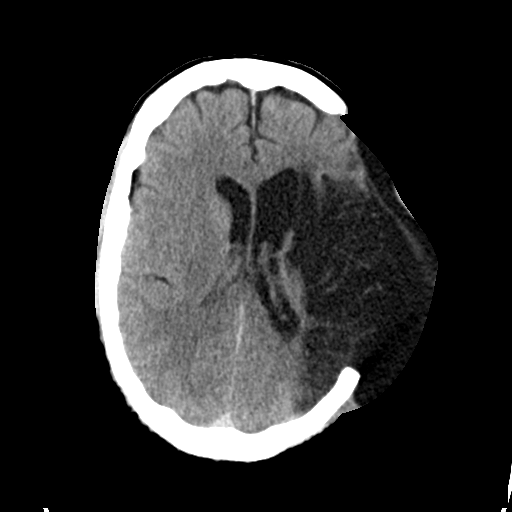
[im 21/41  bone]
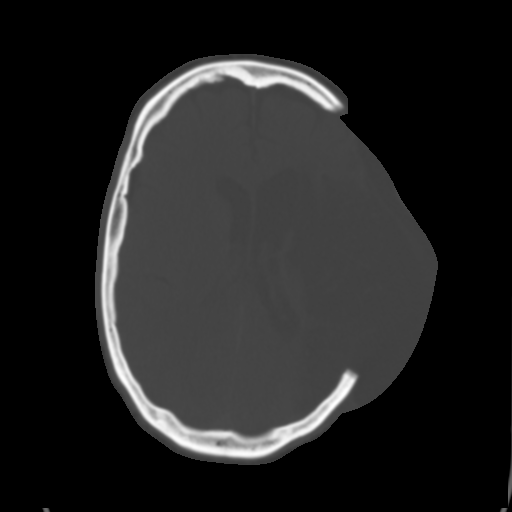
[im 25/41  brain]
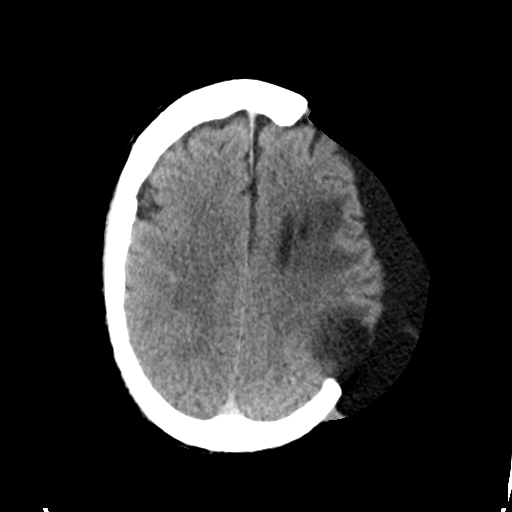
[im 29/41  brain]
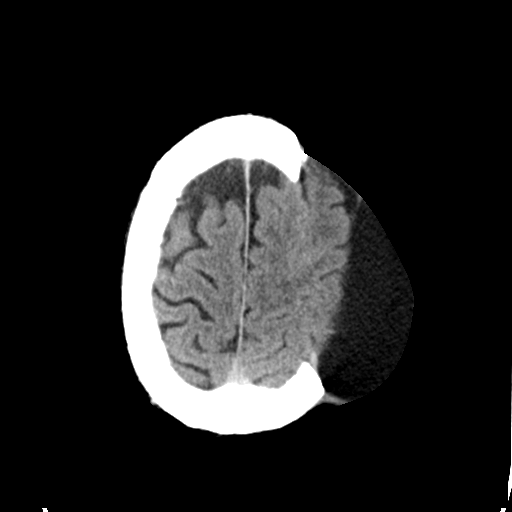
[im 34/41  brain]
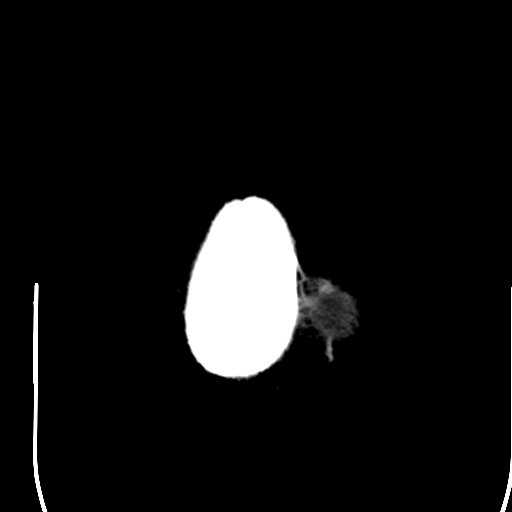
[im 38/41  brain]
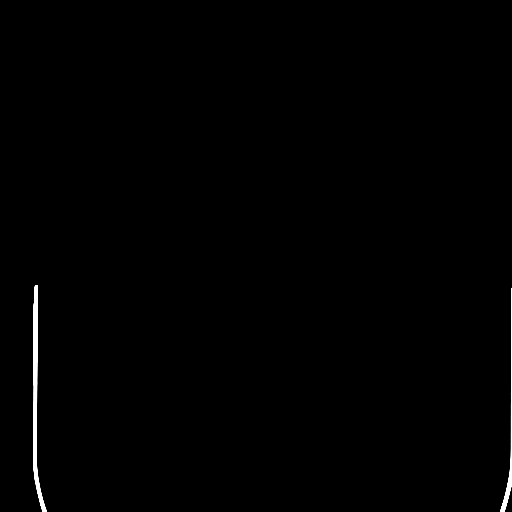
[im 38/41  bone]
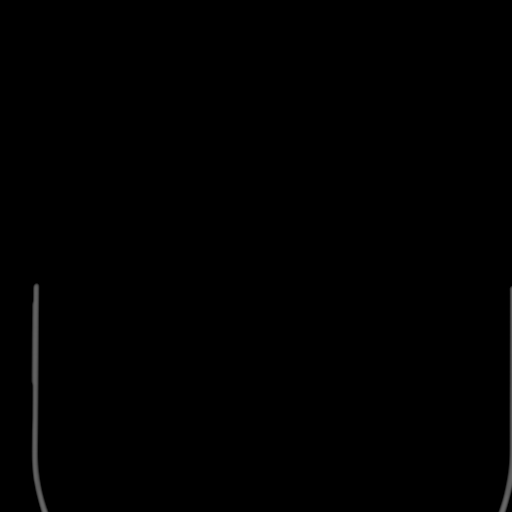

[Series 5: head 3.0 mpr cor · coronal · 0.36mm/px · 3 of 78 slices shown]
[im 26/78  brain]
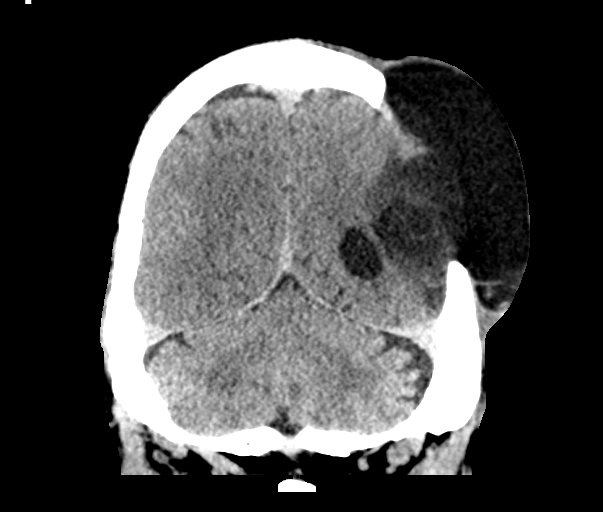
[im 35/78  brain]
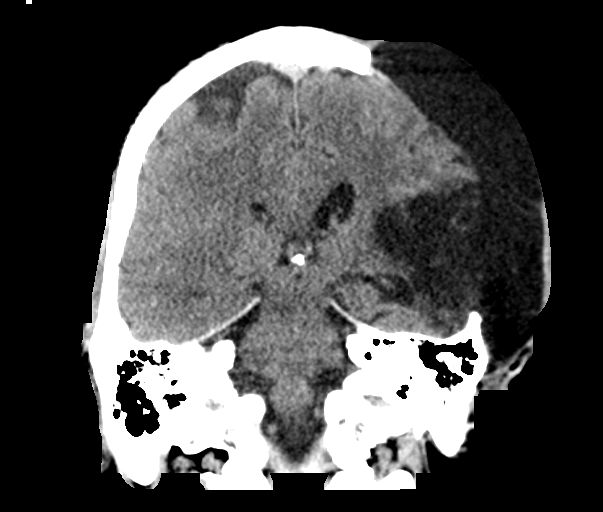
[im 43/78  brain]
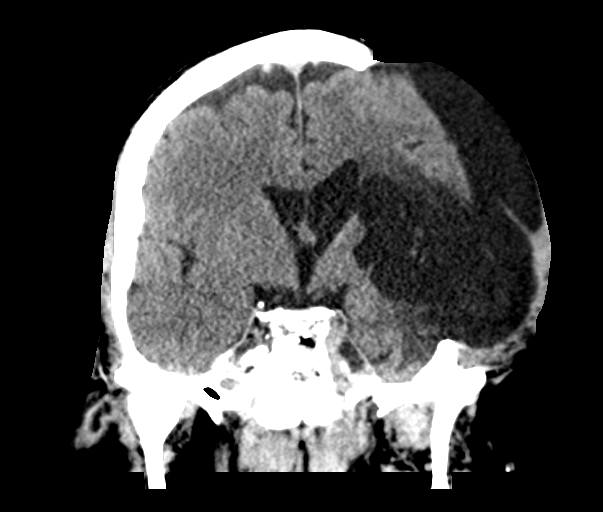

[Series 6: head 3.0 mpr sag · sagittal · 0.38mm/px · 3 of 67 slices shown]
[im 23/67  brain]
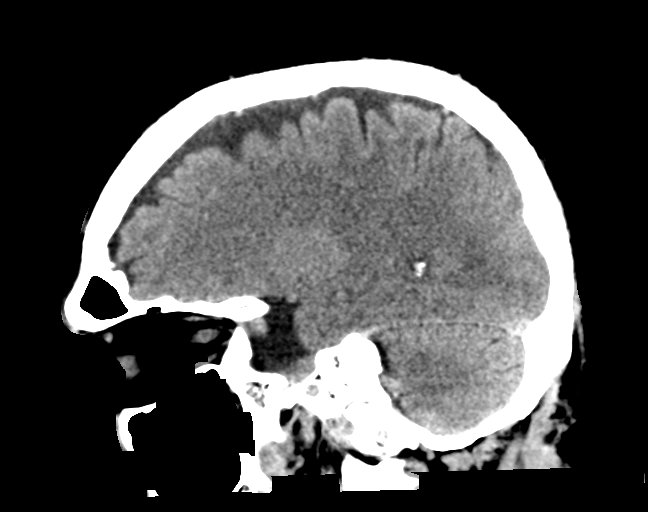
[im 34/67  brain]
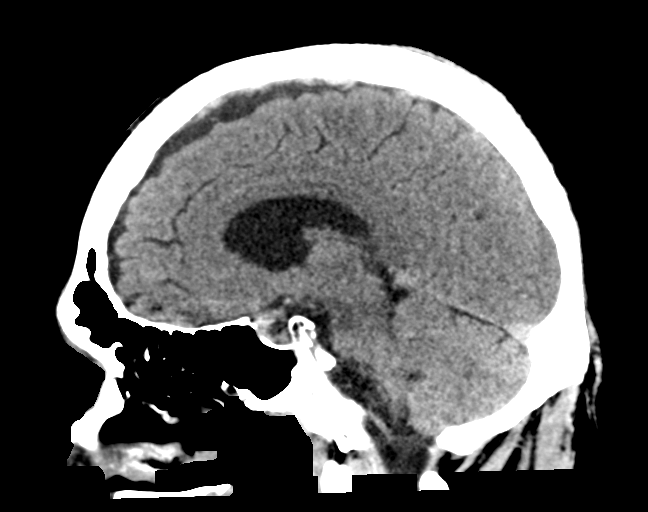
[im 45/67  brain]
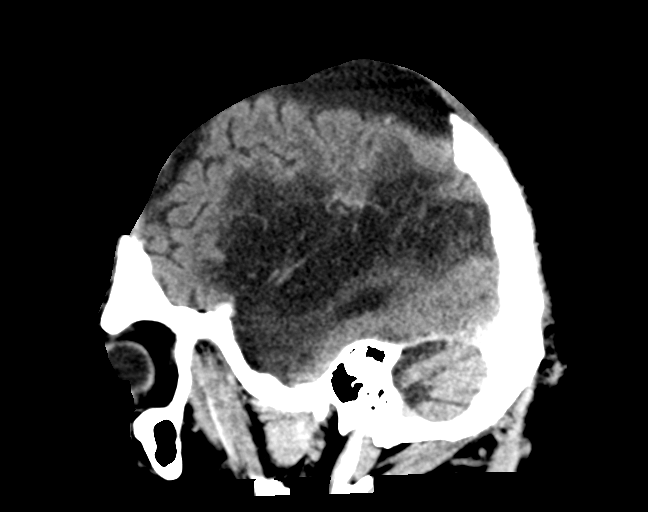

[15 of 47 positions shown; findings below may reference images not displayed]

FINDINGS: Brain: Large left MCA distribution chronic infarction involving
insula, temporal lobe, frontal operculum, and lentiform nucleus.
Mild herniation of infarcted brain through a left convexity
craniectomy. There is a fluid attenuation extra-axial collection
with a few septations overlying the brain herniated through the
craniectomy probably representing associated hygroma/chronic
hematoma. No acute stroke, hemorrhage, or focal mass effect
identified. No hydrocephalus or effacement of basilar cisterns.

Vascular: No hyperdense vessel or unexpected calcification.

Skull: Normal. Negative for fracture or focal lesion.

Sinuses/Orbits: No acute finding.

Other: None.
IMPRESSION: 1. No acute stroke, hemorrhage, or focal mass effect.
2. Large left MCA distribution chronic infarction, mild herniation
of infarcted brain through large left craniectomy defect, and fluid
attenuation extra-axial collection overlying herniated brain which,
probably hygroma/chronic hematoma.

By: Myeisha Payamps M.D.

## 2019-08-28 IMAGING — US US ART/VEN ABD/PELV/SCROTUM DOPPLER LTD
1 series · 13 of 25 positions shown · non-contrast
Comparison: None.

CLINICAL DATA: 61 y/o M; genital pain and right testicular pain for
2 days.

EXAM:
SCROTAL ULTRASOUND
DOPPLER ULTRASOUND OF THE TESTICLES
TECHNIQUE: Complete ultrasound examination of the testicles, epididymis, and
other scrotal structures was performed. Color and spectral Doppler
ultrasound were also utilized to evaluate blood flow to the
testicles.

[Series 1: us art/ven abd/pelv/scrotum doppler ltd · 0.06mm/px · 13 of 70 slices shown]
[im 1/70]
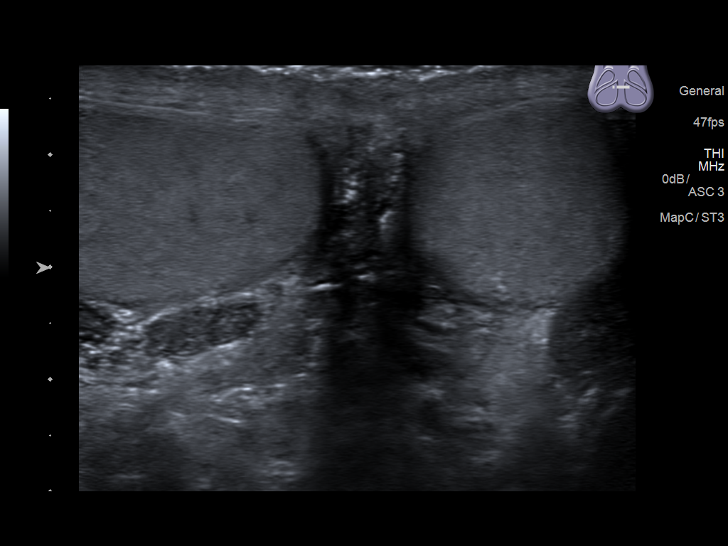
[im 6/70]
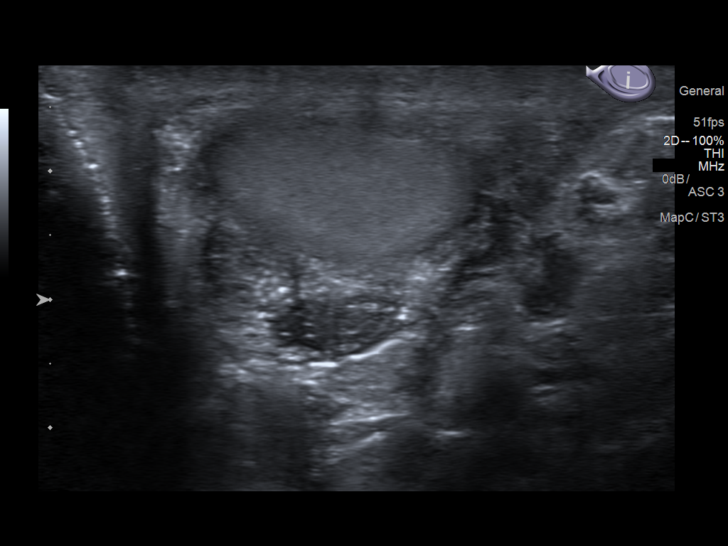
[im 12/70]
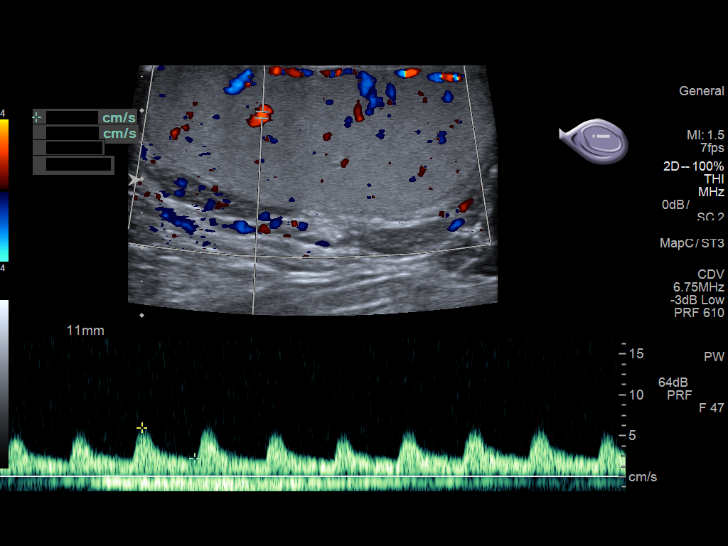
[im 18/70]
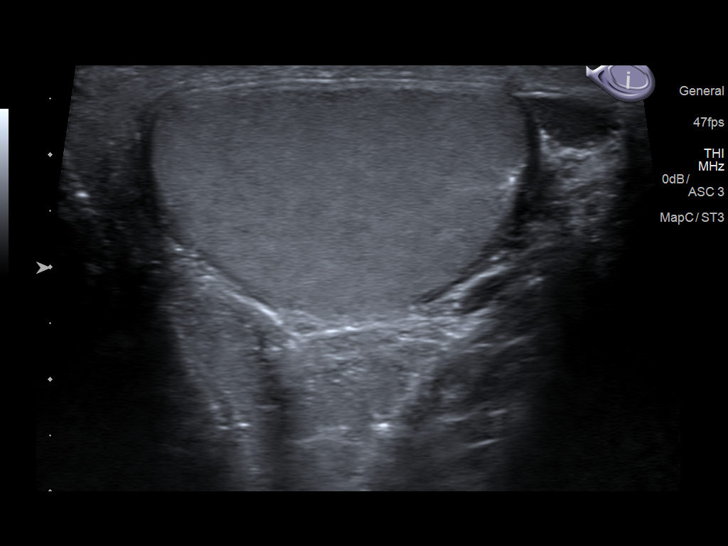
[im 24/70]
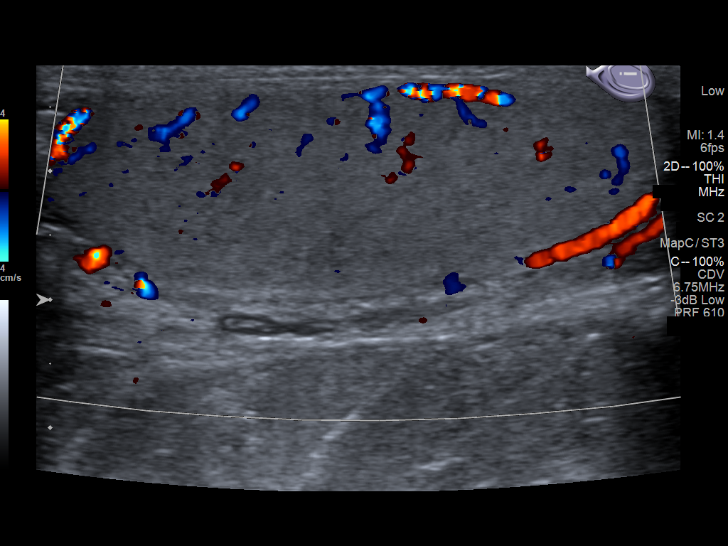
[im 29/70]
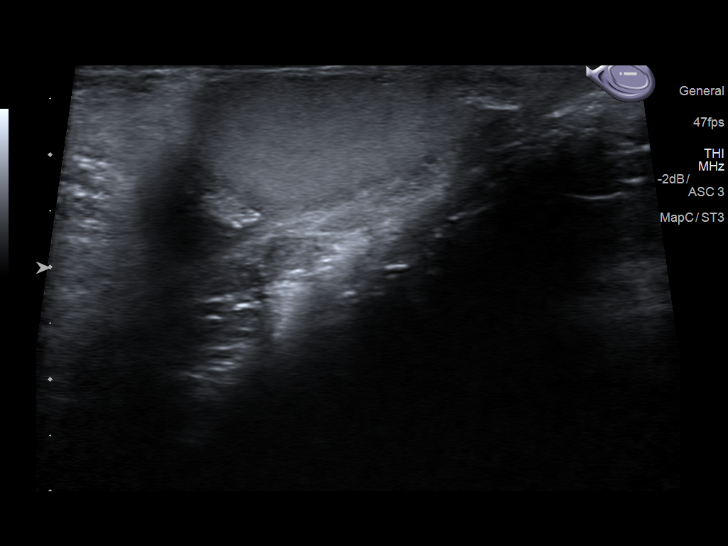
[im 35/70]
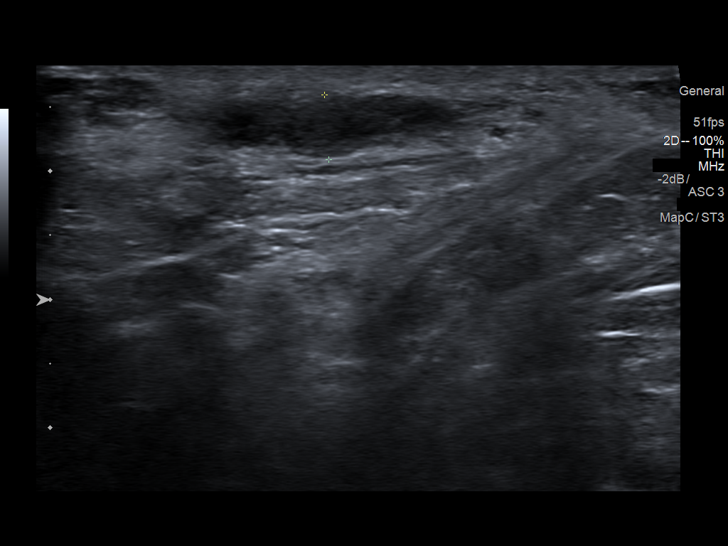
[im 41/70]
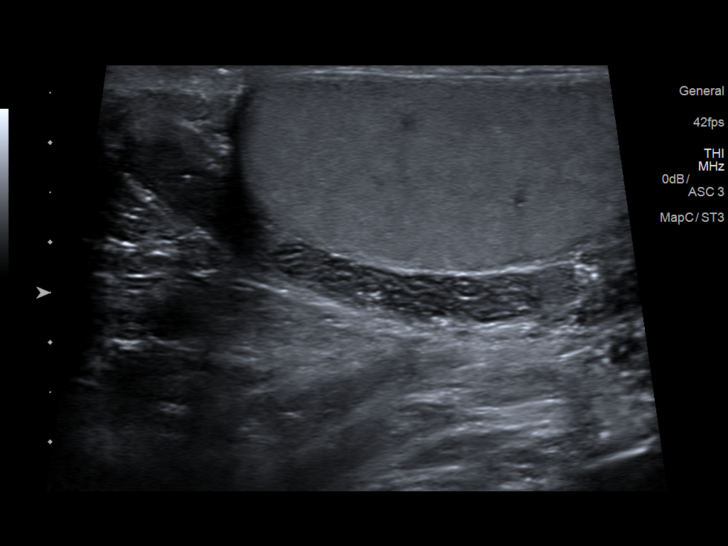
[im 47/70]
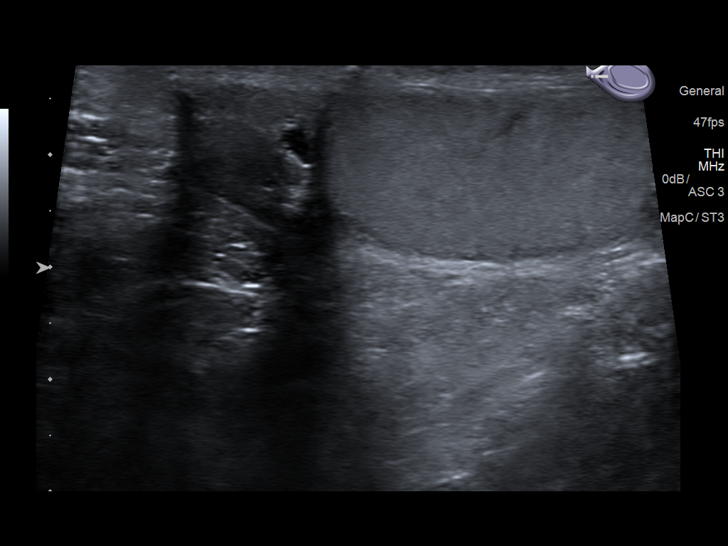
[im 52/70]
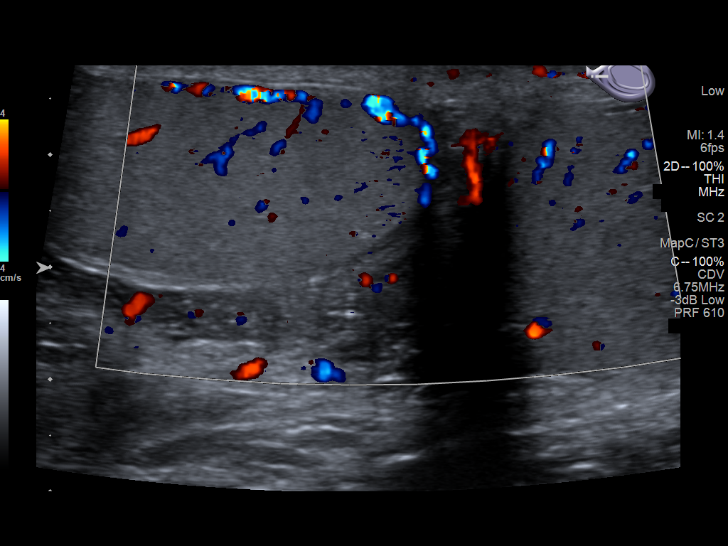
[im 58/70]
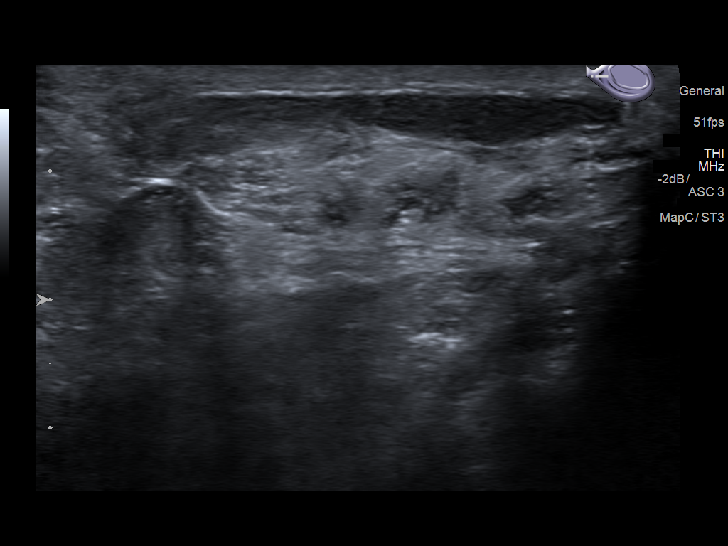
[im 64/70]
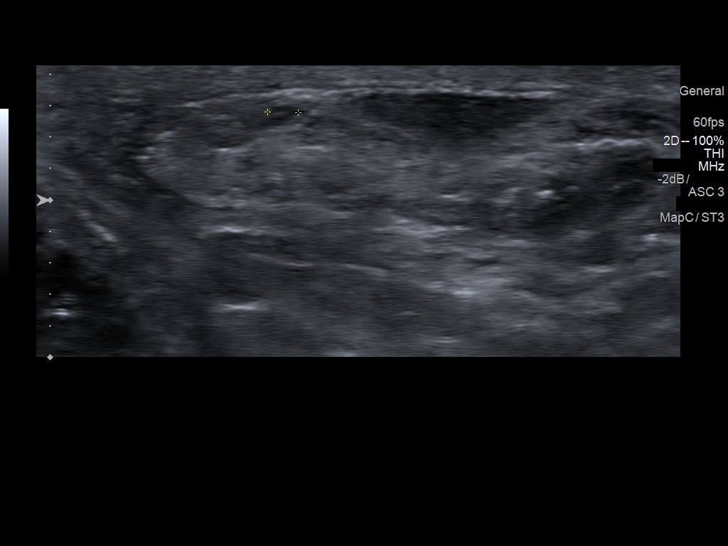
[im 70/70]
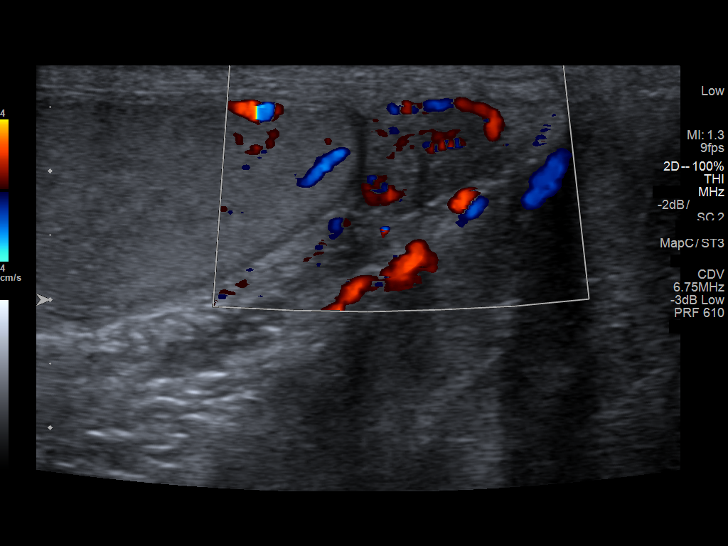

[13 of 25 positions shown; findings below may reference images not displayed]

FINDINGS: Right testicle

Measurements: 5.3 x 2.1 x 3.5 cm. No mass or microlithiasis
visualized.

Left testicle

Measurements: 5.3 x 1.9 x 3.3 cm. No mass or microlithiasis
visualized. Tubular structure lateral to the testicle without
internal blood flow, likely vas deferens, incidentally noted.

Right epididymis: Heterogeneity and enlargement of the tail with
increased vascularity. Simple cyst measuring up to 4 mm.

Left epididymis: Normal in size and appearance. Simple cyst
measuring up to 2 mm.

Hydrocele:  Trace left hydrocele.

Varicocele:  None.

Pulsed Doppler interrogation of both testes demonstrates normal low
resistance arterial and venous waveforms bilaterally.
IMPRESSION: 1. No findings of testicular torsion or orchitis at this time.
2. Heterogeneity and enlargement of the tail of right epididymis
with increased blood flow, likely epididymitis.

By: Kaki [name] M.D.

## 2020-05-06 ENCOUNTER — Ambulatory Visit: Payer: Medicare Other | Attending: Physical Medicine & Rehabilitation | Admitting: Speech Pathology

## 2020-05-06 ENCOUNTER — Encounter: Payer: Self-pay | Admitting: Speech Pathology

## 2020-05-06 ENCOUNTER — Other Ambulatory Visit: Payer: Self-pay

## 2020-05-06 DIAGNOSIS — R482 Apraxia: Secondary | ICD-10-CM | POA: Diagnosis present

## 2020-05-06 DIAGNOSIS — R4701 Aphasia: Secondary | ICD-10-CM | POA: Insufficient documentation

## 2020-05-06 NOTE — Patient Instructions (Addendum)
     [email or web address] (Jessica) [email or web address] (Sena)  Aphasia does not affect intelligence, only language. The person with aphasia can still: make decisions, have opinions, and socialize.   Many Ways to Communicate  Describe it Write it Draw it Gesture it Use related words  Resources  Club Aphasia of [place] with Dr. Cynda Familia at [facility] - email [email or web address]  TalkPath Therapy app by Sherron Flemings on [facility] website National Aphasia Association - naa.aphasia.org Aphasia Recovery Connection - aphasiarecoveryconnection.org Tactus therapy apps Constant Therapy  Watch: Patience Listening and Communicating with Aphasia Patients on YouTube BrowserReview.ca   Provided by: Vanna Scotland, [phone]    Local Driver Evaluation Programs:  Comprehensive Evaluation: includes clinical and in vehicle behind the wheel testing by OCCUPATIONAL THERAPIST. Programs have varying levels of adaptive controls available for trial.   [employer], [place] [address] [place], [place]  [address] [phone] or [phone] http://www.driver-rehab.com Evaluator:  Elvera Lennox, OT/CDRS/CDI/SCDCM/Low Vision Certification  [facility] [address] [place], [place] [address] [phone] [email or web address] Evaluators:  Rockwell Alexandria, OT and Yves Dill, OT  [facility] [facility]able) [facility] (ONLY SERVES VETERANS!!) [facility] [address] [place], [place]  [address] [phone] [email or web address] Evaluators:  Randall Hiss, KT; Rayburn Felt, KT;  Sheran Luz, KT (KT=kiniesotherapist)   Clinical evaluations only:  Includes clinical testing, refers to other programs or local certified driving instructor for  behind the wheel testing.  [facility] at [facility] (outpatient Rehab) Medical Okey Dupre [address] [place], [place] [address] [phone] for scheduling [email or web address] Evaluators:  Steward Drone, OT; Delma Post, OT  Other area clinical evaluators available upon request including [facility], [facility] and [facility].       Resource List What is a Industrial/product designer: Your Road Ahead - A Guide to [employer] Evaluations http://www.thehartford.com/resources/mature-market-excellence/publications-on-aging  Association for Academic librarian - Disability and Driving Fact Sheets http://www.aded.net/?page=510  Driving after a Brain Injury: Brain Injury Association of America VCShow.co.za?A=SearchResult&SearchID=9495675&ObjectID=2758842&ObjectType=35  Driving with Adaptive Equipment: Gaffer Association https://aguirre-arnold.org/

## 2020-05-06 NOTE — Therapy (Signed)
[facility] [address] [place], [place], [address] Phone: [phone]   Fax:  [phone]  Speech Language Pathology Evaluation  Patient Details  Name: Tim Stephens MRN: [id number] Date of Birth: Oct 20, 1955 Referring Provider (SLP): Dr. Renato Gails   Encounter Date: 05/06/2020   End of Session - 05/06/20 1029    Visit Number 1    Number of Visits 25    Date for SLP Re-Evaluation 07/29/20    Authorization Type none    SLP Start Time 0845    SLP Stop Time  0939    SLP Time Calculation (min) 54 min    Activity Tolerance Patient tolerated treatment well           History reviewed. No pertinent past medical history.  History reviewed. No pertinent surgical history.  There were no vitals filed for this visit.   Subjective Assessment - 05/06/20 1004    Subjective "We went to [employer]"    Currently in Pain? No/denies              SLP Evaluation [facility] - 05/06/20 6962      SLP Visit Information   SLP Received On 05/06/20    Referring Provider (SLP) Dr. Renato Gails    Onset Date 2018    Medical Diagnosis remote CVA      Subjective   Patient/Family Stated Goal "Everything"      General Information   HPI Tim Stephens is know to [place] from prior course of ST following CVA 2018. Tim Stephens developed aphasia and right weakness May 30, 2017 He was found to have a large left sided MCA stroke. He had a complicated course. 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  [name] received ST in CIR and outpt ST 2018-2019. He has a Architectural technologist.   Mobility Status walks with cane      Balance Screen   Has the patient fallen in the past 6 months No    Has the patient had a decrease in activity level because of a fear of falling?  No    Is  the patient reluctant to leave their home because of a fear of falling?  No      Prior Functional Status   Cognitive/Linguistic Baseline Within functional limits   prior to CVA 2018; aphasia since CVA    Type of Home House     Lives With Spouse    Available Support Family      Cognition   Overall Cognitive Status Impaired/Different from baseline    Area of Impairment Attention;Following commands;Problem solving      Auditory Comprehension   Overall Auditory Comprehension Impaired    Yes/No Questions Impaired    Basic Immediate Environment Questions 75-100% accurate    Complex Questions 50-74% accurate    Commands Impaired    One Step Basic Commands 75-100% accurate    Two Step Basic Commands 50-74% accurate   multistep commands 0-24%   Conversation Simple    Interfering Components Motor planning;Processing speed;Hearing    EffectiveTechniques Extra processing time;Pausing;Repetition;Slowed speech;Stressing words;Visual/Gestural cues      Reading Comprehension   Reading Status Impaired    Word level 76-100% accurate    Sentence Level 51-75% accurate    Paragraph Level 0-25% accurate    Functional Environmental (signs, name badge) Impaired  Interfering Components Processing time    Effective Techniques Verbal cueing      Expression   Primary Mode of Expression Verbal      Verbal Expression   Overall Verbal Expression Impaired    Initiation Impaired    Level of Generative/Spontaneous Verbalization Word    Repetition Impaired    Level of Impairment Word level   multisyllabic word   Naming Impairment    Responsive 0-25% accurate    Confrontation 0-24% accurate    Convergent Not tested    Divergent 0-24% accurate    Verbal Errors Phonemic paraphasias;Jargon    Pragmatics No impairment    Effective Techniques Phonemic cues;Written cues;Articulatory cues    Non-Verbal Means of Communication Communication board   not using consistently     Written Expression   Dominant  Hand Right    Written Expression Not tested      Oral Motor/Sensory Function   Overall Oral Motor/Sensory Function Appears within functional limits for tasks assessed      Motor Speech   Overall Motor Speech Impaired    Respiration Within functional limits    Phonation Normal    Resonance Within functional limits    Articulation Impaired    Level of Impairment Word    Motor Planning Impaired    Level of Impairment Word    Motor Speech Errors Aware    Effective Techniques Slow rate;Pause   placement cues     Standardized Assessments   Standardized Assessments  Western Aphasia Battery revised    Western Aphasia Battery revised  27/100 - severe                           SLP Education - 05/06/20 1027    Education Details driving eval info, use of aphasia ID card, call [place] [facility] 911, pre-plan and practice phrases or orders, use written cues    Person(s) Educated Patient;Spouse    Methods Explanation;Demonstration;Verbal cues            SLP Short Term Goals - 05/06/20 1038      SLP SHORT TERM GOAL #1   Title Tim Stephens will ID 5 phrases he would like to use regularly and use visual and written cues to approximate these phrases with 75% intelligibility and usual min A    Time 4    Period Weeks    Status New      SLP SHORT TERM GOAL #2   Title Tim Stephens will use external aids to respond to 3 texts or Facebook posts with pre scripted phrases/comments with rare min A    Time 4    Period Weeks    Status New      SLP SHORT TERM GOAL #3   Title Family and friends will use written cues (1-2 words) to facilitate auditory comprehension, resulting in 25% less communication breakdowns or requiring spouse assistance.    Time 4    Period Weeks    Status New      SLP SHORT TERM GOAL #4   Title Pt will use multimodal communication to answer 5 personally relevant open ended questions with usual mod A over 2 sessions    Time 4    Period Weeks    Status New             SLP Long Term Goals - 05/06/20 1043      SLP LONG TERM GOAL #1   Title Pt will use text to  speech on smart phone to comprehend 2 sentence text or Face Book comment/post 3x over 3 sessions with usual min A    Time 12    Period Weeks    Status New      SLP LONG TERM GOAL #2   Title Pt will generate 6 personally relevant phrases he would like to use on a regular basis to be 75% intelligible and carryover these phrases in 4 different situations or different people with occasional min A, including written cues    Time 12    Period Weeks    Status New      SLP LONG TERM GOAL #3   Title Pt will use multimodal AAC to particiate in 4 turns in conversation using 2-4 words using external aids, with friend with usual min A    Time 12    Period Weeks    Status New      SLP LONG TERM GOAL #4   Title Tim AshingJim will use external aids to respond to 5 texts with 2-4 word phrases with occasional min A    Time 12    Period Weeks    Status New      SLP LONG TERM GOAL #5   Title Friends/family will use written, visual cues to A [name] in generating verbal responses to conversation/questions/orders in community settings 5x with rare min A            Plan - 05/06/20 1030    Clinical Impression Statement Fayrene FearingJames "Tim AshingJim" Frutoso SchatzKress is referred for outpt ST due to ongoing aphasia resulting in poor communication. Tim AshingJim is well known to use from prior course of ST 2019 after CVA. 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 Tim AshingJim is accompanied by his spouse, Shanda BumpsJessica, whom he looks to for all communication attempts and answers. Shanda BumpsJessica states that Tim AshingJim wants her by his side all the time, which is difficult for them as she has returned to work. He will call her from her work (at home) to assist him with phone calls, disrupting Jessica's job. At this time, Tim AshingJim is  not using gestures or any attempt at writing to augment his communication. He has a SGD which is uses "occasionally." Tim AshingJim is ordering at restaurants by pointing and he is using emojis to respond to Face Book and texts. A reading screen revealed reading comprension of 1 step commands relatively intact, however 2 step (2 phrases) impaired. At this time, I recommend skilled ST targeting use of compensations for aphasia, including multi modal communication for  both expressive and receptive language to improve independent communication in community and with friends, as well as reduce caregiver's burden.    Speech Therapy Frequency 2x / week    Duration --   12 weeks or 25 visits   Treatment/Interventions Language facilitation;Environmental controls;Cueing hierarchy;SLP instruction and feedback;Compensatory strategies;Functional tasks;Cognitive reorganization;Internal/external aids;Multimodal communcation approach;Patient/family education;Other (comment);Compensatory techniques    Potential to Achieve Goals Good    Potential Considerations Severity of impairments    Consulted and Agree with Plan of Care Patient;Family member/caregiver    Family Member Consulted spouse, Shanda BumpsJessica           Patient will benefit from skilled therapeutic intervention in order to improve the following deficits and impairments:   Aphasia  Verbal apraxia    Problem List There are no problems to display for this patient.  Didier Brandenburg, Radene Journey MS, CCC-SLP 05/06/2020, 10:51 AM  [facility] [address] [place], [place], [address] Phone: [phone]   Fax:  [phone]  Name: CLAYBORN MILNES MRN: [id number] Date of Birth: Feb 25, 1956

## 2020-05-15 ENCOUNTER — Ambulatory Visit: Payer: Medicare Other | Attending: Physical Medicine & Rehabilitation

## 2020-05-15 ENCOUNTER — Other Ambulatory Visit: Payer: Self-pay

## 2020-05-15 DIAGNOSIS — R482 Apraxia: Secondary | ICD-10-CM | POA: Diagnosis present

## 2020-05-15 DIAGNOSIS — R4701 Aphasia: Secondary | ICD-10-CM | POA: Diagnosis present

## 2020-05-16 NOTE — Therapy (Signed)
[facility] [address] [place], [place], [address] Phone: [phone]   Fax:  [phone]  Speech Language Pathology Treatment  Patient Details  Name: CADIN LUKA MRN: [id number] Date of Birth: 25-Dec-1955 Referring Provider (SLP): Dr. Renato Gails   Encounter Date: 05/15/2020   End of Session - 05/16/20 1350    Visit Number 2    Number of Visits 25    Date for SLP Re-Evaluation 07/29/20    Authorization Type none    SLP Start Time 1450    SLP Stop Time  1532    SLP Time Calculation (min) 42 min    Activity Tolerance Patient tolerated treatment well           No past medical history on file.  No past surgical history on file.  There were no vitals filed for this visit.   Subjective Assessment - 05/15/20 1455    Subjective "(jibberish)"    Patient is accompained by: --   Friend   Currently in Pain? No/denies                 ADULT SLP TREATMENT - 05/16/20 0001      General Information   Behavior/Cognition Alert;Cooperative;Pleasant mood;Impulsive;Requires cueing      Treatment Provided   Treatment provided Cognitive-Linquistic      Cognitive-Linquistic Treatment   Treatment focused on Patient/family/caregiver education;Aphasia;Apraxia    Skilled Treatment Pt did not attempt to use device when SLP asked simple question about meal choice for lunch - instead, [name] attempted to tell SLP verblaly with 3-4 attempts. When SLP retrieved pt's device for him he communicated with extra time, successfully. Pt device today was at 15% battery - SLP used until 7% and then got clinic-issued device to use for remainder of session. Encouraged pt/friend to have deivce charged every night. See "pt education" for majority of what was discussed for today's session. SLP demonstrated to [name]'s friend that when [name] uses device he can repeat a single word spoken by the device to augment his verbal communication and that  practical/functional practice in therapy was pt's best chance for cont'd success rather than focus on specific phonemes (pt's friend inquired whether specific sound drill would be helpful for pt in therapy). [name] had 0% success imitating device-speech of greater than 1 syllable with max cues by SLP. However productions, repeated, of device-spoken 1-syllable words were improved over his attempts at spontaneous productions which were undetectable/jibberish 90% of the time. 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 SLP ?s [name]'s awareness of his errors- only ~70% of the time today did [name] make additional attempts at verbalization.     Assessment / Recommendations / Plan   Plan Continue with current plan of care      Progression Toward Goals   Progression toward goals Progressing toward goals            SLP Education - 05/16/20 1348    Education Details (for friend) writing keywords can assist pt comprehension, using device can help [name] to communicate verbally by attempting to repeat the device-spoken word, [name] should use device daily, practice on individual sounds at this point will likely not be beneficial, apraxia vs. aphasia    Person(s) Educated Patient;Caregiver(s)    Methods Explanation;Demonstration    Comprehension Verbalized understanding  SLP Short Term Goals - 05/16/20 1401      SLP SHORT TERM GOAL #1   Title [name] will ID 5 phrases he would like to use regularly and use visual and written cues to approximate these phrases with 75% intelligibility and usual min A    Time 4    Period Weeks    Status On-going      SLP SHORT TERM GOAL #2   Title [name] will use external aids to respond to 3 texts or Facebook posts with pre scripted phrases/comments with rare min A    Time 4    Period Weeks    Status On-going      SLP SHORT TERM GOAL #3    Title Family and friends will use written cues (1-2 words) to facilitate auditory comprehension, resulting in 25% less communication breakdowns or requiring spouse assistance.    Time 4    Period Weeks    Status On-going      SLP SHORT TERM GOAL #4   Title Pt will use multimodal communication to answer 5 personally relevant open ended questions with usual mod A over 2 sessions    Time 4    Period Weeks    Status On-going            SLP Long Term Goals - 05/16/20 1402      SLP LONG TERM GOAL #1   Title Pt will use text to speech on smart phone to comprehend 2 sentence text or Face Book comment/post 3x over 3 sessions with usual min A    Time 12    Period Weeks    Status On-going      SLP LONG TERM GOAL #2   Title Pt will generate 6 personally relevant phrases he would like to use on a regular basis to be 75% intelligible and carryover these phrases in 4 different situations or different people with occasional min A, including written cues    Time 12    Period Weeks    Status On-going      SLP LONG TERM GOAL #3   Title Pt will use multimodal AAC to particiate in 4 turns in conversation using 2-4 words using external aids, with friend with usual min A    Time 12    Period Weeks    Status On-going      SLP LONG TERM GOAL #4   Title [name] will use external aids to respond to 5 texts with 2-4 word phrases with occasional min A    Time 12    Period Weeks    Status On-going      SLP LONG TERM GOAL #5   Title Friends/family will use written, visual cues to A [name] in generating verbal responses to conversation/questions/orders in community settings 5x with rare min A            Plan - 05/16/20 1357    Clinical Impression Statement Fayrene Fearing "[name]" Gunderson is referred for outpt ST due to ongoing aphasia resulting in poor communication. 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  At this time, Today, [name] successfully used gestures and intonational patterns x2 to augment his very limited verbal communication in high emotion topics/utterances. His SGD was at 15% today - SLP asked this to be charged daily, and told friend that [name] should be using  this device daily. See "dkilled intervention" for more details. [name] demonstrated dec'd comprehension of simple multistep questioning requireing SLP to use key words - when this was done [name] demonstrated improved understanding of audioty message. Recommended cont'd skilled ST targeting use of compensations for aphasia, including multi modal communication for  both expressive and receptive language to improve independent communication in community and with friends, as well as reduce caregiver's burden.    Speech Therapy Frequency 2x / week    Duration --   12 weeks or 25 visits   Treatment/Interventions Language facilitation;Environmental controls;Cueing hierarchy;SLP instruction and feedback;Compensatory strategies;Functional tasks;Cognitive reorganization;Internal/external aids;Multimodal communcation approach;Patient/family education;Other (comment);Compensatory techniques    Potential to Achieve Goals Good    Potential Considerations Severity of impairments    Consulted and Agree with Plan of Care Patient;Family member/caregiver    Family Member Consulted spouse, [name]           Patient will benefit from skilled therapeutic intervention in order to improve the following deficits and impairments:   Aphasia  Verbal apraxia    Problem List There are no problems to display for this patient.   [facility] 05/16/2020, 2:05 PM  [facility] [address] [place], [place], [address] Phone: [phone]   Fax:  [phone]   Name: MASSON NALEPA MRN: [id number] Date of Birth: 01-09-1956

## 2020-05-16 NOTE — Patient Instructions (Signed)
See "pt education"

## 2020-05-20 ENCOUNTER — Other Ambulatory Visit: Payer: Self-pay

## 2020-05-20 ENCOUNTER — Ambulatory Visit: Payer: Medicare Other | Admitting: Speech Pathology

## 2020-05-20 DIAGNOSIS — R4701 Aphasia: Secondary | ICD-10-CM | POA: Diagnosis not present

## 2020-05-20 DIAGNOSIS — R482 Apraxia: Secondary | ICD-10-CM

## 2020-05-20 NOTE — Therapy (Signed)
[facility] [address] [place], [place], [address] Phone: [phone]   Fax:  [phone]  Speech Language Pathology Treatment  Patient Details  Name: Tim Stephens MRN: [id number] Date of Birth: [birth year] Referring Provider (SLP): Dr. Renato Gails   Encounter Date: 05/20/2020   End of Session - 05/20/20 1531    Visit Number 3    Number of Visits 25    Date for SLP Re-Evaluation 07/29/20    Authorization Type none    SLP Start Time 1403    SLP Stop Time  1448    SLP Time Calculation (min) 45 min    Activity Tolerance Patient tolerated treatment well           No past medical history on file.  No past surgical history on file.  There were no vitals filed for this visit.   Subjective Assessment - 05/20/20 1419    Subjective Neologisms    Patient is accompained by: Family member   step daughter, Tim Stephens   Currently in Pain? No/denies                 ADULT SLP TREATMENT - 05/20/20 1501      General Information   Behavior/Cognition Alert;Cooperative;Pleasant mood;Impulsive;Requires cueing      Treatment Provided   Treatment provided Cognitive-Linquistic      Cognitive-Linquistic Treatment   Treatment focused on Aphasia;Apraxia;Patient/family/caregiver education    Skilled Treatment Pt forgot SGD. Arrives with step daughter. Modeled and trained daughter in use of written words and choices to audment Tim Stephens's auditory comprehension. Tim Stephens is responding to texts with emoji's. I installed text to speech on his phone to augment reading comprehension for texts. He demonstrated this with occasional min A. Tim Stephens verbalized understanding. Generated 2 stores that Tim Stephens would shop at or online,  to add to his device or note section of his phone. Tim Stephens accurately copied the store name into search box to pull up [employer]. Tim Stephens indicated he would like to return to hunting. Locate [employer] and  provided information for Tim Stephens and his family to research at home. Tim Stephens utilized [employer] to augment verbal approximations to answer personally relevant questions with usual min to mod A. Encouraged Tim Stephens to have white board at home out and available for family and Tim Stephens to use.       Assessment / Recommendations / Plan   Plan Continue with current plan of care      Progression Toward Goals   Progression toward goals Progressing toward goals              SLP Short Term Goals - 05/20/20 1530      SLP SHORT TERM GOAL #1   Title Tim Stephens will ID 5 phrases he would like to use regularly and use visual and written cues to approximate these phrases with 75% intelligibility and usual min A    Time 3    Period Weeks    Status On-going      SLP SHORT TERM GOAL #2   Title Tim Stephens will use external aids to respond to 3 texts or Facebook posts with pre scripted phrases/comments with rare min A    Time 3    Period Weeks    Status On-going      SLP SHORT TERM GOAL #3   Title Family and friends will use written cues (1-2 words) to facilitate auditory comprehension, resulting in 25% less communication breakdowns or requiring spouse assistance.  Time 3    Period Weeks    Status On-going      SLP SHORT TERM GOAL #4   Title Pt will use multimodal communication to answer 5 personally relevant open ended questions with usual mod A over 2 sessions    Time 3    Period Weeks    Status On-going            SLP Long Term Goals - 05/20/20 1531      SLP LONG TERM GOAL #1   Title Pt will use text to speech on smart phone to comprehend 2 sentence text or Face Book comment/post 3x over 3 sessions with usual min A    Time 11    Period Weeks    Status On-going      SLP LONG TERM GOAL #2   Title Pt will generate 6 personally relevant phrases he would like to use on a regular basis to be 75% intelligible and carryover these phrases in 4 different situations or different people with occasional  min A, including written cues    Time 11    Period Weeks    Status On-going      SLP LONG TERM GOAL #3   Title Pt will use multimodal AAC to particiate in 4 turns in conversation using 2-4 words using external aids, with friend with usual min A    Time 11    Period Weeks    Status On-going      SLP LONG TERM GOAL #4   Title Tim Stephens will use external aids to respond to 5 texts with 2-4 word phrases with occasional min A    Time 11    Period Weeks    Status On-going      SLP LONG TERM GOAL #5   Title Friends/family will use written, visual cues to A Tim Stephens in generating verbal responses to conversation/questions/orders in community settings 5x with rare min A            Plan - 05/20/20 1526    Clinical Impression Statement Chronic severe aphasia and verbal apraxia.Tim Stephens and his caregivers require ongoing speech therapy to maximize multimodal communication and partner training. Tim Stephens has experienced isolation due to Covid, aphasia and physical disability. He endorses depression. ST to maximize communication to improve life particiation, including texting and replying on social media.    Speech Therapy Frequency 2x / week    Duration --   12 weeks or 25 visits   Treatment/Interventions Language facilitation;Environmental controls;Cueing hierarchy;SLP instruction and feedback;Compensatory strategies;Functional tasks;Cognitive reorganization;Internal/external aids;Multimodal communcation approach;Patient/family education;Other (comment);Compensatory techniques    Potential to Achieve Goals Good    Potential Considerations Severity of impairments           Patient will benefit from skilled therapeutic intervention in order to improve the following deficits and impairments:   Aphasia  Verbal apraxia    Problem List There are no problems to display for this patient.   Esthela Brandner, Radene Journey MS, CCC-SLP 05/20/2020, 3:32 PM  [facility] [address] [place], [place], [address] Phone: [phone]   Fax:  [phone]   Name: Tim Stephens MRN: [id number] Date of Birth: 12-04-1955

## 2020-05-20 NOTE — Patient Instructions (Addendum)
   Bring in you device  Bring in list of 5 stores  [employer]  [employer]  [employer]  5 text responses that you might say  2 Food orders ([employer])  [email or web address] for disabled fishing and hunting

## 2020-05-22 ENCOUNTER — Ambulatory Visit: Payer: Medicare Other

## 2020-05-22 ENCOUNTER — Other Ambulatory Visit: Payer: Self-pay

## 2020-05-22 DIAGNOSIS — R482 Apraxia: Secondary | ICD-10-CM

## 2020-05-22 DIAGNOSIS — R4701 Aphasia: Secondary | ICD-10-CM | POA: Diagnosis not present

## 2020-05-22 NOTE — Therapy (Signed)
[facility] [address] [place], [place], [address] Phone: [phone]   Fax:  [phone]  Speech Language Pathology Treatment  Patient Details  Name: Tim Stephens MRN: [id number] Date of Birth: 22-Oct-1955 Referring Provider (SLP): Dr. Renato Gails   Encounter Date: 05/22/2020   End of Session - 05/22/20 1622    Visit Number 4    Number of Visits 25    Date for SLP Re-Evaluation 07/29/20    Authorization Type none    SLP Start Time 1450    SLP Stop Time  1530    SLP Time Calculation (min) 40 min    Activity Tolerance Patient tolerated treatment well           History reviewed. No pertinent past medical history.  History reviewed. No pertinent surgical history.  There were no vitals filed for this visit.   Subjective Assessment - 05/22/20 1455    Subjective "[name]" (pt's linguraphipca on seafood page)    Patient is accompained by: Family member   Irving Burton - stepdaughter                ADULT SLP TREATMENT - 05/22/20 1559      General Information   Behavior/Cognition Alert;Cooperative;Pleasant mood;Impulsive;Requires cueing      Treatment Provided   Treatment provided Cognitive-Linquistic      Cognitive-Linquistic Treatment   Treatment focused on Apraxia;Aphasia    Skilled Treatment Irving Burton confirmed a white board is at home to use with pt if necessary. SLP reminded her that key words assist pt in comprehension. [name] communicated via multimodal communication telling SLP which children live with him and [name] and which children do not. Irving Burton does - but she told SLP she does not really have much interaction with [name] during the day as she is working full time. SLP encouraged [name] and Irving Burton to have interaction 2-3x a week using the Lingraphica. SLP suggested some topics they could interact about including food, family members, and events Irving Burton may know about in Tim Stephens's life. Today, SLP assited Tim Stephens in adding  new icons to his device from the list he generated after last session. Initial mod cues usually faded to min cues occasionally for modifying current icons or making new icons.  [name] to make two new icons - "[employer]" and "scallops".       Assessment / Recommendations / Plan   Plan Continue with current plan of care      Progression Toward Goals   Progression toward goals Progressing toward goals            SLP Education - 05/22/20 1621    Education Details Irving Burton should engage pt using Linguraphica 2-3 times a week, topics to engage pt with, basic programming of device    Person(s) Educated Patient;Other (comment)   step daughter   Methods Explanation;Demonstration;Verbal cues    Comprehension Verbalized understanding;Returned demonstration;Verbal cues required;Need further instruction            SLP Short Term Goals - 05/22/20 1623      SLP SHORT TERM GOAL #1   Title [name] will ID 5 phrases he would like to use regularly and use visual and written cues to approximate these phrases with 75% intelligibility and usual min A    Time 3    Period Weeks    Status On-going      SLP SHORT TERM GOAL #2   Title [name] will use external aids to respond to 3 texts or Facebook  posts with pre scripted phrases/comments with rare min A    Time 3    Period Weeks    Status On-going      SLP SHORT TERM GOAL #3   Title Family and friends will use written cues (1-2 words) to facilitate auditory comprehension, resulting in 25% less communication breakdowns or requiring spouse assistance.    Time 3    Period Weeks    Status On-going      SLP SHORT TERM GOAL #4   Title Pt will use multimodal communication to answer 5 personally relevant open ended questions with usual mod A over 2 sessions    Time 3    Period Weeks    Status On-going            SLP Long Term Goals - 05/22/20 1624      SLP LONG TERM GOAL #1   Title Pt will use text to speech on smart phone to comprehend 2 sentence text or  Face Book comment/post 3x over 3 sessions with usual min A    Time 11    Period Weeks    Status On-going      SLP LONG TERM GOAL #2   Title Pt will generate 6 personally relevant phrases he would like to use on a regular basis to be 75% intelligible and carryover these phrases in 4 different situations or different people with occasional min A, including written cues    Time 11    Period Weeks    Status On-going      SLP LONG TERM GOAL #3   Title Pt will use multimodal AAC to particiate in 4 turns in conversation using 2-4 words using external aids, with friend with usual min A    Time 11    Period Weeks    Status On-going      SLP LONG TERM GOAL #4   Title [name] will use external aids to respond to 5 texts with 2-4 word phrases with occasional min A    Time 11    Period Weeks    Status On-going      SLP LONG TERM GOAL #5   Title Friends/family will use written, visual cues to A Tim Stephens in generating verbal responses to conversation/questions/orders in community settings 5x with rare min A            Plan - 05/22/20 1622    Clinical Impression Statement Chronic severe aphasia and verbal apraxia.[name] and his caregivers require ongoing speech therapy to maximize multimodal communication and partner training. Today SLP encouraged Tim Stephens's step daughter Irving Burton to engage pt 2-3 times/week using the Lingraphica and provided some ideas for topics to her. [name] has experienced isolation due to Covid, aphasia and physical disability. He endorses depression. ST to maximize communication to improve life particiation, including texting and replying on social media.    Speech Therapy Frequency 2x / week    Duration --   12 weeks or 25 visits   Treatment/Interventions Language facilitation;Environmental controls;Cueing hierarchy;SLP instruction and feedback;Compensatory strategies;Functional tasks;Cognitive reorganization;Internal/external aids;Multimodal communcation approach;Patient/family education;Other  (comment);Compensatory techniques    Potential to Achieve Goals Good    Potential Considerations Severity of impairments           Patient will benefit from skilled therapeutic intervention in order to improve the following deficits and impairments:   Aphasia  Verbal apraxia    Problem List There are no problems to display for this patient.   [facility] ,MS, CCC-SLP  05/22/2020, 4:36  PM  [facility] [address] [place], [place], [address] Phone: [phone]   Fax:  [phone]   Name: DANTHONY KENDRIX MRN: [id number] Date of Birth: 07/06/1956

## 2020-05-27 ENCOUNTER — Ambulatory Visit: Payer: Medicare Other

## 2020-05-27 ENCOUNTER — Other Ambulatory Visit: Payer: Self-pay

## 2020-05-27 DIAGNOSIS — R4701 Aphasia: Secondary | ICD-10-CM | POA: Diagnosis not present

## 2020-05-27 DIAGNOSIS — R482 Apraxia: Secondary | ICD-10-CM

## 2020-05-27 NOTE — Therapy (Signed)
[facility] [address] [place], [place], [address] Phone: [phone]   Fax:  [phone]  Speech Language Pathology Treatment  Patient Details  Name: Tim Stephens MRN: [id number] Date of Birth: 1956/03/17 Referring Provider (SLP): Dr. Renato Gails   Encounter Date: 05/27/2020   End of Session - 05/27/20 1707    Visit Number 5    Number of Visits 25    Date for SLP Re-Evaluation 07/29/20    Authorization Type none    SLP Start Time 1534    SLP Stop Time  1615    SLP Time Calculation (min) 41 min    Activity Tolerance Patient tolerated treatment well           History reviewed. No pertinent past medical history.  History reviewed. No pertinent surgical history.  There were no vitals filed for this visit.   Subjective Assessment - 05/27/20 1652    Subjective Wife attended with pt today.    Patient is accompained by: Family member   wife   Currently in Pain? Yes                 ADULT SLP TREATMENT - 05/27/20 1653      General Information   Behavior/Cognition Alert;Pleasant mood;Cooperative;Requires cueing      Treatment Provided   Treatment provided Cognitive-Linquistic      Cognitive-Linquistic Treatment   Treatment focused on Apraxia;Aphasia    Skilled Treatment [name] wife indicated that Lynder Parents is being used once every 3-4 months at home. SLP encouraged wife and pt to use deivce whenever talking about food choices instead of having pt make sign of eating and [name] "playing 20 questions." Pt indicated he and Irving Burton did not talk using Lingraphica at home 2-3x the past week as directed by SLP during previous appointment. SLP explained to pt and wife that the way to have more efficient communication was to use device - [name] stated that "20 questions" scenario occurs whenever [name] wants to tell her something that he needs. SLP assisted pt find photos online and capture a picture of 3 toiletries  he needs periodically in order to text them to his wife. Pt will require more guidance with this and SLP suggested [name] do one or two more of these with [name] at home.      Assessment / Recommendations / Plan   Plan Continue with current plan of care      Progression Toward Goals   Progression toward goals Not progressing toward goals (comment)   ? carryover of device use to home environment           SLP Education - 05/27/20 1706    Education Details how to capture pictures to text them to wife to communicate pt needs items, pt needs to use device at home    Person(s) Educated Patient;Spouse    Methods Explanation;Demonstration;Verbal cues    Comprehension Verbal cues required;Returned demonstration;Verbalized understanding;Need further instruction            SLP Short Term Goals - 05/27/20 1710      SLP SHORT TERM GOAL #1   Title [name] will ID 5 phrases he would like to use regularly and use visual and written cues to approximate these phrases with 75% intelligibility and usual min A    Time 2    Period Weeks    Status On-going      SLP SHORT TERM GOAL #2   Title [name] will use external aids  to respond to 3 texts or Facebook posts with pre scripted phrases/comments with rare min A    Time 2    Period Weeks    Status On-going      SLP SHORT TERM GOAL #3   Title Family and friends will use written cues (1-2 words) to facilitate auditory comprehension, resulting in 25% less communication breakdowns or requiring spouse assistance.    Time 2    Period Weeks    Status On-going      SLP SHORT TERM GOAL #4   Title Pt will use multimodal communication to answer 5 personally relevant open ended questions with usual mod A over 2 sessions    Time 2    Period Weeks    Status On-going            SLP Long Term Goals - 05/27/20 1709      SLP LONG TERM GOAL #1   Title Pt will use text to speech on smart phone to comprehend 2 sentence text or Face Book comment/post 3x over 3  sessions with usual min A    Time 10    Period Weeks    Status On-going      SLP LONG TERM GOAL #2   Title Pt will generate 6 personally relevant phrases he would like to use on a regular basis to be 75% intelligible and carryover these phrases in 4 different situations or different people with occasional min A, including written cues    Time 10    Period Weeks    Status On-going      SLP LONG TERM GOAL #3   Title Pt will use multimodal AAC to particiate in 4 turns in conversation using 2-4 words using external aids, with friend with usual min A    Time 10    Period Weeks    Status On-going      SLP LONG TERM GOAL #4   Title [name] will use external aids to respond to 5 texts with 2-4 word phrases with occasional min A    Time 10    Period Weeks    Status On-going            Plan - 05/27/20 1707    Clinical Impression Statement Chronic severe aphasia and verbal apraxia.[name] and his caregivers require ongoing speech therapy to maximize multimodal communication and partner training. Today SLP encouraged pt to use the device at home as it is not being used currently in any functional situations. [name] has experienced isolation due to Covid, aphasia and physical disability. He endorses depression. ST to maximize communication to improve life particiation, including texting and replying on social media.    Speech Therapy Frequency 2x / week    Duration --   12 weeks or 25 visits   Treatment/Interventions Language facilitation;Environmental controls;Cueing hierarchy;SLP instruction and feedback;Compensatory strategies;Functional tasks;Cognitive reorganization;Internal/external aids;Multimodal communcation approach;Patient/family education;Other (comment);Compensatory techniques    Potential to Achieve Goals Good    Potential Considerations Severity of impairments           Patient will benefit from skilled therapeutic intervention in order to improve the following deficits and impairments:    Aphasia  Verbal apraxia    Problem List There are no problems to display for this patient.   [facility] ,MS, CCC-SLP  05/27/2020, 5:10 PM  [facility] [address] [place], [place], [address] Phone: [phone]   Fax:  [phone]   Name: Tim Stephens MRN: [id number]  Date of Birth: Nov 26, 1955

## 2020-06-01 ENCOUNTER — Other Ambulatory Visit: Payer: Self-pay

## 2020-06-01 ENCOUNTER — Ambulatory Visit: Payer: Medicare Other | Admitting: Speech Pathology

## 2020-06-01 DIAGNOSIS — R482 Apraxia: Secondary | ICD-10-CM

## 2020-06-01 DIAGNOSIS — R4701 Aphasia: Secondary | ICD-10-CM | POA: Diagnosis not present

## 2020-06-01 NOTE — Patient Instructions (Addendum)
   Add 5 restaurants in your restaurant icon  Play with Click AAC app when you're out to tell [name]  Play around with the house icon   Call [facility] 9-1-1 [phone] and let them know you have aphasia and may not be able to communicate easily in an emergency  To add a folder to an icon go to change icon, edit  options, customize

## 2020-06-01 NOTE — Therapy (Signed)
[facility] [address] [place], [place], [address] Phone: [phone]   Fax:  [phone]  Speech Language Pathology Treatment  Patient Details  Name: Tim Stephens MRN: [id number] Date of Birth: 05-06-1956 Referring Provider (SLP): Dr. Renato Gails   Encounter Date: 06/01/2020   End of Session - 06/01/20 1215    Visit Number 6    Number of Visits 25    Date for SLP Re-Evaluation 07/29/20           No past medical history on file.  No past surgical history on file.  There were no vitals filed for this visit.          ADULT SLP TREATMENT - 06/01/20 1156      General Information   Behavior/Cognition Alert;Pleasant mood;Cooperative;Requires cueing      Treatment Provided   Treatment provided Cognitive-Linquistic      Cognitive-Linquistic Treatment   Treatment focused on Aphasia;Apraxia;Patient/family/caregiver education    Skilled Treatment Tim Stephens arrives with SGD (speech generating device) at 15% charge and phone at 7% charge. He had list of toiletries and foods, but did not add them to device. Today, he required occasional min verbal cues to recall steps to add icons and consistent written cues to spell/type word in search. He wanted to focus on seafood and added 3 items. I demonstrated house icon and Tim Stephens located items in bathrrom (medicine cabinet and sink cabinet) for toiletries. He identified 2 icons to add there. Introduced Academic librarian to [employer] and added to his phone before it died. Demonstrated use of it on my phone. Tim Stephens is to take a photo of his Thanksgiving meal to communiate what he ate with ST next session.       Assessment / Recommendations / Plan   Plan Continue with current plan of care      Progression Toward Goals   Progression toward goals Progressing toward goals            SLP Education - 06/01/20 1210    Education Details use of Click AAC app and take photos on phone as  well, add icons, customize icon for folder    Person(s) Educated Patient;Spouse    Methods Explanation;Demonstration;Verbal cues    Comprehension Returned demonstration;Verbal cues required;Other (comment)   written cues           SLP Short Term Goals - 06/01/20 1213      SLP SHORT TERM GOAL #1   Title Tim Stephens will ID 5 phrases he would like to use regularly and use visual and written cues to approximate these phrases with 75% intelligibility and usual min A    Time 1    Period Weeks    Status On-going      SLP SHORT TERM GOAL #2   Title Tim Stephens will use external aids to respond to 3 texts or Facebook posts with pre scripted phrases/comments with rare min A    Time 1    Period Weeks    Status On-going      SLP SHORT TERM GOAL #3   Title Family and friends will use written cues (1-2 words) to facilitate auditory comprehension, resulting in 25% less communication breakdowns or requiring spouse assistance.    Time 1    Period Weeks    Status On-going      SLP SHORT TERM GOAL #4   Title Pt will use multimodal communication to answer 5 personally relevant open ended questions with usual  mod A over 2 sessions    Time 1    Period Weeks    Status On-going            SLP Long Term Goals - 06/01/20 1214      SLP LONG TERM GOAL #1   Title Pt will use text to speech on smart phone to comprehend 2 sentence text or Face Book comment/post 3x over 3 sessions with usual min A    Time 9    Period Weeks    Status On-going      SLP LONG TERM GOAL #2   Title Pt will generate 6 personally relevant phrases he would like to use on a regular basis to be 75% intelligible and carryover these phrases in 4 different situations or different people with occasional min A, including written cues    Time 9    Period Weeks    Status On-going      SLP LONG TERM GOAL #3   Title Pt will use multimodal AAC to particiate in 4 turns in conversation using 2-4 words using external aids, with friend with usual min  A    Time 9    Period Weeks    Status On-going      SLP LONG TERM GOAL #4   Title Tim Stephens will use external aids to respond to 5 texts with 2-4 word phrases with occasional min A    Time 9    Period Weeks    Status On-going            Plan - 06/01/20 1212    Clinical Impression Statement Chronic severe aphasia and verbal apraxia.Tim Stephens and his caregivers require ongoing speech therapy to maximize multimodal communication and partner training. SLP encouraged pt to use the device at home as it is not being used currently in any functional situations. Tim Stephens has experienced isolation due to Covid, aphasia and physical disability. He endorses depression. ST to maximize communication to improve life particiation, including texting and replying on social media.    Speech Therapy Frequency 2x / week    Duration 12 weeks   or 25 visits   Treatment/Interventions Language facilitation;Environmental controls;Cueing hierarchy;SLP instruction and feedback;Compensatory strategies;Functional tasks;Cognitive reorganization;Internal/external aids;Multimodal communcation approach;Patient/family education;Other (comment);Compensatory techniques    Potential to Achieve Goals Good    Potential Considerations Severity of impairments    Consulted and Agree with Plan of Care Patient;Family member/caregiver    Family Member Consulted spouse, Tim Stephens           Patient will benefit from skilled therapeutic intervention in order to improve the following deficits and impairments:   Aphasia  Verbal apraxia    Problem List There are no problems to display for this patient.   Tim Stephens, Radene Journey MS, CCC-SLP 06/01/2020, 12:16 PM  [facility] [address] [place], [place], [address] Phone: [phone]   Fax:  [phone]   Name: Tim Stephens MRN: [id number] Date of Birth: November 01, 1955

## 2020-06-10 ENCOUNTER — Other Ambulatory Visit: Payer: Self-pay

## 2020-06-10 ENCOUNTER — Ambulatory Visit: Payer: Medicare Other | Attending: Physical Medicine & Rehabilitation

## 2020-06-10 DIAGNOSIS — R4701 Aphasia: Secondary | ICD-10-CM | POA: Diagnosis present

## 2020-06-10 DIAGNOSIS — R482 Apraxia: Secondary | ICD-10-CM

## 2020-06-10 NOTE — Therapy (Signed)
[facility] [address] [place], [place], [address] Phone: [phone]   Fax:  [phone]  Speech Language Pathology Treatment  Patient Details  Name: Tim Stephens MRN: [id number] Date of Birth: 01-03-1956 Referring Provider (SLP): Dr. Levada Schilling   Encounter Date: 06/10/2020   End of Session - 06/10/20 1703    Visit Number 7    Number of Visits 25    Date for SLP Re-Evaluation 07/29/20    SLP Start Time 6301    SLP Stop Time  1620    SLP Time Calculation (min) 44 min    Activity Tolerance Patient tolerated treatment well           No past medical history on file.  No past surgical history on file.  There were no vitals filed for this visit.   Subjective Assessment - 06/10/20 1654    Subjective Pt: "How you?"  Wife:"(Tim Stephens,) You didn't look at those websites yet?" (re: adaptive hunting/fishing websites)    Patient is accompained by: Family member   wife Tim Stephens                ADULT SLP TREATMENT - 06/10/20 1654      General Information   Behavior/Cognition Alert;Pleasant mood;Cooperative;Requires cueing      Treatment Provided   Treatment provided Cognitive-Linquistic      Cognitive-Linquistic Treatment   Treatment focused on Apraxia;Aphasia    Skilled Treatment SGD and phone both charged. Tim Stephens/family did not perform homework with Click AAC app introduced last session. Pt did not have exercise chart filled out -indicated he forgot it was there in binder. Wife stated she has asked pt to empty dishwasher and fill with dirty dishes each day but pt is "not the best" about completing the tasks. SLP suggested a checklist/to-do list and provided this and 4 copies to pt/wife to use at home. SLP suggested pt put this on refrigerator and check off as he completes tasks each day (exercise is one of the tasks included on checklist). Today, SLP also collaborated with Tim Stephens and Tim Stephens to generate 4-5 phrases pt can  share on Facebook. Pt req'd occasional min A initially on how to generate phrases on his Lingraphica but 4 phrases later he was independent with very minimal extra time. SLP shared wiht pt and wife that eventually it may be more feasible for pt to have phrases on his phone and copy and paste but this would be more steps for pt to do - SLP told wife pt would need assistance at begining of this process for 3-4 consecutive days of 15 minutes/day practice with wife to make the sequence routine. Using the phrases generated today on his SGD, pt copied them 100% success onto friends' posts. [name] wife confirmed that Friday was pt's final appointment and SLP confirmed this.       Assessment / Recommendations / Plan   Plan Continue with current plan of care   likely d/c next session     Progression Toward Goals   Progression toward goals Progressing toward goals            SLP Education - 06/10/20 1702    Education Details checklist may be helpful for pt to complete tasks during the day, Audible subscription may be better than pt reading novels - he can slow the rate of speech with Audible to enhance listening comprehension    Person(s) Educated Spouse;Patient    Methods Explanation;Handout    Comprehension Verbalized understanding  SLP Short Term Goals - 06/10/20 1704      SLP SHORT TERM GOAL #1   Title Tim Stephens will ID 5 phrases he would like to use regularly and use visual and written cues to approximate these phrases with 75% intelligibility and usual min A    Time --    Period --    Status Not Met      SLP SHORT TERM GOAL #2   Title Tim Stephens will use external aids to respond to 3 texts or Facebook posts with pre scripted phrases/comments with rare min A    Time --    Period --    Status Partially Met      SLP SHORT TERM GOAL #3   Title Family and friends will use written cues (1-2 words) to facilitate auditory comprehension, resulting in 25% less communication breakdowns or requiring  spouse assistance.    Time --    Period --    Status Not Met      SLP SHORT TERM GOAL #4   Title Pt will use multimodal communication to answer 5 personally relevant open ended questions with usual mod A over 2 sessions    Time --    Period --    Status Not Met            SLP Long Term Goals - 06/10/20 1706      SLP LONG TERM GOAL #1   Title Pt will use text to speech on smart phone to comprehend 2 sentence text or Face Book comment/post 3x over 3 sessions with usual min A    Time 8    Period Weeks    Status On-going      SLP LONG TERM GOAL #2   Title Pt will generate 6 personally relevant phrases he would like to use on a regular basis to be 75% intelligible and carryover these phrases in 4 different situations or different people with occasional min A, including written cues    Time 8    Period Weeks    Status On-going      SLP LONG TERM GOAL #3   Title Pt will use multimodal AAC to particiate in 4 turns in conversation using 2-4 words using external aids, with friend with usual min A    Time 8    Period Weeks    Status On-going      SLP LONG TERM GOAL #4   Title Tim Stephens will use external aids to respond to 5 texts with 2-4 word phrases with occasional min A    Time 8    Period Weeks    Status On-going            Plan - 06/10/20 1703    Clinical Impression Statement Chronic severe aphasia and verbal apraxia.Tim Stephens and his caregivers require ongoing speech therapy to maximize multimodal communication and partner training. See note from today for more details. Tim Stephens has experienced isolation due to Covid, aphasia and physical disability. He endorses depression. ST will be copmleted next session due to decr'd participation and/or consistency at home with SGD use. Pt has some improved life particiation, including texting, and replying on social media.    Speech Therapy Frequency 2x / week    Duration 12 weeks   or 25 visits   Treatment/Interventions Language  facilitation;Environmental controls;Cueing hierarchy;SLP instruction and feedback;Compensatory strategies;Functional tasks;Cognitive reorganization;Internal/external aids;Multimodal communcation approach;Patient/family education;Other (comment);Compensatory techniques    Potential to Achieve Goals Good    Potential Considerations Severity of impairments  Consulted and Agree with Plan of Care Patient;Family member/caregiver    Family Member Consulted spouse, Tim Stephens           Patient will benefit from skilled therapeutic intervention in order to improve the following deficits and impairments:   Aphasia  Verbal apraxia    Problem List There are no problems to display for this patient.   [facility] ,[place], [place]  06/10/2020, 5:14 PM  [place] [address] [place], [place], [address] Phone: [phone]   Fax:  [phone]   Name: Tim Stephens MRN: [id number] Date of Birth: [birth year]

## 2020-06-12 ENCOUNTER — Other Ambulatory Visit: Payer: Self-pay

## 2020-06-12 ENCOUNTER — Ambulatory Visit: Payer: Medicare Other

## 2020-06-12 DIAGNOSIS — R4701 Aphasia: Secondary | ICD-10-CM | POA: Diagnosis not present

## 2020-06-12 DIAGNOSIS — R482 Apraxia: Secondary | ICD-10-CM

## 2020-06-12 NOTE — Therapy (Signed)
[facility] [address] [place], [place], [address] Phone: [phone]   Fax:  [phone]  Speech Language Pathology Treatment/Discharge Summary  Patient Details  Name: Tim Stephens MRN: [id number] Date of Birth: [birth year] Referring Provider (SLP): Dr. Levada Stephens   SPEECH THERAPY DISCHARGE SUMMARY  Visits from Start of Care: 8  Current functional level related to goals / functional outcomes: See goals below. SLP was able to communicate some ideas for improving use with [employer], and with compensations for improving communication between pt and wife, with intermittent carryover. In last session today wife shared that she thinks she and pt need to work on some things as a couple. SLP encouraged wife that Tim Stephens may be more beneficial after that time.    Remaining deficits: Deficits largely unchanged from evaluation.   Education / Equipment: Therapist, music device use and programming.    Plan: Patient agrees to discharge.  Patient goals were partially met. Patient is being discharged due to the patient's request.  ?????and intermittent carryover at home with SGD.       Encounter Date: 06/12/2020   End of Session - 06/12/20 1608    Visit Number 8    Number of Visits 25    Date for SLP Re-Evaluation 07/29/20    SLP Start Time 1450    SLP Stop Time  1530    SLP Time Calculation (min) 40 min    Activity Tolerance Patient tolerated treatment well           No past medical history on file.  No past surgical history on file.  There were no vitals filed for this visit.   Subjective Assessment - 06/12/20 1554    Subjective "How you?"    Patient is accompained by: Family member   Tim Stephens - wife   Currently in Pain? No/denies                 ADULT SLP TREATMENT - 06/12/20 1556      General Information   Behavior/Cognition Alert;Pleasant mood;Cooperative;Requires cueing      Treatment  Provided   Treatment provided Cognitive-Linquistic      Cognitive-Linquistic Treatment   Treatment focused on Aphasia;Apraxia    Skilled Treatment Pt with gibberish interspersed with real words but not always applicable to subject for spontaneous speech today in discussion with wife. Pt did not appear aware of his verbal errors >30% of the time. Pt had other sentenes typed into his "text and FB messages" folder however 2/4 were  not applicable for FB nor for text. Pt req'd iniital mod cues faded to min cues to delete these icons. SLP used this to highlight to wife that she may need to slow down her verbal communication or simplify to ensure pt comprehension, and SLP explained to pt that if he doesn't understand wife he needs to let her know by facial expression or by simple verbal means so she does not assume pt understands something he did not understand. Wife asked re: pt's prognosis for cont'd recovery. SLP shared pt may improve but likely not as noticeable, from improved brain function. Likely improvement will be from compensating differently/better, like SLP suggested wife with simpler language/slower rate, and Tim Stephens indiating to wife when he does not understand instead of impulsively saying "yah". Wife shared that she/pt may need a neuropsych referral to work on some details of their relationship prior to any possible return to [place].        Assessment /  Recommendations / Plan   Plan Discharge SLP treatment due to (comment)   pt/wife pursuing neuropsych     Progression Toward Goals   Progression toward goals --   d/c day - see goals           SLP Education - 06/12/20 1607    Education Details neuropscyh services for counseling for change post -CVA    Person(s) Educated Patient;Spouse    Methods Explanation    Comprehension Verbalized understanding;Need further instruction            SLP Short Term Goals - 06/10/20 1704      SLP SHORT TERM GOAL #1   Title Tim Stephens will ID 5 phrases he would  like to use regularly and use visual and written cues to approximate these phrases with 75% intelligibility and usual min A    Time --    Period --    Status Not Met      SLP SHORT TERM GOAL #2   Title Tim Stephens will use external aids to respond to 3 texts or Facebook posts with pre scripted phrases/comments with rare min A    Time --    Period --    Status Partially Met      SLP SHORT TERM GOAL #3   Title Family and friends will use written cues (1-2 words) to facilitate auditory comprehension, resulting in 25% less communication breakdowns or requiring spouse assistance.    Time --    Period --    Status Not Met      SLP SHORT TERM GOAL #4   Title Pt will use multimodal communication to answer 5 personally relevant open ended questions with usual mod A over 2 sessions    Time --    Period --    Status Not Met            SLP Long Term Goals - 06/12/20 1611      SLP LONG TERM GOAL #1   Title Pt will use text to speech on smart phone to comprehend 2 sentence text or Face Book comment/post 3x over 3 sessions with usual min A    Status Deferred      SLP LONG TERM GOAL #2   Title Pt will generate 6 personally relevant phrases he would like to use on a regular basis to be 75% intelligible and carryover these phrases in 4 different situations or different people with occasional min A, including written cues    Status Partially Met   2 situations (Facebook and text)     SLP LONG TERM GOAL #3   Title Pt will use multimodal AAC to particiate in 4 turns in conversation using 2-4 words using external aids, with friend with usual min A    Status Not Met      SLP LONG TERM GOAL #4   Title Tim Stephens will use external aids to respond to 5 texts with 2-4 word phrases with occasional min A    Status Partially Met            Plan - 06/12/20 1608    Clinical Impression Statement Chronic severe aphasia and verbal apraxia.Tim Stephens and his caregivers require ongoing speech therapy to maximize multimodal  communication and partner training. See note from today for more details. Tim Stephens's wife shared today that they need to work on some things as a couple due to changes post-CVA. SLP encouraged pt/wife that ST may be more of a benefit.after this time. Tim Stephens has experienced  isolation due to Covid, aphasia and physical disability. He endorses depression. ST will be d/c'd next session due to decr'd participation and/or consistency at home with SGD use, as well as neuropsych needs for the pt and his wife.    Speech Therapy Frequency 2x / week    Duration 12 weeks   or 25 visits   Treatment/Interventions Language facilitation;Environmental controls;Cueing hierarchy;SLP instruction and feedback;Compensatory strategies;Functional tasks;Cognitive reorganization;Internal/external aids;Multimodal communcation approach;Patient/family education;Other (comment);Compensatory techniques    Potential to Achieve Goals Good    Potential Considerations Severity of impairments    Consulted and Agree with Plan of Care Patient;Family member/caregiver    Family Member Consulted spouse, Tim Stephens           Patient will benefit from skilled therapeutic intervention in order to improve the following deficits and impairments:   Verbal apraxia  Aphasia    Problem List There are no problems to display for this patient.   [facility] ,[place], [place]  06/12/2020, 4:14 PM  [facility] [address] [place], [place], [address] Phone: [phone]   Fax:  [phone]   Name: Tim Stephens MRN: [id number] Date of Birth: [birth year]

## 2020-06-12 NOTE — Patient Instructions (Signed)
  Neuropsych services: Dr. Kieth Brightly and Dr. Vella Kohler [facility] Dr. Milbert Coulter [facility]

## 2020-08-11 ENCOUNTER — Encounter: Payer: Self-pay | Admitting: Occupational Therapy

## 2020-08-11 NOTE — Therapy (Signed)
[facility] [address] [place], [place], [address] Phone: [phone]   Fax:  [phone]  August 11, 2020    No Recipients  Occupational Therapy Discharge Summary   Patient: Tim Stephens MRN: [id number] Date of Birth: 28-Apr-1956  Diagnosis: No diagnosis found.  No data recorded   Patient was placed on hold for OT with plan to return in new benefit year.   Patient has not returned to OT.      Sincerely,  Aubra Pappalardo, Lanice Shirts, OT  CC No Recipients  [facility] [address] [place], [place], [address] Phone: [phone]   Fax:  [phone]  Patient: Tim Stephens MRN: [id number] Date of Birth: 1956-03-27

## 2020-09-08 ENCOUNTER — Encounter: Payer: Self-pay | Admitting: Speech Pathology

## 2020-09-11 ENCOUNTER — Encounter: Payer: Self-pay | Admitting: Speech Pathology
